# Patient Record
Sex: Male | Born: 1970 | Race: White | Hispanic: No | Marital: Married
Health system: Midwestern US, Community
[De-identification: ages and names within clinical notes are randomized; demographics above are authoritative.]

## PROBLEM LIST (undated history)

## (undated) DIAGNOSIS — F259 Schizoaffective disorder, unspecified: Secondary | ICD-10-CM

## (undated) DIAGNOSIS — F258 Other schizoaffective disorders: Secondary | ICD-10-CM

## (undated) DIAGNOSIS — E119 Type 2 diabetes mellitus without complications: Secondary | ICD-10-CM

## (undated) DIAGNOSIS — I10 Essential (primary) hypertension: Secondary | ICD-10-CM

## (undated) DIAGNOSIS — Z1211 Encounter for screening for malignant neoplasm of colon: Secondary | ICD-10-CM

## (undated) DIAGNOSIS — N400 Enlarged prostate without lower urinary tract symptoms: Secondary | ICD-10-CM

## (undated) DIAGNOSIS — K219 Gastro-esophageal reflux disease without esophagitis: Secondary | ICD-10-CM

## (undated) DIAGNOSIS — W19XXXA Unspecified fall, initial encounter: Secondary | ICD-10-CM

## (undated) DIAGNOSIS — Z9884 Bariatric surgery status: Secondary | ICD-10-CM

## (undated) DIAGNOSIS — N401 Enlarged prostate with lower urinary tract symptoms: Secondary | ICD-10-CM

## (undated) DIAGNOSIS — N289 Disorder of kidney and ureter, unspecified: Secondary | ICD-10-CM

## (undated) DIAGNOSIS — C642 Malignant neoplasm of left kidney, except renal pelvis: Secondary | ICD-10-CM

## (undated) DIAGNOSIS — R911 Solitary pulmonary nodule: Secondary | ICD-10-CM

## (undated) DIAGNOSIS — N2889 Other specified disorders of kidney and ureter: Secondary | ICD-10-CM

## (undated) HISTORY — DX: Essential (primary) hypertension: I10

## (undated) HISTORY — DX: Schizoaffective disorder, unspecified: F25.9

## (undated) HISTORY — DX: Type 2 diabetes mellitus without complications: E11.9

## (undated) HISTORY — DX: Other schizoaffective disorders: F25.8

## (undated) HISTORY — PX: OTHER SURGICAL HISTORY: SHX169

## (undated) HISTORY — PX: GASTRIC BYPASS: SHX52

---

## 2006-07-02 ENCOUNTER — Other Ambulatory Visit: Payer: Self-pay

## 2006-07-02 ENCOUNTER — Inpatient Hospital Stay: Payer: Self-pay | Admitting: Internal Medicine

## 2006-07-16 ENCOUNTER — Emergency Department: Payer: Self-pay | Admitting: Emergency Medicine

## 2006-08-19 ENCOUNTER — Other Ambulatory Visit: Payer: Self-pay

## 2006-08-19 ENCOUNTER — Emergency Department: Payer: Self-pay | Admitting: Emergency Medicine

## 2006-08-26 ENCOUNTER — Emergency Department: Payer: Self-pay | Admitting: Emergency Medicine

## 2007-02-09 ENCOUNTER — Emergency Department: Payer: Self-pay | Admitting: Emergency Medicine

## 2007-05-04 ENCOUNTER — Emergency Department: Payer: Self-pay | Admitting: Unknown Physician Specialty

## 2007-12-02 ENCOUNTER — Emergency Department: Payer: Self-pay | Admitting: Emergency Medicine

## 2008-05-29 ENCOUNTER — Emergency Department: Payer: Self-pay | Admitting: Emergency Medicine

## 2012-02-26 ENCOUNTER — Observation Stay: Payer: Self-pay | Admitting: Internal Medicine

## 2012-02-26 LAB — CBC
HCT: 41.1 % (ref 40.0–52.0)
HGB: 14 g/dL (ref 13.0–18.0)
MCH: 29.3 pg (ref 26.0–34.0)
MCHC: 34 g/dL (ref 32.0–36.0)
Platelet: 165 10*3/uL (ref 150–440)
RBC: 4.78 10*6/uL (ref 4.40–5.90)
RDW: 13.9 % (ref 11.5–14.5)
WBC: 7.1 10*3/uL (ref 3.8–10.6)

## 2012-02-26 LAB — COMPREHENSIVE METABOLIC PANEL
Albumin: 4.2 g/dL (ref 3.4–5.0)
Alkaline Phosphatase: 111 U/L (ref 50–136)
Anion Gap: 8 (ref 7–16)
Calcium, Total: 9.1 mg/dL (ref 8.5–10.1)
Co2: 25 mmol/L (ref 21–32)
Creatinine: 0.6 mg/dL (ref 0.60–1.30)
EGFR (Non-African Amer.): >60
Potassium: 3.8 mmol/L (ref 3.5–5.1)
Sodium: 139 mmol/L (ref 136–145)

## 2012-02-26 LAB — CK TOTAL AND CKMB (NOT AT ARMC)
CK, Total: 71 U/L (ref 35–232)
CK-MB: <0.5 ng/mL — ABNORMAL LOW (ref 0.5–3.6)

## 2012-02-26 LAB — TROPONIN I: Troponin-I: <0.02 ng/mL

## 2012-02-26 LAB — PROTIME-INR
INR: 1
Prothrombin Time: 13.1 secs (ref 11.5–14.7)

## 2013-02-22 ENCOUNTER — Emergency Department: Payer: Self-pay | Admitting: Emergency Medicine

## 2013-02-22 LAB — BASIC METABOLIC PANEL
BUN: 8 mg/dL (ref 7–18)
Calcium, Total: 8.6 mg/dL (ref 8.5–10.1)
Chloride: 104 mmol/L (ref 98–107)
Creatinine: 0.73 mg/dL (ref 0.60–1.30)
EGFR (African American): >60
EGFR (Non-African Amer.): >60
Glucose: 112 mg/dL — ABNORMAL HIGH (ref 65–99)
Potassium: 3.3 mmol/L — ABNORMAL LOW (ref 3.5–5.1)
Sodium: 139 mmol/L (ref 136–145)

## 2013-02-22 LAB — CK TOTAL AND CKMB (NOT AT ARMC)
CK, Total: 46 U/L (ref 35–232)
CK-MB: <0.5 ng/mL — ABNORMAL LOW (ref 0.5–3.6)

## 2013-02-22 LAB — CBC
HCT: 38 % — ABNORMAL LOW (ref 40.0–52.0)
HGB: 13 g/dL (ref 13.0–18.0)
MCV: 86 fL (ref 80–100)
Platelet: 153 10*3/uL (ref 150–440)
RBC: 4.42 10*6/uL (ref 4.40–5.90)
RDW: 14.4 % (ref 11.5–14.5)
WBC: 7.7 10*3/uL (ref 3.8–10.6)

## 2013-06-20 LAB — D-DIMER, QUANTITATIVE: D-Dimer, Quant: 0.95 ug/mL (FEU) — ABNORMAL HIGH (ref 0.01–0.50)

## 2013-06-20 LAB — CBC WITH AUTOMATED DIFF
BASOPHILS: 0.2 % (ref 0–3)
EOSINOPHILS: 5.5 % — ABNORMAL HIGH (ref 0–5)
HCT: 42.7 % (ref 37.0–50.0)
HGB: 14.3 gm/dl (ref 12.4–17.2)
IMMATURE GRANULOCYTES: 0.2 % (ref 0.0–3.0)
LYMPHOCYTES: 15.4 % — ABNORMAL LOW (ref 28–48)
MCH: 28.3 pg (ref 23.0–34.6)
MCHC: 33.5 gm/dl (ref 30.0–36.0)
MCV: 84.6 fL (ref 80.0–98.0)
MONOCYTES: 5.3 % (ref 1–13)
MPV: 10.9 fL — ABNORMAL HIGH (ref 6.0–10.0)
NEUTROPHILS: 73.4 % — ABNORMAL HIGH (ref 34–64)
NRBC: 0 (ref 0–0)
PLATELET: 186 10*3/uL (ref 140–450)
RBC: 5.05 M/uL (ref 3.80–5.70)
RDW-SD: 45.1 — ABNORMAL HIGH (ref 35.1–43.9)
WBC: 9.3 10*3/uL (ref 4.0–11.0)

## 2013-06-20 LAB — METABOLIC PANEL, COMPREHENSIVE
ALT (SGPT): 21 U/L (ref 12–78)
AST (SGOT): 13 U/L — ABNORMAL LOW (ref 15–37)
Albumin: 4.3 gm/dl (ref 3.4–5.0)
Alk. phosphatase: 132 U/L — ABNORMAL HIGH (ref 45–117)
BUN: 14 mg/dl (ref 7–25)
Bilirubin, total: 0.7 mg/dl (ref 0.2–1.0)
CO2: 26 mEq/L (ref 21–32)
Calcium: 9 mg/dl (ref 8.5–10.1)
Chloride: 104 mEq/L (ref 98–107)
Creatinine: 0.9 mg/dl (ref 0.6–1.3)
GFR est AA: >60
GFR est non-AA: >60
Glucose: 113 mg/dl — ABNORMAL HIGH (ref 74–106)
Potassium: 3.7 mEq/L (ref 3.5–5.1)
Protein, total: 7.9 gm/dl (ref 6.4–8.2)
Sodium: 140 mEq/L (ref 136–145)

## 2013-06-20 LAB — TROPONIN I: Troponin-I: <0.015 ng/ml (ref 0.00–0.09)

## 2013-06-20 LAB — D DIMER: D DIMER: 0.95 ug/mL (FEU) — ABNORMAL HIGH (ref 0.01–0.50)

## 2013-06-20 LAB — CKMB PROFILE
CK - MB: <0.5 ng/ml (ref 0.0–3.6)
CK-MB Index: 0.9 % (ref 0.0–4.9)
CK: 43 U/L (ref 39–308)

## 2013-06-20 LAB — LIPASE: Lipase: 248 U/L (ref 73–393)

## 2013-06-28 NOTE — ED Provider Notes (Addendum)
[facility]  EMERGENCY DEPARTMENT TREATMENT REPORT  NAME:  Jordan Weiss  SEX:   M  ADMIT: 06/20/2013  DOB:   January 27, 1971  MR#    [id number]  ROOM:    TIME DICTATED: 12 19 PM  ACCT#  [id number]        CHIEF COMPLAINT:  Epigastric abdominal pain, chest pain.    HISTORY OF PRESENT ILLNESS:  A 43 year old male presents with complaints of epigastric abdominal pain  radiating up into his chest, started yesterday.  The patient does have some  nausea, denies vomiting or diarrhea.  The patient complaining of lower back  pain as well.  States that he fell this morning secondary to the pain and that  lead to his back pain.  The patient states the pain has been constant since  onset and sharp.  No fevers or chills, no shortness of breath.    REVIEW OF SYSTEMS:  CONSTITUTIONAL:  Denies fevers or chills.  VISUAL:  No visual complaints.  ENT:  No congestion.  RESPIRATORY:  No shortness of breath.  CARDIOVASCULAR:  Precordial chest pain.  GASTROINTESTINAL:  Epigastric abdominal pain.  MUSCULOSKELETAL:  Mid to lower back pain.  NEUROLOGICAL:  No weakness or numbness.  Denies complaints in all other systems.    PAST MEDICAL HISTORY:  Degenerative disk disease, hypertension.    PAST SURGICAL HISTORY:  Skin grafts, gastric bypass surgery.    SOCIAL HISTORY:  Smokes cigarettes.    MEDICATIONS:  Reviewed on Ibex.    ALLERGIES:  HEPARIN.    PHYSICAL EXAMINATION:  GENERAL:  Well-developed male, awake, alert.  VITAL SIGNS:  Blood pressure 179/95, heart rate 116, respiratory rate 20,  temperature 97.5, oxygen saturation 100% on room air.  HEENT:  Anicteric sclerae.  Nasopharynx unremarkable.  Oropharynx clear.  NECK:  Supple.  LUNGS:  Clear to auscultation.  CARDIOVASCULAR:  Regular rate and rhythm.  The patient does have some mild  lower anterior chest discomfort.  ABDOMEN:  Soft, tender in the epigastrium without palpable masses, rebound or  guarding.  The rest of the abdomen is soft and nontender.   BACK:  Lumbar paraspinal tenderness.  No palpable bony abnormalities.  NEUROLOGIC:  Sensation and motor intact throughout all extremities.  Reflexes  equal and symmetric.  Motor strength is global throughout.  SKIN:  Unremarkable for acute abnormalities.     IMPRESSION:  Epigastric abdominal pain, chest pain.  We will obtain diagnostic studies.    DIAGNOSTIC STUDIES:  EKG:  Sinus bradycardia, no acute ST changes.  CT abdomen and pelvis obtained  revealing gastric bypass procedure, hepatic steatosis, bilateral renal cysts.  Chest x-ray obtained.  No acute abnormalities.  CPK profile is unremarkable.  CMP is unremarkable.  Troponin and CPK profile unremarkable.  D-dimer did come  back elevated at 0.95. Lipase is normal.  CBC is unremarkable.      The patient receiving intravenous fluids in the Emergency Department and  symptomatic treatment with morphine and Zofran.  On reevaluation, the patient  does feel much better.  He is comfortable in the Emergency Department.  I have  discussed the elevated D-dimer and need for further evaluation including CT  scan of his chest in light of the chest pain of unclear etiology and elevated  at the D-dimer.  I explained to him the risk of possible pulmonary embolism  and possible life-threatening complications related to this.  The patient  refuses to stay in the hospital for further evaluation.  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  I did discuss with him the need to follow  up as closely as possible for recheck, that he could always return to the  Emergency Department.  I gave him a referral to primary care, Dr. Annamaria Boots, as  well as a couple of GI groups, instructed him to watch his diet, avoid fatty  greasy foods and once again reiterated the need for him to have a CT of his   chest and further evaluate to rule out pulmonary embolism.    FINAL DIAGNOSES:  Abdominal pain evaluation, chest pain evaluation, elevated D-dimer.         ___________________  Joice Lofts MD  Dictated By: Marland Kitchen     My signature above authenticates this document and my orders, the final  diagnosis (es), discharge prescription (s), and instructions in the [facility] record.  Nursing notes have been reviewed by the physician/mid-level provider.    If you have any questions please contact [phone].    [facility]  D:06/28/2013 12:19:25  T: 06/28/2013 13:14:51  [id number]  Electronically Authenticated and Alison Stalling by:  Joya Gaskins. Sonny Masters, M.D. On 11/01/2013 09:23 AM EDT

## 2013-07-28 NOTE — ED Provider Notes (Signed)
[facility]  EMERGENCY DEPARTMENT TREATMENT REPORT  NAME:  Jordan Weiss  SEX:   M  ADMIT: 07/27/2013  DOB:   [birth year]  MR#    [id number]  ROOM:  HY86  TIME DICTATED: 02 9 AM  ACCT#  [id number]        PRIMARY CARE PHYSICIAN:  None.    TIME OF EVALUATION:  2201.    CHIEF COMPLAINT:  Chest pain.    HISTORY OF PRESENT ILLNESS:  The patient is a 43 year old male who presents with 18 hours of the anterior   left-sided sharp chest pain that radiates to his left shoulder.  It has been a   fairly constant pain since 12 to 18 hours prior to arrival.  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    States it has been 9 years since he has had an MI. Also he has not taken his   medications for the last 3 days.    REVIEW OF SYSTEMS:  CONSTITUTIONAL:  No fever, chills, or weight loss  EYES: No visual symptoms.  ENT: No sore throat, runny nose, or other URI symptoms.  ENDOCRINE: No diabetic symptoms.  HEMATOLOGIC/LYMPHATIC: No excessive bruising or lymph node swelling.  RESPIRATORY:  The patient does admit to shortness of breath accompanying his   chest pain.  CARDIOVASCULAR:  He has a left sternal border left anterior chest pain, sharp   in nature, radiating to left shoulder and down left arm times 12 to 18 hours   prior to arrival, fairly constant.  GASTROINTESTINAL:  Also he has nausea, no vomiting, no diarrhea.  GENITOURINARY: No dysuria, frequency, or urgency.  MUSCULOSKELETAL: No joint pain or swelling.  INTEGUMENTARY: No rashes.  NEUROLOGICAL: No headaches, sensory or motor symptoms.    PAST MEDICAL HISTORY:  Significant degenerative disk disease, hypertension, gastric bypass plastic   surgery to his hands.    SOCIAL HISTORY:  Smokes tobacco, consumes alcohol socially, denies drug use.  Lives at home.    FAMILY HISTORY:  Noncontributory.    CURRENT MEDICATIONS:  Reviewed in [facility].     ALLERGIES:  REVIEWED IN IBEX,    PHYSICAL EXAMINATION:  VITAL SIGNS:  Blood pressure 170/92, pulse 65, respirations 18, temperature   97.5, pain 10, O2 sat 100% on room air.  GENERAL APPEARANCE: Patient appears well developed and well nourished.   Appearance and behavior are age and situation appropriate.  HEENT:  Eyes:  Conjunctivae are mildly injected.  Pupils equal, symmetrical   and normally reactive. Mouth/throat: Surfaces of the pharynx, palate, and   tongue are pink, moist, and without lesions.   NECK:  Symmetrical.    RESPIRATORY: Clear and equal breath sounds. No respiratory distress,   tachypnea, or accessory muscle use.  CARDIOVASCULAR:  Heart is regular.  I did not auscultate any murmurs or rubs.    DP pulses 2+ and equal bilaterally. No peripheral edema or significant   varicosities.   CHEST:  Symmetrical without masses or tenderness.  NECK:  Symmetrical, mildly tender to palpation anteriorly both left and right.      ABDOMEN: Soft, nondistended, nontender.  No rebound or peritoneal signs.  No   flank tenderness.  MUSCULOSKELETAL:  Stance and gait appear normal.  SKIN:  Warm and dry without rashes.  NEUROLOGIC:  He is alert, oriented.  Motor strength equal  and symmetric.    INITIAL ASSESSMENT AND MANAGEMENT PLAN:  This is a new problem for the patient.     Nursing  notes were reviewed.    IMPRESSION/PLAN:    Patient with chest pain. Acute ischemic coronary disease must be considered   first, and the patient protected against the consequences of same, while other   etiologies (including infectious, pulmonary, gastrointestinal, and   musculoskeletal) are considered.     COURSE IN THE EMERGENCY ROOM:  EKG read Dr. Rigoberto Noel did not see any acute S-T segment or T wave abnormalities   that are consistent with acute ischemia or infarction.    Chest x-ray read by Dr. Rigoberto Noel, no acute pulmonary process.      He was given sublingual nitroglycerin and topical nitro here in the ER.  He    was given p.o. aspirin, IV Pepcid, IV Zofran, IV morphine, IV Tylenol.  His   CPK profile was normal.  Troponin I was normal, that was 2 sets, all normal.    Basic metabolic panel was unremarkable and CBC also unremarkable.    DIAGNOSIS:  Chest pain, precordial.  He was assigned to the observation unit for chest   pain protocol; however, at 230 apparently there was some sort of crisis going   on at home, so he is being discharged AMA from the observation unit, not   having completed his chest pain protocol.      EXAMINATION: Vital signs at 117 prior to discharge, blood pressure 157/93,   pulse 61, respirations 18, temperature 97.7, pain 3, O2 saturation 97% on room   air.    HEART: Regular, no murmurs or rubs.  CHEST:  Symmetrical, nontender.  ABDOMEN:  Soft, nondistended, nontender.    The patient was personally evaluated by myself and Dr. Rigoberto Noel, who agrees with   the above assessment and plan.    CONTINUATION BY Jordan Crumble, MD    The patient was seen with Jordan Organ, PA-C.  We have gone over the history and   physical exam together.      I went and saw and examined the patient and spoke to him myself.  He is 43 and   presents with precordial pain.  Agree with her management and plan.  He will   be placed in the observation unit for further testing and management.      ___________________  Jordan Weiss M.D.  Dictated By: Kelli Hope. Towns, PA-C    My signature above authenticates this document and my orders, the final  diagnosis (es), discharge prescription (s), and instructions in the [facility] record.  Nursing notes have been reviewed by the physician/mid-level provider.    If you have any questions please contact [phone].    [facility]  D:07/28/2013 02:39:51  T: 07/28/2013 10:56:34  [id number]  Authenticated by Hortense Ramal, M.D. On 08/11/2013 04:01:06 AM

## 2013-08-12 NOTE — ED Provider Notes (Signed)
[facility]  EMERGENCY DEPARTMENT TREATMENT REPORT  NAME:  Jordan Weiss  SEX:   M  ADMIT: 08/12/2013  DOB:   1971-06-07  MR#    [id number]  ROOM:  CD06  TIME DICTATED: 10 38 AM  ACCT#  [id number]    cc: Dr. Pablo Ledger     I hereby certify this patient for admission, based on medical necessity, as  noted below.    TIME OF EVALUATION:  0930    CHIEF COMPLAINT:  Suicidal ideation with possible overdose of trazodone and lithium.    PRIMARY CARE PHYSICIAN:   Dr. Pablo Ledger    HISTORY OF PRESENT ILLNESS:  This is a 43 year old male who tells me that he took an overdose of trazodone  and lithium,  approximately 20 trazodone, 100 mg each, and between 15 and 20   of  150 mg lithium approximately an hour and a half to 2 hours ago.  He was  actually brought in by a bail bondsman who had discovered him behind the  [employer] right across the street from the hospital and was bringing him in   on  a bail from back in September timeframe.  He states that when the patient got  into the car, he told the agent that he had taken the medication and so the  agent brought him over here for evaluation.  He denies taking any other  medications and did not drink any alcohol.  He says that he has had suicidal  ideation since he was 43 years old and psychiatric problems that he has been  treated for.  He has not been taking his medications, however, for the past  month.  He also has a history of hypertension and diabetes and has not taken  medication for that either.  During my evaluation, he asked me 3 or 4 times if  I would just let him die.  He otherwise denies any fevers.  No cough, no chest  pain or shortness of breath, no abdominal pain, no nausea, no vomiting or  diarrhea.    REVIEW OF SYSTEMS.  CONSTITUTIONAL:  No fever, chills, or weight loss.  EYES:   No visual symptoms.  ENT:  No sore throat, runny nose, or other URI symptoms.  ENDOCRINE:  No diabetic symptoms.   HEMATOLOGIC/LYMPHATIC:   No excessive bruising or lymph node swelling.  ALLERGIC/IMMUNOLOGIC:  No urticaria or allergy symptoms.  RESPIRATORY:  No cough, shortness of breath, or wheezing.  CARDIOVASCULAR:  No chest pain, chest pressure, or palpitations.  GASTROINTESTINAL:  No vomiting, diarrhea, or abdominal pain.  GENITOURINARY:  No dysuria, frequency, or urgency.  MUSCULOSKELETAL:  No joint pain or swelling.  INTEGUMENTARY:  No rashes.  NEUROLOGICAL:  No headaches, sensory or  motor  symptoms.    PSYCHIATRIC:  Positive for suicidal ideation.    PAST MEDICAL HISTORY:  Includes MI, DJD, hypertension, diabetes, suicidal ideation.  No pertinent  surgical history.    PSYCHIATRIC HISTORY:    Schizoaffective, bipolar.    SOCIAL HISTORY:    The patient smokes cigarettes.  Denies drinking alcohol or any illicit drug   use.    ALLERGIES:    HEPARIN.    MEDICATIONS:  Not currently taking.    PHYSICAL EXAMINATION:  VITAL SIGNS:  Blood pressure initially 219/89, pulse 76, respirations 18,  temperature 97.8, satting 100% on room air.    CONSTITUTIONAL:   A well-developed, well-nourished-appearing 44 year old  gentleman who does not appear in acute distress.  He is cooperative  with my  evaluation to a certain extent.  HEENT:  Eyes:  Conjunctivae clear, lids normal.  Pupils equal, symmetrical,   and  normally reactive.  Mouth/Throat:  Surfaces of the pharynx, palate, and tongue  are pink, moist, and without lesions.    RESPIRATORY:  Clear and equal breath sounds.  No respiratory distress,  tachypnea, or accessory muscle use.    CARDIOVASCULAR:   Heart regular, without murmurs, gallops, rubs, or thrills.   DP pulses 2+ and equal bilaterally.  No peripheral edema or significant  varicosities.    GASTROINTESTINAL:  Abdomen soft, nontender, without complaint of pain to  palpation.  No hepatomegaly or splenomegaly.  MUSCULOSKELETAL:  Stance and gait appear normal.  SKIN:  Warm and dry without rashes.     NEUROLOGIC:  The patient is alert and oriented times 3.  Sensation is intact.   He is not cooperative when I attempt to do his motor strength, he just lets   his  arms and legs fall to the bed loosely.  There is no facial asymmetry or  dysarthria that I can detect.  However, he is not cooperative with that  evaluation as well, but I do notice any during my questioning of him.  He is  not cooperative as I attempt to evaluate his cranial nerves II through XII.   However, again, the patient is able to converse in a normal voice and tracks   my  movements as I complete my evaluation, but when I ask him to do specific   things  he does not cooperate.      CONTINUATION BY DR. Harveer Sadler TSUCHITANI:    RESULTS:   EKG showing a sinus rhythm.  No ischemic changes.  X-ray of the chest:  No  acute process read by Dr. Posey Pronto.  CK profile and troponin negative.  Alcohol  negative.  Lithium negative.  Salicylate 3.7.  CMP:  Alkaline phosphatase 122,  otherwise negative.  Acetaminophen  negative.  Urine drugs screen negative.   CBC unremarkable.  Urine dip negative.      EMERGENCY ROOM COURSE:  The patient was brought in by a bail bondsman and he reported that he took an  overdose about 2 hours prior to being picked up by the bail bondsman so now it  is a little bit later than that.  He states it was lithium and trazodone.  He  is not sleepy at all.  He is awake.  He is answering questions appropriately.  He also is  complaining of chest pain that radiates to his left arm.  He said  it has been intermittent since he was here on 02/11.  At that time he was  placed in ED observation, but left AMA without a stress test.  He tells me   that  5 years ago he had a catheterization and was told he had a heart attack  but  is not on any kind of Plavix and did not have a stent.  He has also not been  taking his blood pressure medicine.  He was discussed with Poison Control who   recommended monitoring and rechecking lithium level at 2:00 and 6:00 and also  rechecking the aspirin, Tylenol at 2:00.  They recommended supportive care and  fluids, but had no other specific recommendations at this time other than  monitoring.  The patient has been down here for almost 5 hours, is not sleepy.     He will need a cardiac rule  out given his history so I have spoken to Dr.  Trinidad Curetatarkina, who has accepted him to observation telemetry.  I have spoken to   the  charge nurse, Verlene MayerKen Amilio, who will notify the nursing supervisor that the  patient needs a sitter.  The bail bondsman apparently is planning to stay with  him and I have also let Dr. Trinidad Curetatarkina know that because of the overdose, the  patient complaints of suicidal ideation, he will need to be seen by psychiatry  before he is discharged from the hospital and he understands.  This was  communicated to the patient who was happy with the plan for admission.  He   says  he wants to get his heart checked out and understands he will not be able to  leave until he is both medically and psychiatrically cleared.     DIAGNOSES:  1.  Chest pain.   2.  Reported overdose of lithium and trazodone.      Please note that at this time we are pending repeat aspirin and Tylenol   levels,  salicylate as was not completely negative.  He does deny taking any other  overdose, but I am going to hold off on the aspirin at this time until we see  the repeat levels.  The patient is admitted to observation telemetry needing a  sitter.      CONTINUATION BY  Sharlena Kristensen TSUCHITANI, M.D.     LAB:  Repeat lithium  negative. Acetaminophen negative. Salicylate 3.4      ER COURSE:  There is a bail bondsman from [place] that showed up and was really wanting   to  take the patient AMA  out of the hospital. I  explained that he is not  medically cleared.  Poison Control had recommended repeat labs at 0200 and  0600, after which the  patient would need to be seen by Psychiatry because he   reported overdosing. He will also need chest pain evaluation. The bail   bondsman reported that there is, apparently, a history of this with the   patient using the hospital to try to get out of court dates. However, we have   to take the patient seriously, being that he is reporting an overdose, so I   explained that he cannot sign him out AMA, that he had already been admitted   and this had already been discussed with the bail bondsman from [place].  I talked about this with the new one from [place].      ___________________  Judeen HammansSara N Tsuchitani MD  Dictated By: Fransisca KaufmannJeffrey Padgett, PA-C    My signature above authenticates this document and my orders, the final  diagnosis (es), discharge prescription (s), and instructions in the [facility] record.  Nursing notes have been reviewed by the physician/mid-level provider.    If you have any questions please contact [phone].    [facility]  D:08/12/2013 10:38:38  T: 08/12/2013 11:12:57  [id number]  Authenticated and Linus OrnEdited by Tana ConchSARA N. TSUCHITANI, MD On 08/22/13 4:43:52 PM

## 2013-11-20 IMAGING — CR DG CHEST 2V
1 series · 2 of 2 positions shown · non-contrast
Comparison: none

REASON FOR EXAM: CP
COMMENTS:

[Series 1: w chest pa · 0.14mm/px · 2 of 2 slices shown]
[im 1/2]
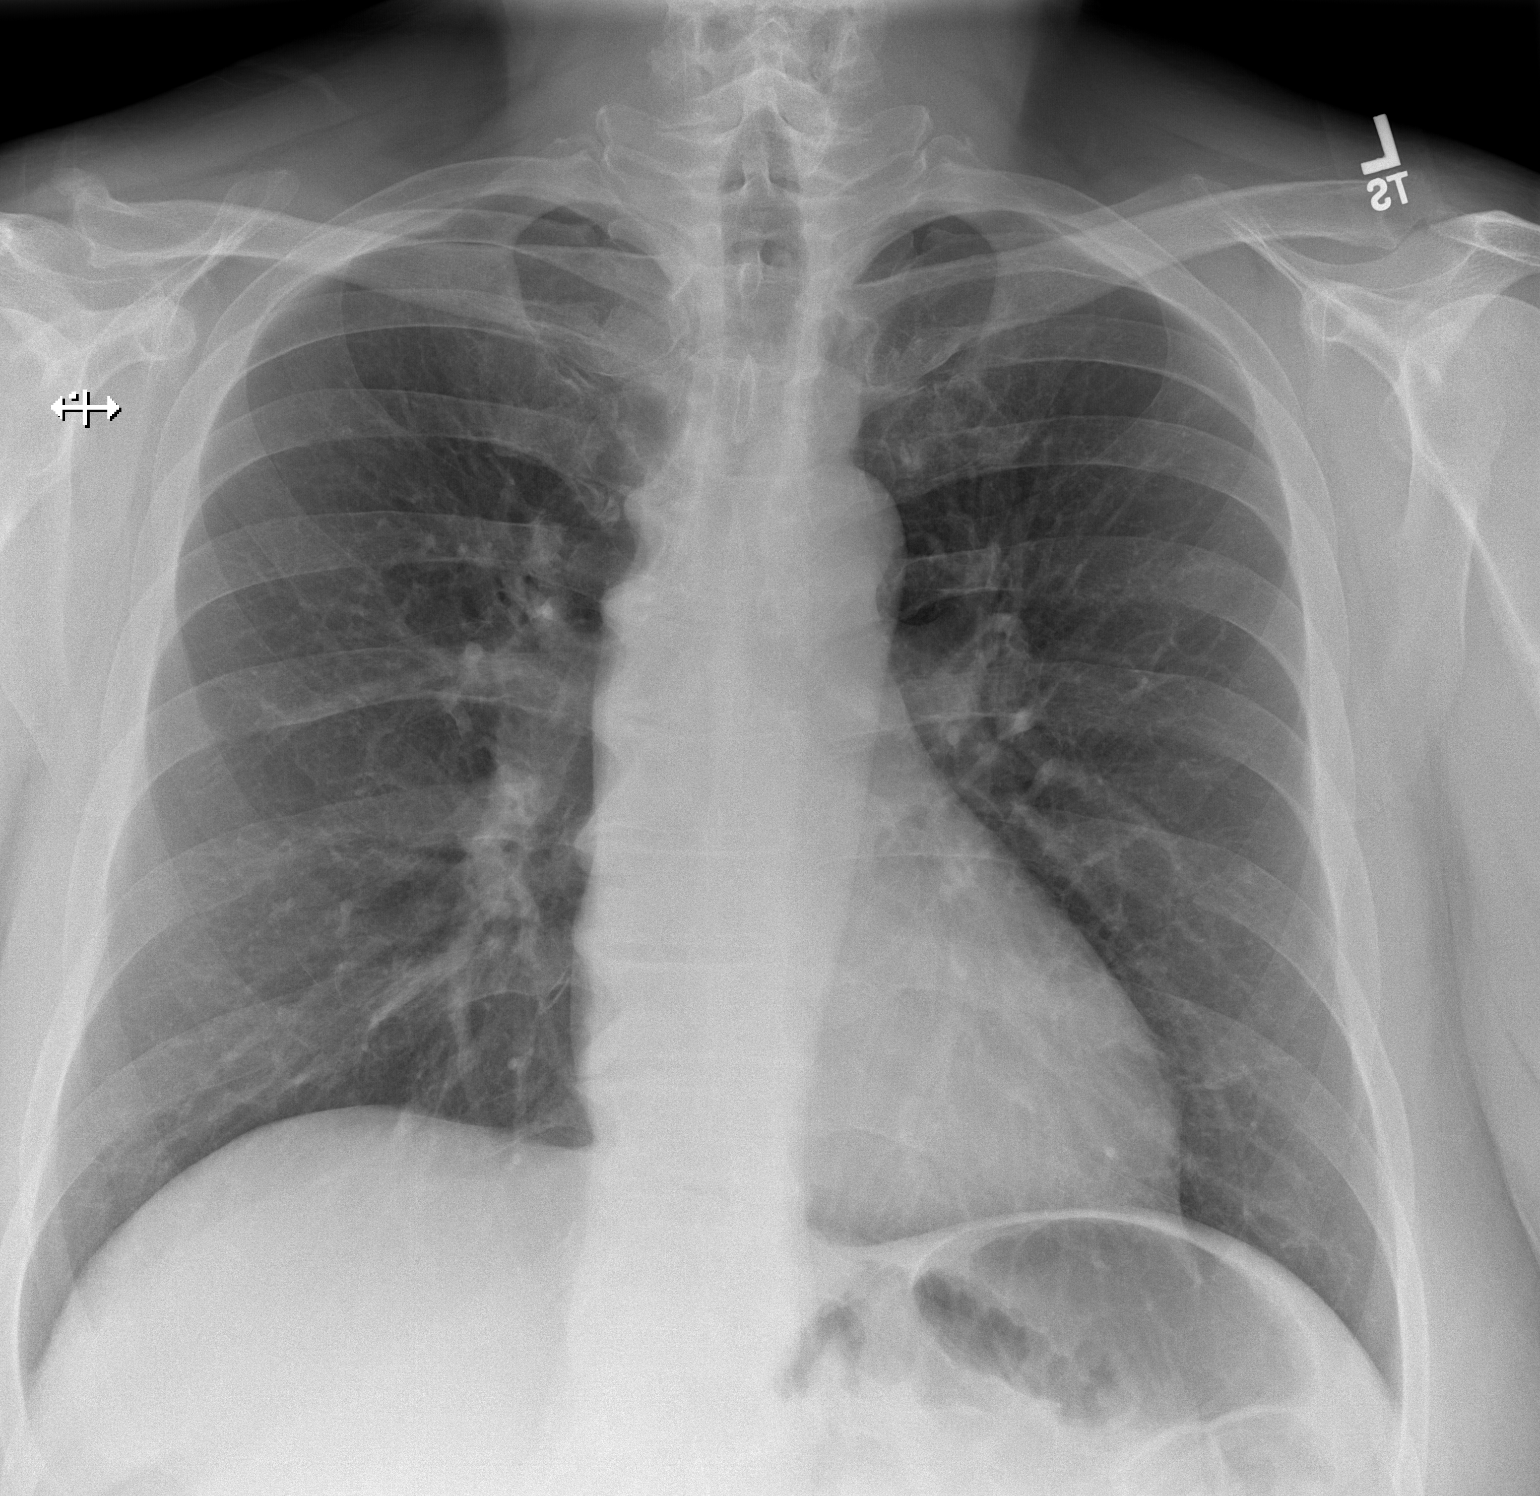
[im 2/2]
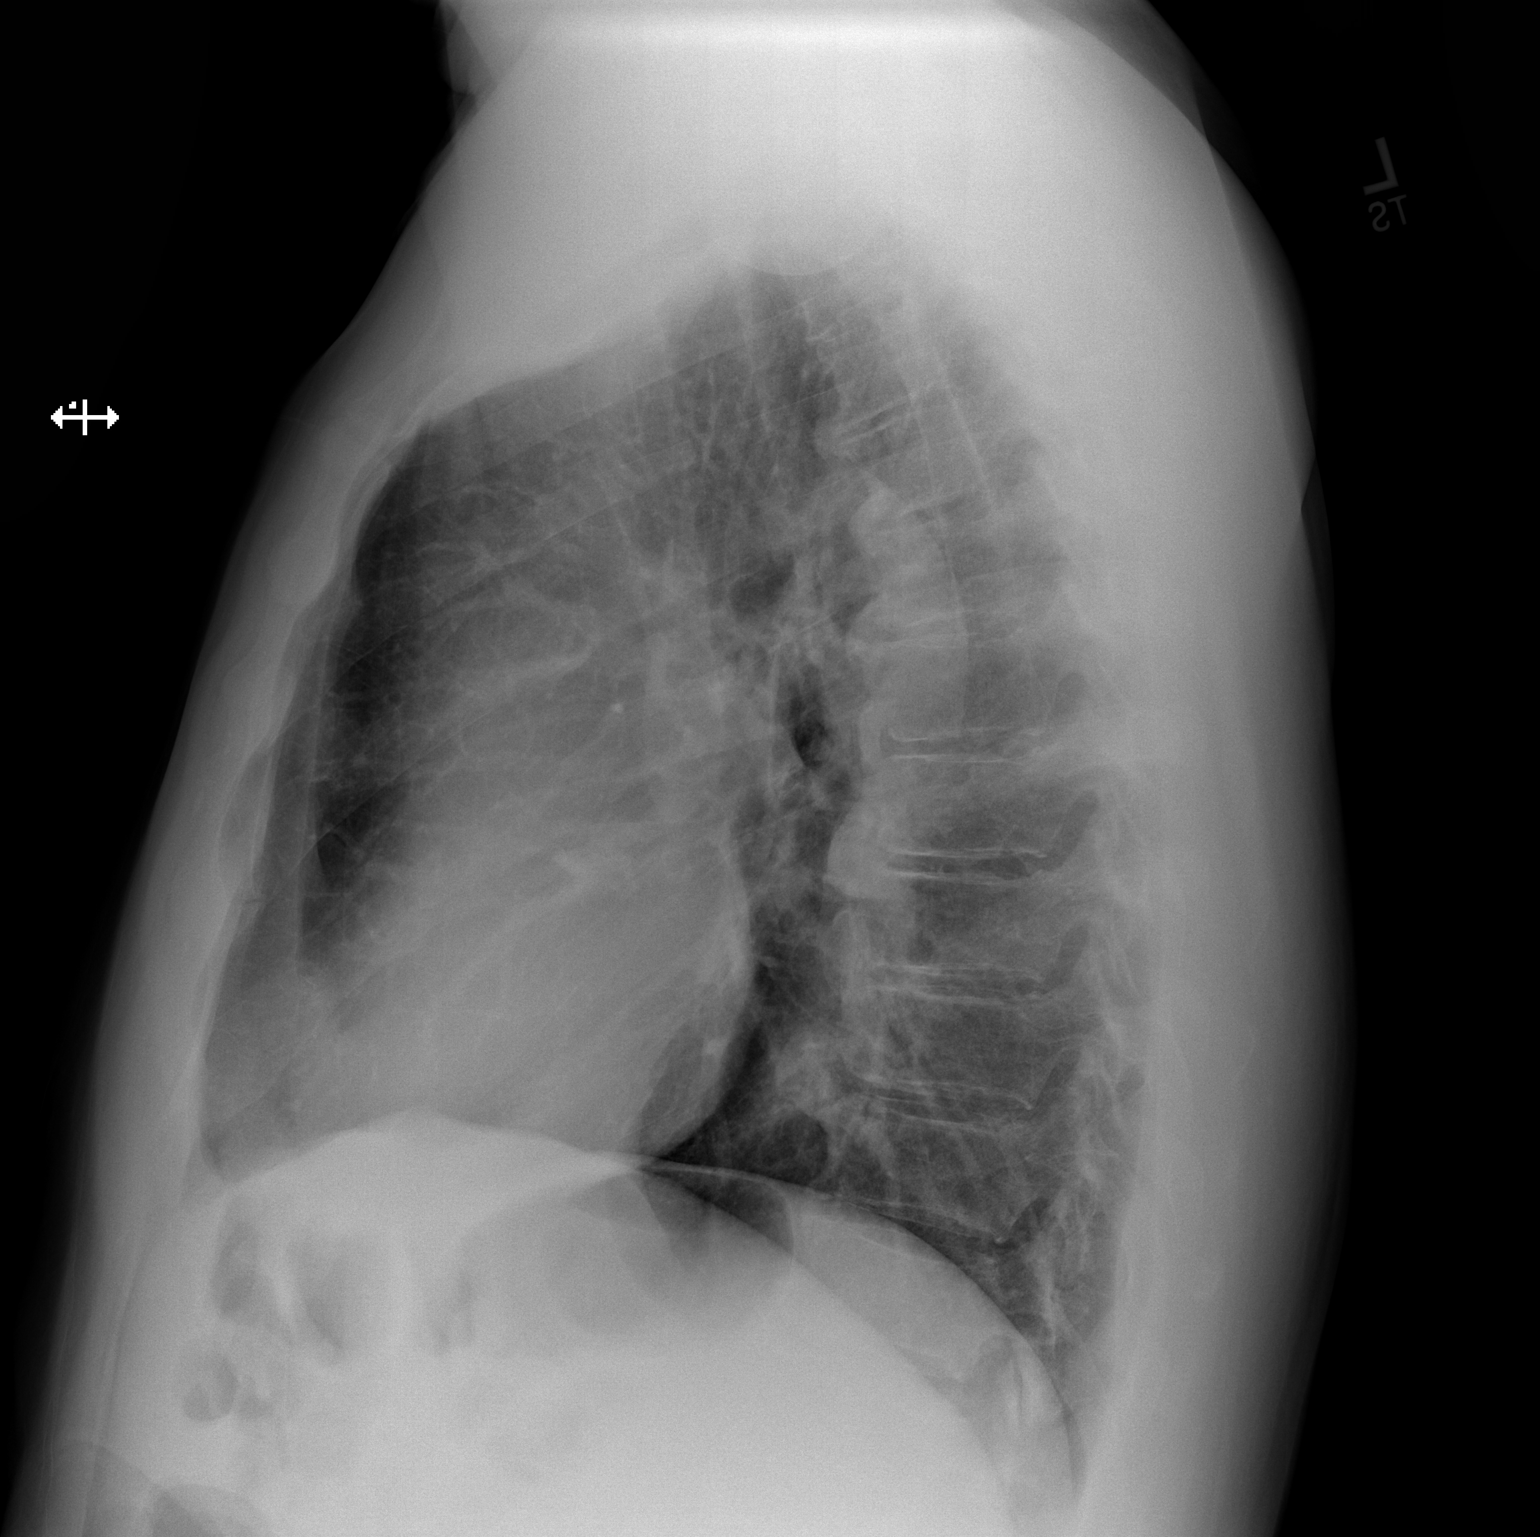

[2 of 2 positions shown; findings below may reference images not displayed]

PROCEDURE:     DXR - DXR CHEST PA (OR AP) AND LATERAL  - February 22, 2013  [DATE]

RESULT:     Comparison is made to the study February 26, 2012.

The lungs are borderline hyperinflated. There is no focal infiltrate. The
cardiac silhouette is normal in size. There is no pulmonary vascular
congestion. There is no pleural effusion or pneumothorax. The bony thorax
exhibits no acute abnormalities. Degenerative disc change of the thoracic
spine is present.
IMPRESSION: 1. Borderline hyperinflation may reflect underlying reactive airway disease
or COPD.
2. There is no evidence of CHF nor of pneumonia.

[REDACTED]

## 2013-12-07 ENCOUNTER — Telehealth: Payer: Self-pay | Admitting: Family Medicine

## 2013-12-07 NOTE — Telephone Encounter (Signed)
appt made for dr Hyacinth Meekermiller- 12/22/13

## 2013-12-22 ENCOUNTER — Ambulatory Visit (INDEPENDENT_AMBULATORY_CARE_PROVIDER_SITE_OTHER): Payer: Medicare Other | Admitting: Family Medicine

## 2013-12-22 ENCOUNTER — Other Ambulatory Visit: Payer: Self-pay | Admitting: *Deleted

## 2013-12-22 ENCOUNTER — Encounter (INDEPENDENT_AMBULATORY_CARE_PROVIDER_SITE_OTHER): Payer: Self-pay

## 2013-12-22 ENCOUNTER — Encounter: Payer: Self-pay | Admitting: Family Medicine

## 2013-12-22 VITALS — BP 130/82 | HR 58 | Temp 96.6°F | Ht 72.0 in | Wt 268.0 lb

## 2013-12-22 DIAGNOSIS — M5137 Other intervertebral disc degeneration, lumbosacral region: Secondary | ICD-10-CM

## 2013-12-22 DIAGNOSIS — E119 Type 2 diabetes mellitus without complications: Secondary | ICD-10-CM

## 2013-12-22 DIAGNOSIS — IMO0001 Reserved for inherently not codable concepts without codable children: Secondary | ICD-10-CM

## 2013-12-22 DIAGNOSIS — F259 Schizoaffective disorder, unspecified: Secondary | ICD-10-CM | POA: Insufficient documentation

## 2013-12-22 DIAGNOSIS — I10 Essential (primary) hypertension: Secondary | ICD-10-CM

## 2013-12-22 DIAGNOSIS — I2510 Atherosclerotic heart disease of native coronary artery without angina pectoris: Secondary | ICD-10-CM | POA: Insufficient documentation

## 2013-12-22 DIAGNOSIS — N400 Enlarged prostate without lower urinary tract symptoms: Secondary | ICD-10-CM | POA: Insufficient documentation

## 2013-12-22 DIAGNOSIS — E1165 Type 2 diabetes mellitus with hyperglycemia: Secondary | ICD-10-CM

## 2013-12-22 LAB — POCT GLYCOSYLATED HEMOGLOBIN (HGB A1C): Hemoglobin A1C: 4.9

## 2013-12-22 MED ORDER — CYCLOBENZAPRINE HCL 10 MG PO TABS: 10.0000 mg | ORAL_TABLET | Freq: Three times a day (TID) | ORAL | Status: AC | PRN

## 2013-12-22 MED ORDER — NITROGLYCERIN 0.4 MG SL SUBL: 0.4000 mg | SUBLINGUAL_TABLET | SUBLINGUAL | Status: AC | PRN

## 2013-12-22 MED ORDER — TAMSULOSIN HCL 0.4 MG PO CAPS: 0.4000 mg | ORAL_CAPSULE | Freq: Every day | ORAL | Status: AC

## 2013-12-22 MED ORDER — DILTIAZEM HCL ER 120 MG PO CP24: 120.0000 mg | ORAL_CAPSULE | Freq: Every day | ORAL | Status: AC

## 2013-12-22 MED ORDER — ENALAPRIL MALEATE 10 MG PO TABS: 10.0000 mg | ORAL_TABLET | Freq: Every day | ORAL | Status: DC

## 2013-12-22 MED ORDER — CLONIDINE HCL 0.1 MG PO TABS: 0.1000 mg | ORAL_TABLET | Freq: Every day | ORAL | Status: AC

## 2013-12-22 MED ORDER — HYDROCHLOROTHIAZIDE 25 MG PO TABS: 25.0000 mg | ORAL_TABLET | Freq: Every day | ORAL | Status: AC

## 2013-12-22 MED ORDER — ATENOLOL 25 MG PO TABS: 25.0000 mg | ORAL_TABLET | Freq: Every day | ORAL | Status: AC

## 2013-12-22 MED ORDER — ACETAMINOPHEN-CODEINE #3 300-30 MG PO TABS: 2.0000 | ORAL_TABLET | Freq: Three times a day (TID) | ORAL | Status: AC | PRN

## 2013-12-22 NOTE — Progress Notes (Signed)
   Subjective:    Patient ID: Andrew Grant, male    DOB: 10-Feb-1971, 43 y.o.   MRN: [id number]  HPI Pt moved here from [place] to be near sister.  Has multiple medical problems, including CAD, DM, BP, schizoaffective disorder.  Initailly wanted referral for pain management but only on Tylenol#3 and I am comfortable with use if that med.    Review of Systems  Constitutional: Positive for unexpected weight change (losing weight S/P gastric bypass).  HENT: Negative.   Eyes: Negative.   Respiratory: Negative.   Cardiovascular: Chest pain: uses NTG q 6 months with exertion.  Endocrine: Negative.   Genitourinary: Negative.   Musculoskeletal: Back pain: has had injections and "burning of nerves with laser"  Psychiatric/Behavioral: Nervous/anxious: history of schizoaffective and personality disorder; sees psych in [place] and managed meds there.        Objective:   Physical Exam  Vitals reviewed. Constitutional: He appears well-developed and well-nourished.  HENT:  Head: Normocephalic and atraumatic.  Eyes: Pupils are equal, round, and reactive to light.  Neck: Normal range of motion.  Cardiovascular: Normal rate, regular rhythm, normal heart sounds and intact distal pulses.   Pulmonary/Chest: Effort normal and breath sounds normal.  Abdominal: Soft. Bowel sounds are normal.  Musculoskeletal: He exhibits tenderness (lower back).  Neurological: He is alert.  Skin: Skin is warm and dry.  Psychiatric: He has a normal mood and affect. His behavior is normal. Judgment and thought content normal.   Results for orders placed in visit on 12/22/13  POCT GLYCOSYLATED HEMOGLOBIN (HGB A1C)      Result Value Ref Range   Hemoglobin A1C 4.9            Assessment & Plan:   1. Type 2 diabetes mellitus without complication A2Z returned in house suggests no diabetes present; will follow for now - POCT glycosylated hemoglobin (Hb A1C) - BMP8+EGFR - Lipid panel - POCT UA - Microalbumin  2.  Schizoaffective disorder, unspecified condition Followed by psych in [place]; I think this is a satisfactory plan  3. Essential hypertension, benign Refiil prior meds  4. Coronary atherosclerosis of unspecified type of vessel, native or graft Requests local cardioogy; referral made - Ambulatory referral to Cardiology  5. BPH (benign prostatic hyperplasia) Continue Flomax  6. Degeneration of lumbar or lumbosacral intervertebral disc This is the source of his pain; whatever was done by previous anesthesiologists seems to be effective; will taper Tylenol #3     I will manage this patient's back pain with tylenol #; he is intent on decreasing the dose and this is good.  His weight is declining post gastric bypass  His A1C today is at non-diabetic level and would not recommend treatment at this time Wardell Honour MD

## 2013-12-22 NOTE — Patient Instructions (Signed)
Diabetes and Exercise Exercising regularly is important. It is not just about losing weight. It has many health benefits, such as:  Improving your overall fitness, flexibility, and endurance.  Increasing your bone density.  Helping with weight control.  Decreasing your body fat.  Increasing your muscle strength.  Reducing stress and tension.  Improving your overall health. People with diabetes who exercise gain additional benefits because exercise:  Reduces appetite.  Improves the body's use of blood sugar (glucose).  Helps lower or control blood glucose.  Decreases blood pressure.  Helps control blood lipids (such as cholesterol and triglycerides).  Improves the body's use of the hormone insulin by:  Increasing the body's insulin sensitivity.  Reducing the body's insulin needs.  Decreases the risk for heart disease because exercising:  Lowers cholesterol and triglycerides levels.  Increases the levels of good cholesterol (such as high-density lipoproteins [HDL]) in the body.  Lowers blood glucose levels. YOUR ACTIVITY PLAN  Choose an activity that you enjoy and set realistic goals. Your health care provider or diabetes educator can help you make an activity plan that works for you. You can break activities into 2 or 3 sessions throughout the day. Doing so is as good as one long session. Exercise ideas include:  Taking the dog for a walk.  Taking the stairs instead of the elevator.  Dancing to your favorite song.  Doing your favorite exercise with a friend. RECOMMENDATIONS FOR EXERCISING WITH TYPE 1 OR TYPE 2 DIABETES   Check your blood glucose before exercising. If blood glucose levels are greater than 240 mg/dL, check for urine ketones. Do not exercise if ketones are present.  Avoid injecting insulin into areas of the body that are going to be exercised. For example, avoid injecting insulin into:  The arms when playing tennis.  The legs when  jogging.  Keep a record of:  Food intake before and after you exercise.  Expected peak times of insulin action.  Blood glucose levels before and after you exercise.  The type and amount of exercise you have done.  Review your records with your health care provider. Your health care provider will help you to develop guidelines for adjusting food intake and insulin amounts before and after exercising.  If you take insulin or oral hypoglycemic agents, watch for signs and symptoms of hypoglycemia. They include:  Dizziness.  Shaking.  Sweating.  Chills.  Confusion.  Drink plenty of water while you exercise to prevent dehydration or heat stroke. Body water is lost during exercise and must be replaced.  Talk to your health care provider before starting an exercise program to make sure it is safe for you. Remember, almost any type of activity is better than none. Document Released: 08/23/2003 Document Revised: 02/02/2013 Document Reviewed: 11/09/2012 [facility] Patient Information 2015 [place], [place]. This information is not intended to replace advice given to you by your health care provider. Make sure you discuss any questions you have with your health care provider. Back Pain, Adult Low back pain is very common. About 1 in 5 people have back pain.The cause of low back pain is rarely dangerous. The pain often gets better over time.About half of people with a sudden onset of back pain feel better in just 2 weeks. About 8 in 10 people feel better by 6 weeks.  CAUSES Some common causes of back pain include:  Strain of the muscles or ligaments supporting the spine.  Wear and tear (degeneration) of the spinal discs.  Arthritis.  Direct  injury to the back. DIAGNOSIS Most of the time, the direct cause of low back pain is not known.However, back pain can be treated effectively even when the exact cause of the pain is unknown.Answering your caregiver's questions about your overall  health and symptoms is one of the most accurate ways to make sure the cause of your pain is not dangerous. If your caregiver needs more information, he or she may order lab work or imaging tests (X-rays or MRIs).However, even if imaging tests show changes in your back, this usually does not require surgery. HOME CARE INSTRUCTIONS For many people, back pain returns.Since low back pain is rarely dangerous, it is often a condition that people can learn to [facility] their own.   Remain active. It is stressful on the back to sit or stand in one place. Do not sit, drive, or stand in one place for more than 30 minutes at a time. Take short walks on level surfaces as soon as pain allows.Try to increase the length of time you walk each day.  Do not stay in bed.Resting more than 1 or 2 days can delay your recovery.  Do not avoid exercise or work.Your body is made to move.It is not dangerous to be active, even though your back may hurt.Your back will likely heal faster if you return to being active before your pain is gone.  Pay attention to your body when you bend and lift. Many people have less discomfortwhen lifting if they bend their knees, keep the load close to their bodies,and avoid twisting. Often, the most comfortable positions are those that put less stress on your recovering back.  Find a comfortable position to sleep. Use a firm mattress and lie on your side with your knees slightly bent. If you lie on your back, put a pillow under your knees.  Only take over-the-counter or prescription medicines as directed by your caregiver. Over-the-counter medicines to reduce pain and inflammation are often the most helpful.Your caregiver may prescribe muscle relaxant drugs.These medicines help dull your pain so you can more quickly return to your normal activities and healthy exercise.  Put ice on the injured area.  Put ice in a plastic bag.  Place a towel between your skin and the bag.  Leave  the ice on for 15-20 minutes, 03-04 times a day for the first 2 to 3 days. After that, ice and heat may be alternated to reduce pain and spasms.  Ask your caregiver about trying back exercises and gentle massage. This may be of some benefit.  Avoid feeling anxious or stressed.Stress increases muscle tension and can worsen back pain.It is important to recognize when you are anxious or stressed and learn ways to manage it.Exercise is a great option. SEEK MEDICAL CARE IF:  You have pain that is not relieved with rest or medicine.  You have pain that does not improve in 1 week.  You have new symptoms.  You are generally not feeling well. SEEK IMMEDIATE MEDICAL CARE IF:   You have pain that radiates from your back into your legs.  You develop new bowel or bladder control problems.  You have unusual weakness or numbness in your arms or legs.  You develop nausea or vomiting.  You develop abdominal pain.  You feel faint. Document Released: 06/02/2005 Document Revised: 12/02/2011 Document Reviewed: 10/21/2010 [facility] Patient Information 2015 [place], [place]. This information is not intended to replace advice given to you by your health care provider. Make sure you discuss any  questions you have with your health care provider.  

## 2013-12-23 LAB — BMP8+EGFR
BUN / CREAT RATIO: 15 (ref 9–20)
BUN: 11 mg/dL (ref 6–24)
CO2: 28 mmol/L (ref 18–29)
Calcium: 9.1 mg/dL (ref 8.7–10.2)
Chloride: 102 mmol/L (ref 97–108)
Creatinine, Ser: 0.75 mg/dL — ABNORMAL LOW (ref 0.76–1.27)
GFR calc Af Amer: 128 mL/min/{1.73_m2} (ref >59)
GFR calc non Af Amer: 111 mL/min/{1.73_m2} (ref >59)
Glucose: 115 mg/dL — ABNORMAL HIGH (ref 65–99)
Potassium: 4.4 mmol/L (ref 3.5–5.2)
SODIUM: 141 mmol/L (ref 134–144)

## 2013-12-23 LAB — LIPID PANEL
Chol/HDL Ratio: 3 ratio units (ref 0.0–5.0)
Cholesterol, Total: 150 mg/dL (ref 100–199)
HDL: 50 mg/dL (ref >39)
LDL Calculated: 80 mg/dL (ref 0–99)
Triglycerides: 98 mg/dL (ref 0–149)
VLDL Cholesterol Cal: 20 mg/dL (ref 5–40)

## 2013-12-24 ENCOUNTER — Telehealth: Payer: Self-pay | Admitting: Cardiovascular Disease

## 2013-12-24 NOTE — Telephone Encounter (Signed)
Closed encounter °

## 2013-12-27 NOTE — Telephone Encounter (Signed)
Returning call for lab results.  Call him at [phone]

## 2013-12-28 ENCOUNTER — Encounter: Payer: Self-pay | Admitting: *Deleted

## 2014-01-02 ENCOUNTER — Telehealth: Payer: Self-pay | Admitting: Family Medicine

## 2014-01-02 NOTE — Telephone Encounter (Signed)
Patient aware Dr. Hyacinth MeekerMiller has no appts today

## 2014-01-30 ENCOUNTER — Encounter: Payer: Medicare Other | Admitting: Family Medicine

## 2014-01-30 NOTE — Progress Notes (Signed)
   Subjective:    Patient ID: Andrew Grant, male    DOB: 03/12/1971, 43 y.o.   MRN: [id number]  HPI    Review of Systems     Objective:   Physical Exam        Assessment & Plan:

## 2014-02-01 NOTE — ED Provider Notes (Signed)
[place]  EMERGENCY DEPARTMENT TREATMENT REPORT  NAME:  Jordan Weiss  SEX:   M  ADMIT: 02/01/2014  DOB:   1970/11/05  MR#    [id number]  ROOM:  2213  TIME DICTATED: 05 63 PM  ACCT#  [id number]        PRIMARY CARE PHYSICIAN:  Unknown.    CHIEF COMPLAINT:  Chest pain, dyspnea, nausea, vomiting.    HISTORY OF PRESENT ILLNESS:  A 43 year old male who comes in with a complaint of chest pain that woke him  up out of sleep.  He describes it as sharp, radiating to his back, down his  left arm.  It has been constant.  It is located in the center of his chest.  He does feel short of breath.  Around 4 a.m. he started experiencing abdominal  pain that he describes as epigastric.  He had a gastric bypass 4 years ago.  He had 2 episodes of vomiting, denies any hematemesis.  He has been having  normal bowel movements, denies any diarrhea.  The patient is on pain  management due to degenerative disk disease.  He usually takes Tylenol 3.  He  denies any other alleviating or aggravating factors associated with his  condition.    REVIEW OF SYSTEMS:  CONSTITUTIONAL:  No fever, chills or weight loss.  EYES:   No visual symptoms.  ENT:  No sore throat, runny nose or other URI symptoms.  RESPIRATORY:  Admits to shortness of breath, denies cough.  CARDIOVASCULAR:  Chest pain.  GASTROINTESTINAL:  Admits to no vomiting.  Abdominal pain.  MUSCULOSKELETAL:  No joint pain or swelling.  INTEGUMENTARY:  No rashes.  NEUROLOGICAL:  No headaches, sensory or motor symptoms.    PAST MEDICAL HISTORY:  Myocardial infarction, degenerative disk disease, hypertension, gastric  bypass, skin grafts.    SOCIAL HISTORY:  Admits to tobacco use.  Denies alcohol or drug use.    FAMILY HISTORY:  Noncontributory.    CURRENT MEDICATIONS:  Listed and reviewed in [place].    ALLERGIES:  HEPARIN.    PHYSICAL EXAMINATION:  VITAL SIGNS:  Blood pressure 169/90, pulse 63, respirations 15, temperature is   98, pain is 10 out of 10, O2 saturations 100% on room air.  GENERAL APPEARANCE:  Patient appears well developed and well nourished.  Appearance and behavior are age and situation appropriate.    HEENT:  Eyes:  Conjunctivae clear, lids normal.  Pupils equal, symmetrical,  and normally reactive.  Mouth/Throat:  Surfaces of the pharynx, palate, and  tongue are pink, moist, and without lesions.  NECK:  Supple, nontender, symmetrical, no masses or JVD, trachea midline,  thyroid not enlarged, nodular or tender.    LYMPHATIC:  No cervical or submandibular lymphadenopathy palpated.  RESPIRATORY:  Clear and equal breath sounds.  No respiratory distress,  tachypnea, or accessory muscle use.    CARDIOVASCULAR:   Heart regular, without murmurs, gallops, rubs, or thrills.    DP pulses 2+ and equal bilaterally.  No peripheral edema or significant  varicosities.  Vascular:  Calves are soft and nontender.  GASTROINTESTINAL:  The abdomen is soft, tender to palpation in the  epigastrium.  No abdominal or inguinal masses appreciated by inspection or  palpation.  MUSCULOSKELETAL:  The patient is moving all extremities well.  Spine:  There  is no localized cervical, thoracic, lumbar or sacral body tenderness to  palpation or fist percussion.  There are no bony step-offs, ecchymosis, areas  of soft tissue swelling or  deformities.  There is no paravertebral tenderness  to palpation.  SKIN:  Warm and dry without rashes.    CONTINUATION BY Flemon Kelty HIMMEL-Tobey Schmelzle, DO:     The patient was seen and treated on 02/01/14; initially evaluated by Tanna Savoy, PA-C.      INITIAL ASSESSMENT AND MANAGEMENT PLAN:    The patient is a rather histrionic gentleman who presents to the Emergency  Department complaining of chest pain that goes through to his back and also  abdominal pain.  When I evaluated the patient, I found him in the fetal  position, complaining of severe chest and abdominal pain.  The patient stated   that the nitroglycerin that he was given did not relieve any of this pain.  The patient will be switched to something narcotic.  The patient will be  evaluated via chest pain protocol.  Of concern, however, is possible  dissection and he will get CT once his creatinine is back.    DIAGNOSTIC STUDIES:  The patient's 12-lead EKG shows a rate of 56 beats a minute and some  nonspecific T-wave changes but there is no evidence of ischemia or infarction  on this EKG.  The patient's LFTs and lipase are normal.  The patient's 2-view  chest x-ray is negative.  The patient's troponin is negative.  The patient's  basic metabolic panel is normal as is his CBC, although it does show a minimal  left shift.  CT scan of the chest, abdomen and pelvis shows no aortic  dissection or aneurysm.  It shows cirrhosis and small amounts of upper  abdominal ascites, mesenteric fat stranding related to third spacing and a 3  cm right upper pole renal cyst.    COURSE IN THE EMERGENCY DEPARTMENT:  The patient was medicated with nitroglycerin 1 inch topically, which did not  relieve his pain.  He was given Zofran 4 mg and 20 mg of IM Bentyl as well as  20 mg of Pepcid, which also did not relieve his epigastric abdominal pain.  The patient was given Dilaudid 1 mg IV times 3 doses and on re-evaluation, his  pain was about a 7 but the patient is resting much more calmly then he was on  my initial evaluation.  The patient's care was discussed with Dr. Dawson Bills  from the hospitalists group.  He agreed to take him on their service for  further evaluation for his initial presenting complaint of chest pain.      CLINICAL IMPRESSION AND DIAGNOSIS:  Chest pain, precordial.    DISPOSITION AND PLAN:  Observation telemetry bed, currently stable.      ___________________  Marissa Calamity Himmel-Jeydi Klingel DO  Dictated By: Annett Fabian Rice, PA-C    My signature above authenticates this document and my orders, the final   diagnosis (es), discharge prescription (s), and instructions in the [facility] record.  Nursing notes have been reviewed by the physician/mid-level provider.    If you have any questions please contact [phone].    MLT  D:02/01/2014 17:46:15  T: 02/01/2014 18:26:56  [id number]  Electronically Authenticated by:  Odessa Fleming, DO On 02/08/2014 03:23 PM EDT

## 2014-02-01 NOTE — H&P (Signed)
[place]  History and Physical  NAME:  Jordan Weiss, Jordan Weiss  SEX:   M  ADMIT: 02/01/2014  DOB:[birth year]  MR#    [id number]  ROOM:  2213  ACCT#  [id number]    I hereby certify this patient for admission based upon medical necessity as  noted below:    <    CHIEF COMPLAINT:  Chest pain.    HISTORY OF PRESENT ILLNESS:  This is a 43 year old gentleman with past medical history significant for  hypertension, coronary artery disease of schizophrenia who presents with a 1  day duration of chest pain.  States the pain started last night. It is  epigastric and also center chest, described as sharp, nonradiating, on and off  lasting up to 1 hour. He has had some nausea during this time as well.  No  diaphoresis.  He states that it did feel like the chest pain he has had 4 or 5  years ago when he was diagnosed with an MI and subsequently had a stent  placed, the pain is rated 7 out of 10. Did not respond to nitroglycerin not  related to exertion.  In the emergency room, CT of the chest, abdomen and  pelvis with IV contrast was performed without any significant findings.  His  EKG shows normal sinus rhythm without any ischemic changes.  Cardiac enzymes  normal range.  Otherwise, CBC, CMP normal range.  The patient admitted for  further management of chest pain.    REVIEW OF SYSTEMS:  A 12-point review of systems is negative except as in HPI.    PAST MEDICAL HISTORY:  Hypertension, coronary artery disease, history of schizophrenia. Also  degenerative joint disease, history of diabetes mellitus, suicidal ideation.    PSYCHIATRIC HISTORY:    Schizoaffective bipolar, in previous notes also reported schizophrenia.    SOCIAL HISTORY:  The patient currently smokes half a pack per day.  Drinks roughly twice a  month, no illicit drug use.    FAMILY HISTORY:  Reviewed and noncontributory to the case.    ALLERGIES:  HEPARIN COMBINATION RESULTS IN ANAPHYLAXIS.    CURRENT MEDICATIONS:   Atenolol, diltiazem, hydrochlorothiazide, enalapril, Flexeril.    PHYSICAL EXAMINATION:  VITAL SIGNS:  Blood pressure 169/90, pulse 63, respiratory rate 15,  temperature is 98, pain sale is 10 out of 10, oxygen saturation is 100% on  room air.  GENERAL APPEARANCE:  No acute distress, resting comfortably, appears stated   age.  HEENT:  Normocephalic, atraumatic.  Extraocular muscles intact.  Pupils are  equal round and reactive to light and accommodation.  NECK:  Supple, no JVD, no lymphadenopathy. No thyromegaly.  LUNGS:  Clear to auscultation bilaterally, good air entry, no wheezing.  CARDIOVASCULAR:  S1, S2 within normal limits.  No murmurs, rubs or gallops.  ABDOMEN:  Positive bowel sounds, soft, mildly tender to moderate palpation  over the epigastric area.  No guarding, no rebound tenderness.  EXTREMITIES:  No clubbing, cyanosis or edema.  Dorsalis pedis pulses 2+  bilaterally.  SKIN:  No rash, no wounds.  NEUROLOGIC:  Cranial nerves II-XII grossly intact, nonfocal.  Alert and  oriented times 3.    LABORATORY DATA:  CBC, CMP normal range.  Lipase normal range.  Cardiac enzymes normal range.    EKG shows sinus bradycardia. No ST segment elevation. No T-wave inversion,  nonspecific T-wave changes which are new compared to an EKG performed on  08/12/2013.    IMAGING STUDIES:  CT of the abdomen and  pelvis with and without contrast shows no aortic  dissection or aneurysm, cirrhosis and small amount of upper abdominal ascites,  mesenteric fat stranding related to third spacing, a 3 cm right upper pole  renal cyst, 1.4 cm hypodensity medial aspect of the midpole of left kidney,  ultrasound suggested for further characterization.  Also, CT of the chest  shows no pulmonary embolism.  Otherwise, normal study. Chest x-ray:  No focal  infiltrates. No pneumothorax.    ASSESSMENT AND PLAN:  1.  Chest pain, continue morphine, rate controlled, trend cardiac enzymes,   continue aspirin.  The patient has ANAPHYLACTIC REACTION TO HEPARIN, continue  statin.  2.  Hypertension.  This is somewhat uncontrolled.  Resume home medications add  p.r.n. as needed.  3.  Coronary artery disease, history of status post stent placements 5 years  ago per patient report.  4.  History of schizophrenia, stable.  Home medications.  5.  Diet is cardiac.  6.  Deep venous thrombosis prophylaxis with sequential compression devices.    DISPOSITION:  Home in 1 to 2 days depending on response to treatment.    CODE STATUS:  FULL CODE.    Case discussed with ER attending and the patient.      ___________________  Rosalene Billings MD  Dictated By: .   [facility]  D:02/01/2014 16:10:96  T: 02/01/2014 04:54:09  [id number]  Electronically Authenticated and Edited by:  Rosalene Billings, MD On 02/03/2014 09:54 PM EDT

## 2014-02-01 NOTE — Progress Notes (Signed)
This encounter was created in error - please disregard.

## 2014-02-03 NOTE — Discharge Summary (Signed)
[place]  Discharge Summary   NAME:  Jordan Weiss  SEX:   M  ADMIT: 02/01/2014  [place]: 02/03/2014  DOB:   02-Feb-1971  MR#    [id number]  ACCT#  [id number]    cc: . Marland Kitchen     DATE OF ADMISSION:  02/01/2014    DATE OF DISCHARGE:  02/03/2014    PRIMARY CARE PHYSICIAN:  Dr. Pablo Ledger    DISCHARGE DIAGNOSES:  1.  Atypical chest pain.  2.  Abdominal pain with acute left renal mass suspicious for malignancy.  3.  Hypertension.  4.  Schizophrenia.    PROCEDURES PERFORMED DURING HOSPITALIZATION:  CT of the abdomen, CTA of the abdomen and pelvis, CTA of the chest, renal  ultrasound and nuclear medicine stress test.    HISTORY OF PRESENT ILLNESS:  This is a 43 year old white male with history of coronary artery disease as  well as hypertension who presents to the Emergency Department with complaint  of chest pain times 1 day.  He described the pain as in his epigastric area,  nonradiating, associated with nausea, no diaphoresis.  The patient states he  has had chest pain approximately 4 to 5 years prior and that this is very  similar discomfort.      Upon arrival to the ED, blood pressure was 169/90, pulse 63, respirations 15,  he is afebrile and O2 saturation is 100% on room air.  While in the ED, the  patient was given sublingual nitroglycerin which was to no avail.  First set  of cardiac enzymes was unremarkable and EKG showed sinus bradycardia with no  ST or T wave abnormality.  A chest x-ray was performed showing no acute  cardiopulmonary process.  This was then followed by CTA of the chest which  showed no aortic dissection or aneurysm.  He had evidence of cirrhosis with  small amount of upper abdominal ascites.  There was mesenteric fat stranding  related to third-spacing.  There was a 3 cm right upper pole renal cyst and a  1.5 cm hypodensity medial aspect of the midpole left kidney, suggesting an  ultrasound for further characterization.       The patient was admitted for further evaluation and treatment.  Additional  information can be gathered from the history and physical of Dr. Dawson Bills.    HOSPITAL COURSE:  Acute coronary syndrome was ruled out with serial negative cardiac enzymes.  The patient underwent nuclear medication stress test which showed no evidence  of ischemia and essentially a normal study.  The patient, however, continued  to complain of abdominal pain, more so than describing it as chest pain, and  which he stated was in the epigastric region.  A renal ultrasound showed a 2.9  cm cyst of the upper pole of the right kidney, a 1.5 septated cyst of the  upper pole of the right kidney and a 1.4 cm lesion midpole left kidney.  Based on the CT scan the noncontrast HU24 and contrast HU87 was suggestive of  enhancement, findings were concerning for renal neoplasm.  Urology was  consulted to assist with management and they have recommended outpatient  followup.  The patient will follow up for an appointment in approximately 2 to  3 weeks, their office will arrange.      At this time, the patient is eager for discharge.  He is still complaining of  abdominal pain but reports he has a history of Crohn disease.  There was no  prior mention  of Crohn disease in his chart but as he is eager for discharge I  will order a lipase and see if there is any evidence of possible pancreatitis.  Prior CT scanning showed no abnormality of the pancreas; there could be an  underlying malingering but for the benefit of doubt, will provide patient with  pain medication for discharge.  At time of discharge, blood pressure is  136/78, pulse 60, respirations 18, he is afebrile and O2 saturation is 100% on  room air.    LABORATORY DATA:  CBC:  WBC 7.2, hemoglobin and hematocrit 12 and 37, platelets 145.  BMP:  Sodium 141, potassium 3.9, chloride 106, bicarbonate 26, BUN 12, creatinine  0.7, glucose 115, calcium is 8.5.  Liver function studies within normal limits   and cardiac enzymes unremarkable.    DISCHARGE MEDICATIONS:  The patient will be discharged on the following medication:  Tylenol #3 two  tablets p.o. q.4 hours as needed for pain, enalapril 20 mg daily, diltiazem  120 mg p.o. daily, hydrochlorothiazide 25 mg daily, Colace 100 mg p.o. b.i.d.        Thank you for this opportunity to assist in the patient's management.  The  patient will follow up with primary care physician in 1 to 2 weeks and follow  up with urology per their recommendations.      ___________________  Mikki Santee MD  Dictated By: .     D:02/03/2014 15:36:51  T: 02/03/2014 16:40:56  [id number]  Electronically Authenticated by:  Mikki Santee, M.D. On 02/20/2014 08:20 AM EDT

## 2014-02-05 NOTE — Op Note (Signed)
[name]  Inpatient Operation Report  NAME:  Jordan Weiss  SEX:   M  DATE: 02/05/2014  DOB: [birth year]  MR#    [id number]  ROOM:  0454  ACCT#  [id number]        PREOPERATIVE DIAGNOSIS:  Internal hernia in patient with history of gastric bypass.    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    PROCEDURE:    Diagnostic laparoscopy, exploratory laparotomy, reduction and repair of  internal hernia.    SURGEON:  Jordan Sender, MD    ASSISTANT:  Surgical assistant Jordan Weiss    ANESTHESIA:  General endotracheal.    ESTIMATED BLOOD LOSS:  Minimal (less than 50 mL).    FLUIDS:  1500 mL    URINE OUTPUT:  110 mL     SPECIMEN:  None.    SURGICAL START TIME:  0229.    SURGICAL STOP TIME:  0327.    INDICATIONS:  The patient is a 43 year old white male with a history of a laparoscopic  gastric bypass done elsewhere in 2008 who has had intermittent, crampy  abdominal pain for the past year with worsening of pain over the past 4 to 5  days in particular over the past 2 days and with history, physical exam and CT  scan suggestive of an internal hernia with volvulus, with a CT scan showing  swirling of the superior mesenteric artery and probable internal hernias.  He  was admitted tonight and prepared emergently for surgery.    FINDINGS:  The patient had an internal mesenteric hernia with a defect at the  jejunojejunostomy and incarcerated herniation of almost all of the small bowel  involving all of the small bowel except for the terminal 20 cm of jejunum and  causing ischemia which was relieved by reduction of the hernia.  The  mesenteric defect was closed.  His gastric bypass anatomy consisted of an 80  cm antecolic, antegastric Roux limb, a 40 cm biliopancreatic limb.  We started  with diagnostic laparoscopy but it was not possible to define his anatomy  adequately with a laparoscopy, so we converted to exploratory laparotomy with   the above findings and with reduction of the hernia and reversal of small  bowel ischemia.  Also, had areas of edematous small bowel with exudate and  congestion.    DESCRIPTION OF PROCEDURE:  The patient was taken to the operating room and placed in the supine position  and after the induction of general endotracheal anesthesia, was prepped and  draped in the usual sterile fashion.  He did receive preoperative Lovenox and  SCDs for DVT prophylaxis and preoperative antibiotics.  We started  laparoscopically with a 12 mm Optiview trocar with 10 mm 0-degree laparoscope  placed in the supraumbilical incision and placed under visualization.  Upon  entering the abdominal cavity, a CO2 pneumoperitoneum was induced and  maintained at an insufflation pressure of 16 mmHg, and then the 0-degree  laparoscope was exchanged for a 10 mm 30-degree scope which was used for the  remainder of the laparoscopic portion of the procedure.  Under visualization,  an additional 5 mm trocar was placed in the lateral left upper quadrant, a 10  mm trocar was placed in the left upper quadrant and then a 10 mm trocar was  placed in the right upper quadrant.  Laparoscopic visualization demonstrated  dusky, somewhat ischemic small bowel throughout with vascular congestion and  areas of exudate.  We could define some upper  anatomy demonstrating an  antecolic Roux limb but as we followed the Roux limb distally, the anatomy  became obscured and he appeared to have an internal hernia but laparoscopic  was not possible to fully define this hernia or to attempt to reduce the  hernia; so, I elected to convert to open surgery.  The laparoscopic  instruments were removed and an upper midline incision was made, carried down  sharply through skin and subcutaneous tissue, midline fascia and peritoneum  and then the incision carried superiorly and inferiorly to the extent of the  wound.  Upon entering the abdominal cavity, the laparoscopic findings were   confirmed demonstrating dusky, ischemic small bowel.  We identified the  ileocecal valve and worked backward from the ileocecal valve along the ileum  and only the distal 20 cm of terminal ileum was not herniated.  Above this,  all of the jejunum and terminal ileum was herniated through a mesenteric  defect.  This was reduced from distal to proximal, reducing it all to the  right side and as we finally reduced all of the small bowel, it demonstrated  the site of the herniation.  It was through a mesenteric defect at the  jejunojejunostomy.  As we reduced all the small bowel, it returned normal  viability.  There was no point of obstruction.  Some of the bowel was  edematous and congested with some areas of early exudate formation, but after  it was reduced it all turn to normal viability.  His gastric bypass anatomy  was defined demonstrating an 80 cm antecolic, antegastric Roux limb and a 40  cm biliopancreatic limb with the defect at the jejunojejunostomy.  This  mesenteric defect was closed using continuous 2-0 silk with all of the  herniated small bowel having been reduced down to the right side.  The bowel  was thoroughly visualized again after reduction and again returned to normal  viability with no areas of injury or obstruction.  The bowel was returned to  its normal anatomic position.  The omentum of the transverse colon was placed  over the small bowel.  All instruments and retractors were removed.  Midline  fascia was closed using continuous #2 looped PDS.  The subcutaneous tissues  were irrigated and noted to be hemostatic and then reapproximated using  interrupted 2-0 Vicryl, and then the skin was closed with skin staples,  including the midline incision as well the laparoscopic port incisions.  Sterile dressings were applied.  Sponge and needle counts were correct.  The  patient tolerated the procedure well without complication, and after reversal   of anesthesia was taken to the recovery room in good condition.      ___________________  Jordan Pugh MD  Dictated By:.   SB  D:02/05/2014 03:53:18  T: 02/05/2014 10:41:05  [id number]  Electronically Authenticated by:  Jordan Pugh, MD On 02/06/2014 02:04 PM EDT

## 2014-02-05 NOTE — ED Provider Notes (Signed)
[facility]  EMERGENCY DEPARTMENT TREATMENT REPORT  NAME:  Jordan Weiss  SEX:   M  ADMIT: 02/04/2014  DOB:   December 09, 1970  MR#    [id number]  ROOM:  2952  TIME DICTATED: 08 53 PM  ACCT#  [id number]        I hereby certify this patient for admission based upon medical necessity as  noted below:     DATE AND TIME OF EVALUATION:  Saturday 02/04/2014.    CHIEF COMPLAINT:  Chest pain, abdominal pain.    HISTORY OF PRESENT ILLNESS:  This 43 year old male returns to the ED with a 10 out of 10 retrosternal sharp  chest pain that started today and diffuse 9 out of 10 abdominal pain that he  says has been present since he was released from this hospital yesterday.  He  was admitted 02/01/2014 for chest pain and abdominal pain.  He has a history  of  Crohn's and gastric bypass surgery.  When he was here on the 19th he was  evaluated with a CT of the abdomen and pelvis to rule out acute abnormality  within the abdomen.  The only thing that was seen was left renal mass.  The  patient did have a normal CTA of the chest.  He was admitted to the hospital  for this abdominal pain and for workup for chest pain.  He had a negative  nuclear stress test.  He was found to not have an elevated lipase while in the  hospital.  He was discharged just yesterday on Tylenol, enalapril, diltiazem,  hydrochlorothiazide and Colace.      The patient does not have a primary care physician.  He says he has mild  shortness of breath, no cough, no fevers, no vomiting.  He does have a little  bit of nausea and no diarrhea.  Last bowel movement was 2 days ago and was  brown and normal for him.    REVIEW OF SYSTEMS:  CONSTITUTIONAL:  No fever, chills, or weight loss.   EYES:   No visual symptoms.   ENT:  No sore throat, runny nose, or other URI symptoms.   HEMATOLOGIC/LYMPHATIC:   No excessive bruising or lymph node swelling.   RESPIRATORY:  Shortness of breath, no cough or wheezing.  CARDIOVASCULAR:  Chest pain.   GASTROINTESTINAL:  See HPI.    GENITOURINARY:  No dysuria, frequency, or urgency.   MUSCULOSKELETAL:  No joint pain or swelling.   INTEGUMENTARY:  No rashes.   NEUROLOGICAL:  No headaches, sensory or motor symptoms.     PAST MEDICAL HISTORY:  Coronary artery disease with MI, degenerative disk disease, hypertension,  Crohn's.    SURGICAL HISTORY:  A degloving injury left hand with skin graft, gastric bypass.    SOCIAL HISTORY:  The patient smokes half pack of cigarettes daily.  Denies alcohol or drug   abuse.    MEDICATIONS:  Reviewed in [facility].    ALLERGIES:   Reviewed in [facility].      PHYSICAL EXAMINATION:  GENERAL APPEARANCE:  Obese male lying on exam table in no acute distress.  VITAL SIGNS:  Blood pressure 162/87, pulse 61, respirations 15, temperature  97.9, O2 saturation 100% on room air.  EYES:  Conjunctivae clear, lids normal.  Pupils equal, symmetrical, and  normally reactive.   ENT: Shows buccal mucosa is dry without lesion.   RESPIRATORY:  Clear and equal breath sounds.  No respiratory distress,  tachypnea, or accessory muscle use.  CARDIOVASCULAR:   Heart regular, without murmurs, gallops, rubs, or thrills.   GI:  Abdomen is soft, nontender to palpation.  SKIN:  Warm and dry without rashes.   Nails:  No clubbing or deformities.  Nailbeds pink with prompt capillary  refill. No peripheral edema noted bilaterally.  NEUROLOGIC:  Alert, oriented.  Sensation intact, motor strength equal and  symmetric.     CONTINUATION BY Jacobus Colvin TSUCHITANI, MD:    RESULTS:  EKG showing a sinus rhythm, no ST elevations.  CT of the abdomen and pelvis  read by Dr. Posey Pronto: Post operative changes of antecolic Roux-en-Year-old  gastric bypass surgery.  Probable internal hernia involving the proximal and  mid jejunum.  No evidence of bowel ischemia at this time.  Prominent jejunal  mesenteric adenopathy, vascular congestion involving the mesentery. Small  right lower lobe pulmonary nodule.  Troponin is negative. CMP: Bilirubin 1.3,   otherwise unremarkable.  Lipase is normal.  X-ray of the chest: No abnormality  read by Dr. Rosita Fire.  CBC unremarkable.      EMERGENCY ROOM COURSE:   The patient was just admitted and worked up for chest pain and abdominal pain.  He had a nuclear stress test and a CT of his chest actually, and his chest  pain for me is very reproducible in his chest wall.  I sent a screening  troponin because he said his chest had been hurting all day, it is not  pleuritic.  The troponin is negative and his EKG is unremarkable.  The chest  pain does not sound cardiac in nature.  The patient had also been worked up  for abdominal pain while in the hospital.  He had had a CT about 3 days ago  that did not show any type of bowel obstruction.  He said that today he was  very nauseated, but he did not vomit, and he was complaining of increasing  pain since he went home yesterday.  I discussed with him initially whether or  not we needed to do another CAT scan and I told him he just one 3 days ago,   but  in speaking to him, he felt like his pain was worse than when he was in the  hospital, so we decided to repeat the CT scan.  I talked about those results  with him.  At this point, he is feeling better after having been medicated and  hydrated, and at this point, we are pending call from Dr Laurance Flatten for   disposition.    CONTINUATION BY Annitta Needs, MD:     I have spoken to Dr. Laurance Flatten, who was down in the department. He will see the  patient for disposition.  At change of shift, I have asked Dr. Sandi Mealy to follow  up the recommendations by Dr. Laurance Flatten.  Disposition will be per him with regards  to his abdominal pain.  With regards to the chest pain, he has an unremarkable  EKG, negative troponin, I can reproduce it when I palpate his chest.  He is  not having pleurisy. He recently had a nuclear stress test, so I was not  planning to do any further type of workup for the chest pain.    CONTINUATION BY EMILY Jacklynn Lewis, MD:     COURSE IN ED:   Patient was signed out to me at change of shift by Dr. Azzie Almas.  He was  pending evaluation by Dr. Laurance Flatten.  He was seen and evaluated by Dr. Laurance Flatten.  He  was taken to the operating room.    DIAGNOSES:  1.  Internal hernia involving the proximal and mid jejunum.  2.  Abdominal pain.  3.  Chest pain.    Patient is admitted in stable condition.      ___________________  Jeanine Luz Tsuchitani MD  Dictated By: Johnella Moloney, PA-C    My signature above authenticates this document and my orders, the final  diagnosis (es), discharge prescription (s), and instructions in the [facility] record.  Nursing notes have been reviewed by the physician/mid-level provider.    If you have any questions please contact [phone].    FS  D:02/04/2014 20:53:47  T: 02/05/2014 14:07:02  [id number]  Electronically Authenticated and Alison Stalling by:  Glade Nurse, MD On 02/06/2014 12:28 PM EDT

## 2014-02-05 NOTE — H&P (Signed)
[facility]  History and Physical  NAME:  Jordan Weiss, Jordan Weiss  SEX:   M  ADMIT: 02/04/2014  DOB:January 13, 1971  MR#    [id number]  ROOM:  5209  ACCT#  [id number]    I hereby certify this patient for admission based upon medical necessity as  noted below:    <    CHIEF COMPLAINT:  Abdominal pain.    HISTORY OF PRESENT ILLNESS:  The patient is a 43 year old white male with a history of a laparoscopic  gastric bypass done in [place] in 2008, who presents with worsening  abdominal pain for 48 hours.  He has had chronic intermittent abdominal pain  for at least the past year  described as crampy mid epigastric pain associated  with nausea, but no vomiting.  His bowels have been slightly irregular, but he  has not had any other obstructive symptoms.  He was admitted from 02/01/2014  through 02/03/2014 for atypical chest and abdominal pain with his cardiac  workup then being negative with a normal nuclear stress test and with a  CTA  done on 02/01/2014 showing a 1.5 cm renal mass as well as a small amount of  ascites, possible cirrhosis and edema of his jejunal limb.  Surgery was not  consulted at that time. He went home and again presented to the emergency room  tonight with persistent epigastric pain as described. Repeat CT scan done  tonight is described below, but shows changes compared to previous study  consistent with probable internal hernia and possible volvulus.  His gastric  bypass was done in [place] in 2008 with a preoperative weight of 430  pounds and a current weight of 250 pounds, which has been stable.  He has not  been taking his vitamin supplements.    ALLERGIES:  HEPARIN, which causes throat swelling, but he has had no problems with Lovenox  in the past.    CURRENT MEDICATIONS:  Flomax 0.4 mg every day, atenolol 25 mg every day, dicyclomine p.r.n.,  enalapril 25 mg every day, hydrochlorothiazide 25 mg every day, diltiazem 120   mg every day, Tylenol No. 3 two p.o. four times a day for joint pain, Flexeril  one p.o. four times a day.    PAST MEDICAL HISTORY:  Significant for hypertension beginning at the age of 70.   Obesity with a  pre-gastric bypass weight of 430 pounds and a current weight of 250 pounds,  which has been stable.   Degenerative joint disease involving back and hips.  A possible history of Crohn's disease based on chronic intermittent abdominal  pain for the past year and on an endoscopy which was done approximately 1 year  ago, reflux before his gastric bypass which has resolved.  Prostatic  hypertrophy.   He has had atypical chest pain with a negative workup. There is  no history of pulmonary disease or sleep apnea. He does have a newly-diagnosed  left renal mass based on CTA done on 02/01/2014. Workup for this is in  progress.  There is no known history of hepatic disease.    PAST SURGICAL HISTORY:  Laparoscopic gastric bypass done in [place] in 2008, shoulder surgery  on one side in 2014 and on the other side in 2013.  A degloving injury of his  left hand in 1988 treated with transfer graft from his right chest.    FAMILY AND SOCIAL HISTORY:  He smokes 1 pack a day, drinks alcohol rarely.    FAMILY  HISTORY:  Significant for mother who is deceased with a history of breast cancer,  diabetes, coronary artery disease, hypertension, elevated cholesterol and  congestive heart failure.  Father who is deceased with history of lung cancer,  stroke, hypertension and elevated cholesterol.    REVIEW OF SYSTEMS:  He is fairly active with unlimited walk exercise tolerance and no chest pain,  palpitations, PND or orthopnea.  Did have sort of atypical chest wall pain 2  days ago with workup negative as described above.  No cough, shortness of  breath or wheezing.  No sleep apnea symptoms.  GI symptoms are as noted in  present illness with chronic intermittent abdominal pain for 1 year with   worsening over the past 2 days.  He has a history of a colonoscopy  approximately 5 years ago and an upper endoscopy approximately one year ago.  Urinary symptoms consist of frequency and nocturia once or twice a night.  Extremity and musculoskeletal symptoms consist of chronic low back pain and  hip pain.  There is no history of anemia, blood transfusions or bleeding  disorders.  He has no symptoms of neuropathy.    PHYSICAL EXAMINATION:  GENERAL:  Reveals a mildly obese, but otherwise well-appearing white male.  Does not appear to be in any acute distress, but does describe some mid  epigastric pain.  VITAL SIGNS:  Blood pressure is 123/75, pulse of 60, respirations 18.  He is  afebrile.  HEENT:  Pupils are equal and reactive.  Sclerae nonicteric.  Ears and nose and  throat are clear.  NECK:  Supple, without adenopathy.  Carotids are 2+/4 without bruits.  He has  no jugulovenous distension.  Airways clinically normal.  CHEST:  Clear to percussion and auscultation.  He does have a scar over his  right lower anterior chest from his hand degloving treatment.  ABDOMEN:  Shows umbilical and upper abdominal laparoscopy scars.  He has mild,  poorly localized epigastric tenderness without guarding.  There is no palpable  mass.  GENITOURINARY:  Shows normal descended testes.  He has no hernia.  EXTREMITIES:  Warm and well perfused.  NEUROLOGIC:  Shows intact cranial nerves II-XII.  Motor exam is 5+/5  throughout.  Reflexes are symmetrical.  Cerebellar exam is normal.  He has no  nystagmus.    ADMISSION TESTING:  White count is 5.4, hemoglobin and hematocrit of 12.9/37.5 with a platelet  count of 185,000.  Sodium is 141, potassium 3.8, chloride 106, CO2 25, glucose  76, BUN 19, creatinine 0.9, AST is 16, ALT is 19, total bilirubin is 1.3,  albumin is 4.4, lipase is 87.    PERTINENT LABS:  CT scan of the abdomen done today on 02/04/2014 and compared to previous CTA   done 96/78/9381 shows an antecolic Roux limb of gastric bypass anatomy with  edema and adenopathy at the proximal jejunum and swirling of the SMA  suggestive  of internal hernia with volvulus. The swirling of the SMA  is new  compared to the previous study on 02/01/2014.  He had a small amount of  ascites on his CAT scan on 02/01/2014 which also is somewhat suggestive of  cirrhosis, and he had a 1.5 cm left renal mass with Nephrology followup  arranged. Nuclear stress test done on 02/03/2014 was normal showing no  evidence of cardiac ischemia.    IMPRESSION:  1.  Internal hernia with volvulus.  2.  History of laparoscopic gastric bypass in 2008, done in [place].  3.  Hypertension.  4.  Obesity with a body mass index of 31, and a prior history of morbid   obesity.  5.  Degenerative joint disease.  6.  Left renal mass with workup in progress.  7.  Prostatic hypertrophy.  8.  Possible history of Crohn's disease based on chronic pain.  9.  Atypical chest pain with negative workup including a normal nuclear stress  test.    RECOMMENDATIONS:  With his chronic intermittent pain along with CT findings suggestive of  internal hernia and volvulus, I have recommended urgent surgery to consist of  laparoscopic, possible open, exploration for probable internal hernia with  other indicated procedures  based on surgical findings. The procedure itself  as well as the risks, indications and options have been discussed at length  with the patient including risks of anesthesia, infection, bleeding, scar,  pain, open surgery, negative exploration and other surgery depending on  findings at surgery. I feel that this surgery should be done on a relatively  urgent basis because of the new findings on CT scan in preparation for  surgery. He has not been compliant with his outpatient gastric bypass  supplements, so will give him empiric B1 and B12. He will  get IV antibiotics,   and DVT prophylaxis will be with SCDs and Lovenox. He appears to understand  and desires to proceed.      ___________________  [name] MD  Dictated By: .   Rudean Curt  D:02/05/2014 01:41:03  T: 02/05/2014 30:86:57  [id number]  Electronically Authenticated by:  [name], MD On 02/06/2014 02:04 PM EDT

## 2014-02-05 NOTE — Op Note (Signed)
[facility]  Inpatient Operation Report  NAME:  Monte Fantasia  SEX:   M  DATE: 02/05/2014  DOB: [birth year]  MR#    [id number]  ROOM:  7124  ACCT#  [id number]        PREOPERATIVE DIAGNOSIS:  Internal hernia in patient with history of gastric bypass.    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    PROCEDURE:    Diagnostic laparoscopy, exploratory laparotomy, reduction and repair of  internal hernia.    SURGEON:  Loma Sender, MD    ASSISTANT:  Surgical assistant Vertell Limber    ANESTHESIA:  General endotracheal.    ESTIMATED BLOOD LOSS:  Minimal (less than 50 mL).    FLUIDS:  1500 mL    URINE OUTPUT:  110 mL     SPECIMEN:  None.    SURGICAL START TIME:  0229.    SURGICAL STOP TIME:  0327.    INDICATIONS:  The patient is a 43 year old white male with a history of a laparoscopic  gastric bypass done elsewhere in 2008 who has had intermittent, crampy  abdominal pain for the past year with worsening of pain over the past 4 to 5  days in particular over the past 2 days and with history, physical exam and CT  scan suggestive of an internal hernia with volvulus, with a CT scan showing  swirling of the superior mesenteric artery and probable internal hernias.  He  was admitted tonight and prepared emergently for surgery.    FINDINGS:  The patient had an internal mesenteric hernia with a defect at the  jejunojejunostomy and incarcerated herniation of almost all of the small bowel  involving all of the small bowel except for the terminal 20 cm of jejunum and  causing ischemia which was relieved by reduction of the hernia.  The  mesenteric defect was closed.  His gastric bypass anatomy consisted of an 80  cm antecolic, antegastric Roux limb, a 40 cm biliopancreatic limb.  We started  with diagnostic laparoscopy but it was not possible to define his anatomy  adequately with a laparoscopy, so we converted to exploratory laparotomy with  the above findings and with reduction of the  hernia and reversal of small  bowel ischemia.  Also, had areas of edematous small bowel with exudate and  congestion.    DESCRIPTION OF PROCEDURE:  The patient was taken to the operating room and placed in the supine position  and after the induction of general endotracheal anesthesia, was prepped and  draped in the usual sterile fashion.  He did receive preoperative Lovenox and  SCDs for DVT prophylaxis and preoperative antibiotics.  We started  laparoscopically with a 12 mm Optiview trocar with 10 mm 0-degree laparoscope  placed in the supraumbilical incision and placed under visualization.  Upon  entering the abdominal cavity, a CO2 pneumoperitoneum was induced and  maintained at an insufflation pressure of 16 mmHg, and then the 0-degree  laparoscope was exchanged for a 10 mm 30-degree scope which was used for the  remainder of the laparoscopic portion of the procedure.  Under visualization,  an additional 5 mm trocar was placed in the lateral left upper quadrant, a 10  mm trocar was placed in the left upper quadrant and then a 10 mm trocar was  placed in the right upper quadrant.  Laparoscopic visualization demonstrated  dusky, somewhat ischemic small bowel throughout with vascular congestion and  areas of exudate.  We could define some upper  anatomy demonstrating an  antecolic Roux limb but as we followed the Roux limb distally, the anatomy  became obscured and he appeared to have an internal hernia but laparoscopic  was not possible to fully define this hernia or to attempt to reduce the  hernia; so, I elected to convert to open surgery.  The laparoscopic  instruments were removed and an upper midline incision was made, carried down  sharply through skin and subcutaneous tissue, midline fascia and peritoneum  and then the incision carried superiorly and inferiorly to the extent of the  wound.  Upon entering the abdominal cavity, the laparoscopic findings were  confirmed demonstrating dusky, ischemic small  bowel.  We identified the  ileocecal valve and worked backward from the ileocecal valve along the ileum  and only the distal 20 cm of terminal ileum was not herniated.  Above this,  all of the jejunum and terminal ileum was herniated through a mesenteric  defect.  This was reduced from distal to proximal, reducing it all to the  right side and as we finally reduced all of the small bowel, it demonstrated  the site of the herniation.  It was through a mesenteric defect at the  jejunojejunostomy.  As we reduced all the small bowel, it returned normal  viability.  There was no point of obstruction.  Some of the bowel was  edematous and congested with some areas of early exudate formation, but after  it was reduced it all turn to normal viability.  His gastric bypass anatomy  was defined demonstrating an 80 cm antecolic, antegastric Roux limb and a 40  cm biliopancreatic limb with the defect at the jejunojejunostomy.  This  mesenteric defect was closed using continuous 2-0 silk with all of the  herniated small bowel having been reduced down to the right side.  The bowel  was thoroughly visualized again after reduction and again returned to normal  viability with no areas of injury or obstruction.  The bowel was returned to  its normal anatomic position.  The omentum of the transverse colon was placed  over the small bowel.  All instruments and retractors were removed.  Midline  fascia was closed using continuous #2 looped PDS.  The subcutaneous tissues  were irrigated and noted to be hemostatic and then reapproximated using  interrupted 2-0 Vicryl, and then the skin was closed with skin staples,  including the midline incision as well the laparoscopic port incisions.  Sterile dressings were applied.  Sponge and needle counts were correct.  The  patient tolerated the procedure well without complication, and after reversal  of anesthesia was taken to the recovery room in good condition.      ___________________  [name] MD  Dictated By:.   SB  D:02/05/2014 03:53:18  T: 02/05/2014 10:41:05  [id number]  Electronically Authenticated by:  [name], MD On 02/06/2014 02:04 PM EDT

## 2014-02-06 ENCOUNTER — Ambulatory Visit: Payer: Self-pay | Admitting: Cardiovascular Disease

## 2014-02-06 NOTE — Discharge Summary (Signed)
[facility]  Discharge Summary   NAME:  Jordan Weiss  SEX:   M  ADMIT: 02/04/2014  [place]: 02/06/2014  DOB:   Dec 28, 1970  MR#    [id number]  ACCT#  [id number]    cc: . Marland Kitchen     ADMISSION DATE:   02/04/2014     DISCHARGE DATE:   02/06/2014     ADMISSION DIAGNOSES:  1.  Internal hernia with possible volvulus.    2.  History of laparoscopic gastric bypass in 2008 done in [place].    3.  Hypertension.  4.  Obesity with a body mass index of 31 and a prior history of morbid   obesity.  5.  Degenerative joint disease.  6.  Left renal mass with workup in progress.  7.  Prostate hypertrophy.  8.  Possible history of Crohn's disease based on chronic pain.  9.  Atypical chest pain with a negative workup during recent hospitalization  including a normal nuclear stress test.    DISCHARGE DIAGNOSES:  1.  Internal mesenteric hernia with bowel ischemia relieved by reduction and  repair.  2.  History of laparoscopic gastric bypass in 2008 done in [place].    3.  Hypertension.  4.  Obesity with a body mass index of 31 and a prior history of morbid   obesity.  5.  Degenerative joint disease.  6.  Left renal mass with workup in progress.  7.  Prostate hypertrophy.  8.  Possible history of Crohn's disease based on chronic pain.  9.  Atypical chest pain with a negative workup during recent hospitalization  including a normal nuclear stress test.    PROCEDURES DONE:    On 02/05/14:   Diagnostic laparoscopy, exploratory laparotomy, reduction and  repair of an internal hernia.    HISTORY OF PRESENT ILLNESS:  The patient is a 43 year old white male with a history of laparoscopic gastric  bypass done in [place] in 2008 who presents with worsening abdominal  pain for 48 hours and history of chronic intermittent abdominal pain for at  least the past year, described as crampy midepigastric pain associated with  nausea.  His bowels have been slightly irregular but he has not had any   obstructive symptoms.  He was admitted to the hospital from 08/19 through  08/21 with atypical chest and abdominal pain with cardiac workup being  negative including a negative nuclear stress test.  CTA during that  hospitalization showed some thickening of his Roux limb and a small amount of  ascites.  CT scan done on admission to the emergency room at this time shows a  suggestion of an internal hernia with a swirling of his intramesenteric   vessels.    ALLERGIES:  HEPARIN, WHICH CAUSES SWELLING, BUT HE HAS HAD NO PROBLEMS WITH LOVENOX IN THE  PAST.      ADMISSION MEDICATIONS:  Flomax 0.4 mg every day, atenolol 25 mg every day, dicyclomine p.r.n.,  enalapril 25 mg every day, hydrochlorothiazide 25 mg every day, diltiazem 120  mg every day, Tylenol #3 p.r.n., Flexeril p.r.n. 4 times a day.    MEDICAL HISTORY:  Significant for hypertension beginning at the age of 31 and obesity with  a  pre-gastric bypass weight of 430 and a current weight of 250, which has been  stable, degenerative joint disease involving back and hips, possible history  of Crohn's disease based on his chronic and intermittent abdominal pain and  endoscopy done a year ago, prostatic  hypertrophy, atypical chest pain with a  negative workup.  Does have a newly diagnosed left renal mass based on CTA  done on 08/19 with workup with Urology in progress.  There is no history of  liver disease.    PAST SURGICAL HISTORY:  Laparoscopic gastric bypass done in [place]  in 2008, shoulder surgery  in 2014 and 2013, a degloving injury of his left hand in 1988, treated with a  transfer graft from his right chest.    ADMISSION PHYSICAL EXAMINATION:  VITAL SIGNS:  Blood pressure is 123/75, pulse 60, respirations 18.  He is  afebrile.  HEAD AND NECK:  Normal.  CHEST:  Clear to percussion and auscultation.  HEART:  Regular rate and rhythm without murmur.  Has a scar over his right  chest from his degloving injury treatment.   ABDOMEN:  Shows umbilical and upper abdominal laparoscopy scars.  He has mild  poorly localized epigastric tenderness without guarding.  There is no palpable  mass.  EXTREMITIES:  Show the degloving injury on his left hand and is otherwise  normal.    ADMISSION TESTING:  White count is 5.4, hemoglobin and hematocrit of 12/37.  Electrolytes were  normal.  LFTs were normal.  CT scan of the abdomen and pelvis done on 08/22  and compared to a previous CTA done on 60/73 shows an antecolic Roux limb of  his gastric bypass anatomy with edema and adenopathy of the proximal jejunum  and swirling in the SMA suggestive of an internal hernia with possible   volvulus.    HOSPITAL COURSE:  He was admitted and prepared emergently for surgery.  On the night of  admission, which was the morning of 08/23, he was taken to the operating room  where under general endotracheal anesthesia he underwent a diagnostic  laparoscopy followed by exploratory laparotomy with the findings of a  mesenteric hernia at his jejunojejunal anastomosis with herniation of almost  all of his small bowel except for the terminal 20 cm of ileum.  The bowel was  mildly dusky and ischemic, but that resolved with reduction of the hernia and  was all viable.  Internal hernia was repaired.  Bowel was all reduced.  Gastric bypass anatomy was antecolic, antegastric 80 cm Roux limb and a 40 cm  biliary limb.  Operative and postoperative course were uncomplicated.  He  remained afebrile, normal vital signs.  Good urine output, good control of  pain, and good relief of his preoperative GI symptoms.  By the time of  discharge, he is tolerating a diet with normal bowel and bladder function.  Wounds are healing well without evidence of infection.  He is ambulating and  pain is controlled with oral medication.  We did give empiric B12 and thiamine  while he was in the hospital.  He had perioperative antibiotics consisting of   Zosyn.  CBC, electrolytes on the 1st postoperative day were normal with the  exception of magnesium, which was replaced.  He will be discharged today to  home.  He will follow up as an outpatient in 2 weeks.  Activities ad lib with  the exception of lifting, straining or heavy activity and no driving until he  is off pain medications.    DISCHARGE MEDICATIONS:  Flintstones chewable vitamins twice a day, calcium citrate 2 grams a day,  sublingual B12 500 mcg every day, Norco 1 or 2 p.o. q.4-6 h. p.r.n. pain and  also continue his Flomax 0.5 mg every  day, atenolol 25 mg every day, enalapril  25 mg every day, hydrochlorothiazide 25 mg every day, diltiazem 120 mg every  day.  He will not need to resume his dicyclomine or Flexeril.  Diet is soft,  early gastric bypass diet, which was discussed with him and will advance this  as an outpatient as tolerated.      ___________________  [name] MD  Dictated By: .   Tresa Sherlene Rickel  D:02/06/2014 13:53:10  T: 02/06/2014 17:54:37  [id number]  Electronically Authenticated by:  [name], MD On 02/08/2014 06:25 PM EDT

## 2014-02-08 ENCOUNTER — Other Ambulatory Visit: Payer: Self-pay | Admitting: Family Medicine

## 2014-03-14 ENCOUNTER — Encounter: Payer: Self-pay | Admitting: Cardiovascular Disease

## 2014-03-17 ENCOUNTER — Other Ambulatory Visit: Payer: Self-pay | Admitting: *Deleted

## 2014-03-17 MED ORDER — ENALAPRIL MALEATE 10 MG PO TABS: 10.0000 mg | ORAL_TABLET | Freq: Every day | ORAL | Status: AC

## 2014-05-19 LAB — CBC WITH AUTOMATED DIFF
BASOPHILS: 0.3 % (ref 0–3)
EOSINOPHILS: 6.3 % — ABNORMAL HIGH (ref 0–5)
HCT: 36.4 % — ABNORMAL LOW (ref 37.0–50.0)
HGB: 12.3 gm/dl — ABNORMAL LOW (ref 12.4–17.2)
IMMATURE GRANULOCYTES: 0.3 % (ref 0.0–3.0)
LYMPHOCYTES: 33 % (ref 28–48)
MCH: 29.4 pg (ref 23.0–34.6)
MCHC: 33.8 gm/dl (ref 30.0–36.0)
MCV: 87.1 fL (ref 80.0–98.0)
MONOCYTES: 7.2 % (ref 1–13)
MPV: 11 fL — ABNORMAL HIGH (ref 6.0–10.0)
NEUTROPHILS: 52.9 % (ref 34–64)
NRBC: 0 (ref 0–0)
PLATELET: 146 10*3/uL (ref 140–450)
RBC: 4.18 M/uL (ref 3.80–5.70)
RDW-SD: 41.8 (ref 35.1–43.9)
WBC: 5.7 10*3/uL (ref 4.0–11.0)

## 2014-05-19 LAB — POC URINE MACROSCOPIC
Blood: NEGATIVE
Glucose: NEGATIVE mg/dl
Ketone: NEGATIVE mg/dl
Leukocyte Esterase: NEGATIVE
Nitrites: NEGATIVE
Protein: NEGATIVE mg/dl
Specific gravity: >=1.03 (ref 1.005–1.030)
Urobilinogen: 0.2 EU/dl (ref 0.0–1.0)
pH (UA): 5.5 (ref 5–9)

## 2014-05-19 LAB — METABOLIC PANEL, COMPREHENSIVE
ALT (SGPT): 22 U/L (ref 12–78)
AST (SGOT): 15 U/L (ref 15–37)
Albumin: 3.7 gm/dl (ref 3.4–5.0)
Alk. phosphatase: 106 U/L (ref 45–117)
BUN: 13 mg/dl (ref 7–25)
Bilirubin, total: 0.3 mg/dl (ref 0.2–1.0)
CO2: 28 mEq/L (ref 21–32)
Calcium: 8.3 mg/dl — ABNORMAL LOW (ref 8.5–10.1)
Chloride: 108 mEq/L — ABNORMAL HIGH (ref 98–107)
Creatinine: 0.6 mg/dl (ref 0.6–1.3)
GFR est AA: >60
GFR est non-AA: >60
Glucose: 95 mg/dl (ref 74–106)
Potassium: 3.5 mEq/L (ref 3.5–5.1)
Protein, total: 6.8 gm/dl (ref 6.4–8.2)
Sodium: 141 mEq/L (ref 136–145)

## 2014-05-19 LAB — LIPASE: Lipase: 683 U/L — ABNORMAL HIGH (ref 73–393)

## 2014-05-19 NOTE — ED Provider Notes (Addendum)
[facility]  EMERGENCY DEPARTMENT TREATMENT REPORT  NAME:  Jordan Weiss  SEX:   M  ADMIT: 05/18/2014  DOB:   12-Mar-1971  MR#    [id number]  ROOM:    TIME DICTATED: 11 25 PM  ACCT#  [id number]    cc: Desma Maxim MD, Loma Sender MD    TIME OF EVALUATION:   2315    PRIMARY CARE PHYSICIAN:  Desma Maxim, MD    SURGEON:  Loma Sender, MD    CHIEF COMPLAINT:  Abdominal pain, back pain and diarrhea.     HISTORY OF PRESENT ILLNESS:  This is a 43 year old male who had a surgery to relieve an internal hernia  with Dr. Laurance Flatten 2 months ago. He says he has been experiencing cyclic, crampy  abdominal pain and diarrhea several times per day. Ever since that time, he  has had followup with Dr. Laurance Flatten. He has not been prescribed any medication for  the diarrhea as he apparently was told that was part of the healing process.  He had a CT scan of his abdomen and pelvis done about a month ago ordered by  Dr. Ardyth Harps which is unremarkable with the exception of something on his  kidneys that he is scheduled to see urology but is nothing related to the  abdominal pain. He has been taking Imodium for the diarrhea but states it has  not been helping. He has not been anything for the abdominal pain other than  Pepto-Bismol but that is not hurting. He describes it as a 6/10 crampy type of  sensation which he is constant day-to-day with several episodes of diarrhea  every day but no blood in the diarrhea. The patient also stated that he  tripped and kind of twisted his back coming down the stairs earlier today as  well and that is a throbby sensation 6/10. He drove himself here for  evaluation. Has not taken anything for the back pain. He otherwise denies any  fever, chills, cough, chest pain, shortness of breath. No other alleviating or  exacerbating factors.     REVIEW OF SYSTEMS:   CONSTITUTIONAL:  No fever, chills or weight loss.  RESPIRATORY: No cough, shortness of breath, or wheezing.    CARDIOVASCULAR:  No chest pain, chest pressure, or palpitations.  GASTROINTESTINAL:  Positive for abdominal pain, diarrhea.   MUSCULOSKELETAL:  Positive for low back pain.   NEUROLOGICAL:  No headaches, sensory or motor symptoms.    PAST MEDICAL HISTORY:   Hypertension, he has had a MI previously, DJD, abdominal surgery 2 months ago,  IBS, Crohn disease.    PAST SURGICAL HISTORY:   Degloving to the left hand when he was 54. Also gastric bypass.     PSYCHIATRIC HISTORY:   No psychiatric history.     FAMILY HISTORY:  Noncontributory to this case.     SOCIAL HISTORY:   The patient smokes cigarettes. Drinks socially. No illicit drug use.     ALLERGIES:   HE IS ALLERGIC TO HEPARIN.     MEDICATION:   He is on atenolol, diltiazem, enalapril, hydrochlorothiazide, and Flexeril.    PHYSICAL EXAMINATION:   VITAL SIGNS:  Blood pressure is 119/84, pulse 60, respirations 18, temperature  is 97.3, satting at 100% on room air.   CONSTITUTIONAL:  This is a well-developed, well-nourished appearing  43 year old male lying quite comfortably in the bed, cooperative with the  evaluation and very pleasant.   RESPIRATORY:  Clear and equal breath sounds.  No respiratory  distress,  tachypnea, or accessory muscle use.  CARDIOVASCULAR:  Heart regular without murmurs, gallops, rubs or thrills.  GASTROINTESTINAL:  Abdomen is soft. It is tender to palpation in the  epigastric region. Minimally tender in the remainder. He has well-healed scars  from his surgical site on the midline of his abdomen. There is no  organomegaly, masses, ecchymosis or hematoma. No rebound tenderness or  guarding. His abdomen is soft.   MUSCULOSKELETAL:  The patient has some minimal tenderness to palpation in the  lumbosacral area of the back. Has some pain along the midline. No crepitus,  step-off, hematoma, no sign of trauma.   NEUROLOGICAL:  The patient is alert and oriented times 3 and he is able to  move all of his extremities upper and lower bilaterally without  difficulty.     CONTINUATION BY JEFFREY PADGETT, PA-C:      IMPRESSION AND PLAN:  This is a 43 year old male who presents for evaluation of abdominal pain and  diarrhea that has been going on for 2 months now status post internal hernia  repair with Dr. Laurance Flatten.  The patient has a followup with Dr. Laurance Flatten on Monday as  well as Dr. Ardyth Harps on Monday.  He does have some very minimal diffuse  abdominal pain, not really epigastric pain nor is it right upper quadrant,  right lower quadrant abdominal pain.  we will get abdominal labs and a urine.  Differential to include gastritis versus cholecystitis or pancreatitis,  appendicitis.  Again, however, his pain is not really focal to his epigastric,  right upper quadrant or right lower quadrant and has been going on fairly  regularly for about 2 months now, so those are less likely in the  differential.  The patient has not been on any antibiotics.  We will rehydrate  him once with normal saline here, offer him some Bentyl but he would refuse  that.      DIAGNOSTIC INTERPRETATIONS:  Urinalysis unremarkable.  CMP unremarkable.  His lipase was elevated at 683.  CBC unremarkable.      EMERGENCY DEPARTMENT COURSE:  The patient was rehydrated with 1 liter normal saline.  He refused the Bentyl.  However, after the rehydration he fell comfortably asleep and was not  experiencing any more difficulty.  I discussed the results of lab work with  the patient and he did tell me that he was under-dosing himself with respect  to Imodium and the diarrhea, so I told him to step that back up to as directed  on the packaging when he got home and I would be more than happy to write him  for some Bentyl here if he wanted to try that.  Again, with respect to the  elevated lipase, this does not seem to be pancreatitis.  The patient is not  acutely tender in the epigastric region.  He has otherwise normal vital signs.  He felt  he was ready for discharge at that point and we agreed the patient is   safe, stable and ready for discharge.    DIAGNOSES:  1.  Generalized abdominal pain.  2.  Dehydration.  3.  Diarrhea, presumed infectious.      DISPOSITION:    The patient remained completely stable during his course in the Emergency  Department.  He was discharged home in stable condition.  I wrote him a  prescription for Bentyl and advised him to follow up as planned with his  surgeon and his regular care physician and also  GI when he was able, and  return to the Emergency Department if his condition worsens or any new  symptoms develop.  continue the Imodium OTC but use as directed and do not  under-dose.  The patient was agreeable with the plan.  The patient was seen  and evaluated by myself and Dr. Rigoberto Noel, who agrees with the above assessment and  plan.      ___________________  Truddie Crumble M.D.  Dictated By: Alphonzo Cruise, PA-C    My signature above authenticates this document and my orders, the final  diagnosis (es), discharge prescription (s), and instructions in the [facility] record.  Nursing notes have been reviewed by the physician/mid-level provider.    If you have any questions please contact [phone].    [facility]  D:05/18/2014 23:25:39  T: 05/19/2014 11:04:16  [id number]  Electronically Authenticated by:  Hortense Ramal, M.D. On 06/07/2014 06:12 AM EST

## 2014-09-08 ENCOUNTER — Encounter: Attending: Urology | Primary: Physician Assistant

## 2014-09-11 NOTE — Telephone Encounter (Signed)
Returned pt call, no answer. Left vm for him to call the office back

## 2014-09-11 NOTE — Telephone Encounter (Signed)
PT called regarding his results

## 2014-09-20 ENCOUNTER — Encounter: Attending: Urology | Primary: Physician Assistant

## 2014-09-20 ENCOUNTER — Ambulatory Visit: Admit: 2014-09-20 | Discharge: 2014-09-20 | Payer: MEDICARE | Attending: Urology | Primary: Physician Assistant

## 2014-09-20 DIAGNOSIS — N289 Disorder of kidney and ureter, unspecified: Secondary | ICD-10-CM

## 2014-09-20 NOTE — Progress Notes (Signed)
UROLOGIC ONCOLOGY RENAL MASS NEW PATIENT NOTE    Jordan Weiss  09/20/2014    ASSESSMENT:   Newly diagnosed 1.4 cm left renal lesion   -Location:  [place]   -ECOG PS:  0   -Creatinine:  0.6 (05/2014) with eGFR >60.0    Current Disease Status: 1.4cm left enhancing kidney lesion (stable on repeat imaging in 9/15)  Current Disease Plan: Obtain CTU    H/o Gastric Bypass   -internal hernia with emergent repair in 1/15    Last Imaging:     1/15 CT AP w cont bilateral renal cysts, the largest on the right 3 cm.   8/15 CT AP w wo cont-3 cm right upper pole renal cyst, 1.4 cm hypodensity medial aspect of the mid pole left kidney, ultrasound suggested for further characterization.   8/15 RUS- 2.9 cm simple cyst upper pole right kidney.1.5 cm septated cyst upper pole right kidney. 1.4 cm lesion midpole left kidney.    PLAN:     -Obtain CT Urogram. We will call with results.   -Mr. Jordan Weiss discussed the fact that 80% of small renal masses are found to be malignant and that the primary treatment option is surgical excision via either open or laparoscopic approach. Other treatment options discussed include ablative therapies (cryoablation v radiofrequency ablation) and active surveillance. In addition, we discussed partial versus complete nephrectomy as possible treatment options. If patient opts to proceed with nephrectomy, I explained that there is a 1% chance of metastasis post-operatively. All risks and benefits discussed. At this point in time, the patient has elected to proceed with imaging studies to evaluate size of lesion and then pursue treatment option.  -RTC in 6 weeks with imaging to finalize treatment plans    SUBJECTIVE:     Chief Complaint   Patient presents with   ??? Other     Renal Lesion       HPI: Jordan Weiss is a 44 y.o. male who was referred to discuss a new diagnosis of a left renal lesion and definitive treatment options.  Patient underwent an imaging study of RUS which demonstrated a  2.9 cm  simple cyst upper pole right kidney, 1.5 cm septated cyst upper pole right kidney, and 1.4 cm lesion midpole left kidney. He c/o intermittent lower back pain- likely musculoskeletal.     S/p gastric bypass 2005- patient has lost approximately 300 pounds since his procedure.     ECOG PS: 0    Review of Systems  Constitutional: Fever: No  Skin: Rash: No  HEENT: Hearing difficulty: No  Eyes: Blurred vision: No  Cardiovascular: Chest pain: No  Respiratory: Shortness of breath: No  Gastrointestinal: Nausea/vomiting: No  Musculoskeletal: Back pain: No  Neurological: Weakness: No  Psychological: Memory loss: No  Comments/additional findings:         Past Medical History   Diagnosis Date   ??? Renal lesion        Past Surgical History   Procedure Laterality Date   ??? Hx gastric bypass  2005   ??? Hx hernia repair  2015   ??? Hx other surgical Bilateral 2012, 2014     RCT    ??? Hx other surgical Left 1989     degloved injury       Allergies   Allergen Reactions   ??? Heparin Analogues Shortness of Breath       Current Outpatient Prescriptions   Medication Sig   ??? atenolol (TENORMIN) 25 mg tablet Take 25  mg by mouth daily.   ??? hydrochlorothiazide (HYDRODIURIL) 50 mg tablet Take 25 mg by mouth daily.   ??? diltiazem hcl (CARDIZEM) 120 mg tablet Take 120 mg by mouth four (4) times daily.   ??? acetaminophen-codeine (TYLENOL #4) 300-60 mg per tablet Take 1 Tab by mouth every four (4) hours as needed for Pain.   ??? tamsulosin (FLOMAX) 0.4 mg capsule Take 0.4 mg by mouth daily.   ??? nitroglycerin (NITRODUR) 0.1 mg/hr 1 Patch by TransDERmal route daily.     No current facility-administered medications for this visit.       Family History   Problem Relation Age of Onset   ??? Diabetes Mother    ??? Cancer Father      lung   ??? Diabetes Father    ??? Cancer Mother    ??? Cancer Sister      breast   ??? Cancer Sister      cervical cancer     I have reviewed the family history and there are no changes at this time.    History     Social History    ??? Marital Status: UNKNOWN     Spouse Name: N/A   ??? Number of Children: N/A   ??? Years of Education: N/A     Social History Main Topics   ??? Smoking status: Current Some Day Smoker   ??? Smokeless tobacco: Not on file   ??? Alcohol Use: 0.0 oz/week     0 Standard drinks or equivalent per week      Comment: ocassional   ??? Drug Use: No   ??? Sexual Activity: Not on file     Other Topics Concern   ??? Not on file     Social History Narrative       OBJECTIVE:   Physical Exam:  Filed Vitals:    09/20/14 1525   BP: 132/80   Height: 6' 3"  (1.905 m)   Weight: 259 lb (117.482 kg)   Gen: NAD  HEENT: non-icteric sclerae; non-injected conjunctivae; nasal mucosa moist; oropharynx is clear; head is normocephalic; no lymphadenopathy  Neck: supple; trachea is midline  Pulm: equal respiratory effort bilaterally  CV: regular  Abd: soft, non-tender, non-distended; no hernias appreciated  Ext: no clubbing, cyanosis, edema  Back: no costovertebral angle tenderness to palpation    LABS/ IMAGING:     Results for orders placed or performed during the hospital encounter of 05/18/14   CBC WITH AUTOMATED DIFF   Result Value Ref Range    WBC 5.7 4.0 - 11.0 1000/mm3    RBC 4.18 3.80 - 5.70 M/uL    HGB 12.3 (L) 12.4 - 17.2 gm/dl    HCT 36.4 (L) 37.0 - 50.0 %    MCV 87.1 80.0 - 98.0 fL    MCH 29.4 23.0 - 34.6 pg    MCHC 33.8 30.0 - 36.0 gm/dl    PLATELET 146 140 - 450 1000/mm3    MPV 11.0 (H) 6.0 - 10.0 fL    RDW-SD 41.8 35.1 - 43.9      NRBC 0 0 - 0      IMMATURE GRANULOCYTES 0.3 0.0 - 3.0 %    NEUTROPHILS 52.9 34 - 64 %    LYMPHOCYTES 33.0 28 - 48 %    MONOCYTES 7.2 1 - 13 %    EOSINOPHILS 6.3 (H) 0 - 5 %    BASOPHILS 0.3 0 - 3 %   METABOLIC PANEL, COMPREHENSIVE   Result  Value Ref Range    Sodium 141 136 - 145 mEq/L    Potassium 3.5 3.5 - 5.1 mEq/L    Chloride 108 (H) 98 - 107 mEq/L    CO2 28 21 - 32 mEq/L    Glucose 95 74 - 106 mg/dl    BUN 13 7 - 25 mg/dl    Creatinine 0.6 0.6 - 1.3 mg/dl    GFR est AA >60      GFR est non-AA >60       Calcium 8.3 (L) 8.5 - 10.1 mg/dl    AST 15 15 - 37 U/L    ALT 22 12 - 78 U/L    Alk. phosphatase 106 45 - 117 U/L    Bilirubin, total 0.3 0.2 - 1.0 mg/dl    Protein, total 6.8 6.4 - 8.2 gm/dl    Albumin 3.7 3.4 - 5.0 gm/dl   LIPASE   Result Value Ref Range    Lipase 683 (H) 73 - 393 U/L   POC URINE MACROSCOPIC   Result Value Ref Range    Glucose Negative NEGATIVE,Negative mg/dl    Bilirubin Small (A) NEGATIVE,Negative      Ketone Negative NEGATIVE,Negative mg/dl    Specific gravity >=1.030 1.005 - 1.030      Blood Negative NEGATIVE,Negative      pH (UA) 5.5 5 - 9      Protein Negative NEGATIVE,Negative mg/dl    Urobilinogen 0.2 0.0 - 1.0 EU/dl    Nitrites Negative NEGATIVE,Negative      Leukocyte Esterase Negative NEGATIVE,Negative      Color Amber      Appearance Clear         05/19/2014 12:04 AM - [facility]   ??   Component Results    ?? Component Value Flag Ref Range Units Status   ?? WBC 5.7  4.0 - 11.0  1000/mm3 Final   ?? RBC 4.18  3.80 - 5.70  M/uL Final   ?? HGB 12.3 (L) 12.4 - 17.2  gm/dl Final   ?? HCT 36.4 (L) 37.0 - 50.0  % Final   ?? MCV 87.1  80.0 - 98.0  fL Final   ?? MCH 29.4  23.0 - 34.6  pg Final   ?? MCHC 33.8  30.0 - 36.0  gm/dl Final   ?? PLATELET 146  140 - 450  1000/mm3 Final   ?? MPV 11.0 (H) 6.0 - 10.0  fL Final   ?? RDW-SD 41.8  35.1 - 43.9  ?? Final   ?? NRBC 0  0 - 0  ?? Final   ?? IMMATURE GRANULOCYTES 0.3  0.0 - 3.0  % Final   ?? Comment:    ?? IG - Immature granulocytes (promyelocytes + myelocytes + metamyelocytes), their presence   indicates a left shift. An IG > 3% may predict positive blood cultures with 98% specificity.   (P<0.04) and 92% Positive Predictive Value (Ansari-Lari)1. Increased immature granulocytes   assist with the detection of infection and/or inflammation and may be present at an early stage   and are more sensitive and specific than band counts.      ?? NEUTROPHILS 52.9  34 - 64  % Final   ?? LYMPHOCYTES 33.0  28 - 48  % Final   ?? MONOCYTES 7.2  1 - 13  % Final    ?? EOSINOPHILS 6.3 (H) 0 - 5  % Final   ?? BASOPHILS 0.3  0 - 3  %  Final     05/19/2014 12:24 AM - [facility]   ??   Component Results    ?? Component Value Flag Ref Range Units Status   ?? Sodium 141  136 - 145  mEq/L Final   ?? Potassium 3.5  3.5 - 5.1  mEq/L Final   ?? Chloride 108 (H) 98 - 107  mEq/L Final   ?? CO2 28  21 - 32  mEq/L Final   ?? Glucose 95  74 - 106  mg/dl Final   ?? BUN 13  7 - 25  mg/dl Final   ?? Creatinine 0.6  0.6 - 1.3  mg/dl Final   ?? GFR est AA >60   ?? Final   ?? Comment:    ?? THE [facility] WORKING GROUP STATES THAT THE MDRD STUDY EQUATION SHOULD ONLY BE USED ON   INDIVIDUALS 18 OR OLDER. THE REPORT ALSO NOTES THAT THE MDRD STUDY EQUATION HAS NOT BEEN   VALIDATED FOR USE WITH THE ELDERLY (OVER 10 YEARS OF AGE), PREGNANT WOMEN, PATIENTS WITH SERIOUS   COMORBID CONDITIONS, OR PERSONS WITH EXTREMES OF BODY SIZE, MUSCLE MASS, OR NUTRITIONAL STATUS.   APPLICATION OF THE EQUATION TO THESE PATIENT GROUPS MAY LEAD TO ERRORS IN GFR ESTIMATION. GFR   ESTIMATING EQUATIONS HAVE POORER AGREEMENT WITH MEASURED GFR FOR ILL HOSPITALIZED PATIENTS AND   FOR PEOPLE WITH NEAR NORMAL KIDNEY FUNCTION THAN FOR SUBJECTS IN THE MDRD STUDY. VALIDATION   STUDIES ARE IN PROGRESS TO EVALUATE THE MDRD STUDY EQUATION FOR ADDITIONAL ETHNIC GROUPS, THE   ELDERLY, VARIOUS DISEASE CONDITIONS, AND PEOPLE WITH NORMAL KIDNEY FUNCTION.   GFRA----REFERS TO AFRICAN AMERICAN   GFRO---REFERS TO OTHER RACES   REFERENCES AVAILABLE UPON REQUEST.      ?? GFR est non-AA >60   ?? Final   ?? Calcium 8.3 (L) 8.5 - 10.1  mg/dl Final   ?? AST 15  15 - 37  U/L Final   ?? ALT 22  12 - 78  U/L Final   ?? Alk. phosphatase 106  45 - 117  U/L Final   ?? Bilirubin, total 0.3  0.2 - 1.0  mg/dl Final   ?? Protein, total 6.8  6.4 - 8.2  gm/dl Final   ?? Albumin 3.7  3.4 - 5.0  gm/dl Final   ??           Christena Deem. Jimmye Norman, MD, [place]   Urologic Oncology  [facility] Professor of Urology  Department of Urology   [facility] [address] [place], [place]     Pager:  [phone]  Office:  [phone]         ICD-10-CM ICD-9-CM    1. Kidney lesion, native, left N28.9 593.9 CT UROGRAM W WO CONT       A copy of today's office visit with all pertinent imaging results and labs were sent to [name], MD for review.          Documentation provided by Jennelle Human, medical scribe for Renford Dills, MD.

## 2014-09-22 NOTE — Telephone Encounter (Signed)
Patient's wife Tomi Bamberger ( [employer]) called about scheduling or CT urogram at [facility]- offered to fax the order then she can call to schedule- she prefers that Dr. Jimmye Norman' staff schedule the appt then call her.

## 2014-09-25 ENCOUNTER — Encounter

## 2014-10-03 ENCOUNTER — Inpatient Hospital Stay: Admit: 2014-10-03 | Payer: MEDICARE | Attending: Urology | Primary: Physician Assistant

## 2014-10-03 DIAGNOSIS — N281 Cyst of kidney, acquired: Secondary | ICD-10-CM

## 2014-10-03 MED ORDER — IOPAMIDOL 76 % IV SOLN
370 mg iodine /mL (76 %) | Freq: Once | INTRAVENOUS | Status: AC
Start: 2014-10-03 — End: 2014-10-03
  Administered 2014-10-03: 14:00:00 via INTRAVENOUS

## 2014-10-03 MED FILL — ISOVUE-370  76 % INTRAVENOUS SOLUTION: 370 mg iodine /mL (76 %) | INTRAVENOUS | Qty: 80

## 2014-10-03 NOTE — Discharge Summary (Signed)
PATIENT NAME:  Andrew GrillsCREMEANS, Venancio MR#:  [id number] DATE OF BIRTH:  [birth year]  DATE OF ADMISSION:  02/26/2012 DATE OF DISCHARGE:  02/26/2012  DISCHARGE DIAGNOSES:   1. Chest pain concerning for unstable angina. 2. Malignant hypertension. 3. Diabetes. 4. History of cerebrovascular accident. 5. Tobacco abuse.   DISCHARGE MEDICATIONS: None since the patient signed out Against Medical Advice.   HOSPITAL COURSE: The patient was admitted on 02/26/2012 with unstable angina, seen in consultation by Dr. Adrian BlackwaterShaukat Khan, Cardiology, who recommended a cardiac catheterization. First set of enzymes was negative. The patient was admitted to the floor, got to the floor, and then signed out Against Medical Advice. The risk of death was explained. We were unable to convince the patient to stay for further treatment and diagnosis.    TIME SPENT ON PATIENT CARE TODAY: Total of 60 minutes.  ____________________________ Herschell Dimesichard J. Renae GlossWieting, MD rjw:cbb D: 02/26/2012 14:41:09 ET T: 02/27/2012 09:48:35 ET JOB#: [id number]  cc: Herschell Dimesichard J. Renae GlossWieting, MD, <Dictator> Salley ScarletICHARD J Chalsey Leeth MD ELECTRONICALLY SIGNED 02/27/2012 16:53

## 2014-10-03 NOTE — Consult Note (Signed)
PATIENT NAME:  Andrew Grant, Andrew Grant MR#:  [id number] DATE OF BIRTH:  1970-08-18  DATE OF CONSULTATION:  02/26/2012  REFERRING PHYSICIAN:   CONSULTING PHYSICIAN:  Andrew NancyShaukat A. Megahn Killings, MD  INDICATION FOR CONSULTATION: Chest pain.   HISTORY OF PRESENT ILLNESS: This is a 44 year old white male with a past medical history of two myocardial infarctions and cerebrovascular accident and PCI and stenting at [facility], came into the hospital with chest pain described as pressure-type since 10:00 last night. Chest pain was 7/10. Right now he denies any chest pain. He says the chest pain eased off.    PAST MEDICAL HISTORY:  1. History of hypertension. 2. Borderline diabetes mellitus. 3. Hypercholesterolemia.  4. History of myocardial infarction in 1998 requiring stenting. He does not know which vessel or how many vessels at [facility]. He had myocardial infarction at that time.  5. He also says in 1994 he had a CVA but he does not have any residual defect. He thinks it may have been transient ischemic attack.   SOCIAL HISTORY: He smokes half pack per day. Denies EtOH abuse or cocaine use.   FAMILY HISTORY: Mother had congestive heart failure and myocardial infarction.   PHYSICAL EXAMINATION:  GENERAL: He is alert, oriented x3, in no acute distress.   VITAL SIGNS: Stable.   NECK: No JVD.   LUNGS: Clear.   HEART: Regular rate, rhythm. Normal S1, S2. No audible murmur.   ABDOMEN: Soft, nontender, positive bowel sounds.   EXTREMITIES: No pedal edema.   LABORATORY, DIAGNOSTIC AND RADIOLOGICAL DATA: EKG shows normal sinus rhythm, 68 beats per minute. No acute changes. Troponin 0.02, BUN and creatinine 9/0.6.   ASSESSMENT: Unstable angina.   PLAN: To do left heart catheterization in a.m.   ____________________________ Andrew NancyShaukat A. Dearies Meikle, MD sak:cms D: 02/26/2012 11:03:24 ET T: 02/26/2012 11:20:34 ET JOB#: [id number]  cc: Andrew NancyShaukat A. Jaremy Nosal, MD, <Dictator>  Andrew NancySHAUKAT A Jace Dowe MD ELECTRONICALLY  SIGNED 03/22/2012 15:05

## 2014-10-03 NOTE — H&P (Signed)
PATIENT NAME:  Andrew Grant, Andrew Grant DATE OF BIRTH:  06/26/1970  DATE OF ADMISSION:  02/26/2012  PRIMARY CARE PHYSICIAN IN THE PAST: [facility]. Jaclyn PrimeSteven Solomon [facility], last seen about 15 months ago [place].   CHIEF COMPLAINT: Chest pain.   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 last night, also sharp feeling in the center of his chest, left arm association, nausea, sweating, and shortness of breath, pain not too severe. He took two nitroglycerin at home and knocked out the pain. Some of the pain happened while he was at rest. He has been under a lot of stress lately going through a divorce and working 110 hours per week. Last heart cath was about eight years ago. Hospitalist services were contacted for further evaluation. In the Emergency Room the patient was thinking about signing out AGAINST MEDICAL ADVICE. I offered him to sign out a medical advice versus admission. He actually chose admission on the hospitalization.   PAST MEDICAL HISTORY:  1. Hypertension. 2. WPW. 3. Diabetes. 4. Degenerative joint disease. 5. Coronary artery disease.  6. Cerebrovascular accident. 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   PAST SURGICAL HISTORY:  1. Gastric bypass. 2. Left hand degloving. 3. Right shoulder surgery.   ALLERGIES: Heparin causes a breakout and bone pain.   MEDICATIONS:  1. Atenolol 50 mg daily.  2. Diltiazem ER 120 mg daily.  3. Enalapril 5 mg daily.  4. Hydrochlorothiazide 12.5 mg daily.  5. Multivitamin daily.  6. NitroQuick p.r.n.   SOCIAL HISTORY: Quit smoking yesterday. No alcohol. No drug use. 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   FAMILY HISTORY: Father died at 272 of lung cancer, had hypertension. Mother died at 6388 of cellulitis. Brother with sarcoma. Brothers and  sisters with hypertension and diabetes.   REVIEW OF SYSTEMS: CONSTITUTIONAL: Positive for sweating. No fever, chills, or sweats. Positive for weakness. Positive for weight loss. EYES: No eye problems. EARS, NOSE, MOUTH, AND THROAT: No hearing loss. No sore throat. No difficulty swallowing. CARDIOVASCULAR: Positive for chest pain. No palpitations. RESPIRATORY: No shortness of breath. No coughing. No sputum. No hemoptysis. GASTROINTESTINAL: Positive for nausea. No vomiting. No abdominal pain. No diarrhea. No constipation. No bright red blood per rectum. GENITOURINARY: No burning on urination. No hematuria. MUSCULOSKELETAL: Positive for joint pains all over. INTEGUMENTARY: No rashes or eruptions. NEUROLOGIC: No fainting or blackouts. PSYCHIATRIC: No anxiety or depression. ENDOCRINE: No thyroid problems. HEMATOLOGIC/LYMPHATIC: No anemia. No easy bruising or bleeding.   PHYSICAL EXAMINATION:   VITAL SIGNS: Temperature 97.9, pulse 62, respirations 18, blood pressure 148/80, pulse oximetry 100% on room air. On presentation to the Emergency Room blood pressure was 177/116.   GENERAL: No respiratory distress.   EYES: Conjunctivae bloodshot. Lids normal. Pupils equal, round, and reactive to light. Extraocular muscles intact. No nystagmus.   EARS, NOSE, MOUTH, AND THROAT: Tympanic membranes no erythema. Nasal mucosa no erythema. Throat no erythema. No exudate seen. Lips and gums normal.   NECK: No JVD. No bruits. No lymphadenopathy. No thyromegaly. No thyroid nodules palpated.   RESPIRATORY: Lungs clear to auscultation. No use of accessory muscles to breathe. No rhonchi, rales, or wheeze heard.   CARDIOVASCULAR: S1, S2 normal. No gallops, rubs, or murmurs heard. Carotid upstroke 2+ bilaterally. No bruits. Dorsalis  pedis pulses 2+ bilaterally. No edema of the lower extremity.   ABDOMEN: Soft, nontender. No organomegaly/splenomegaly. Normoactive bowel sounds. No masses felt.   LYMPHATIC: No lymph nodes in  the neck.   MUSCULOSKELETAL: No clubbing, edema, or cyanosis. Left hand deformity from surgery.   SKIN: No rashes or ulcers seen.   NEUROLOGIC: Cranial nerves II through XII grossly intact. Deep tendon reflexes 2+ bilateral lower extremities.   PSYCHIATRIC: The patient is oriented to person, place, and time.  LABORATORY, DIAGNOSTIC, AND RADIOLOGICAL DATA: EKG showed normal sinus rhythm, no acute ST-T wave changes.  First enzymes negative. Glucose 117, BUN 9, creatinine 0.6, sodium 139, potassium 3.8, chloride 106, CO2 25, calcium 9.1. Liver function tests normal. White blood cell count 7.1, hemoglobin and hematocrit 14.0 and 41.1, platelet count 165.   Chest x-ray no acute abnormality.   ASSESSMENT AND PLAN:  1. Chest pain concerning for unstable angina with history of coronary artery disease. I advised the patient to stay and get treatment while he is here and to rule out possible causes for his chest pain. The patient thinks it's stress and was considering signing out AGAINST MEDICAL ADVICE. I will continue his aspirin and atenolol. The patient is allergic to heparin. Will put on telemetry. Get serial cardiac enzymes and admit as an observation. Dr. Welton Flakes of Cardiology to discuss more aggressive treatment with cardiac cath versus stress test based on second enzymes.  2. Malignant hypertension. Restart his oral home medications.  3. Diabetes. He is on diet.  4. History of cerebrovascular accident. He is on aspirin.  5. Tobacco abuse. Smoking cessation counseling done, three minutes by me. Nicotine patch offered.  TIME SPENT ON ADMISSION: 50 minutes.   ____________________________ Herschell Dimes. Renae Gloss, MD rjw:drc D: 02/26/2012 14:39:00 ET T: 02/26/2012 14:51:59 ET JOB#: [id number]  cc: Herschell Dimes. Renae Gloss, MD, <Dictator> Salley Scarlet MD ELECTRONICALLY SIGNED 02/27/2012 16:53

## 2014-11-01 ENCOUNTER — Ambulatory Visit: Admit: 2014-11-01 | Discharge: 2014-11-01 | Payer: MEDICARE | Attending: Urology | Primary: Physician Assistant

## 2014-11-01 DIAGNOSIS — N2889 Other specified disorders of kidney and ureter: Secondary | ICD-10-CM

## 2014-11-01 LAB — AMB POC URINALYSIS DIP STICK AUTO W/O MICRO
Bilirubin (UA POC): NEGATIVE
Blood (UA POC): NEGATIVE
Glucose (UA POC): NEGATIVE
Ketones (UA POC): NEGATIVE
Leukocyte esterase (UA POC): NEGATIVE
Nitrites (UA POC): NEGATIVE
Protein (UA POC): NEGATIVE mg/dL
Specific gravity (UA POC): 1.025 (ref 1.001–1.035)
Urobilinogen (UA POC): 0.2 (ref 0.2–1)
pH (UA POC): 6 (ref 4.6–8.0)

## 2014-11-01 NOTE — Progress Notes (Signed)
UROLOGIC ONCOLOGY RENAL MASS FOLLOW UP NOTE    Jordan Weiss  11/01/2014    ASSESSMENT:     1. Newly diagnosed 1.4 cm left renal lesion   -Location:  [place]   -ECOG PS:  0   -Creatinine:  0.6 (05/2014) with eGFR >60.0    Current Disease Status: 1.6 left midpole lesion   Current Disease Plan: To OR for left open partial nephrectomy     2. H/o Gastric Bypass   -internal hernia with emergent repair in 1/15    Last Imaging:     1/15 CT AP w cont bilateral renal cysts, the largest on the right 3 cm.  8/15 CT AP w wo cont-3 cm right upper pole renal cyst, 1.4 cm hypodensity medial aspect of the mid pole left kidney, ultrasound suggested for further characterization.  8/15 RUS- 2.9 cm simple cyst upper pole right kidney.1.5 cm septated cyst upper pole right kidney. 1.4 cm lesion midpole left kidney.  4/16 CT AP w wo cont- 3 and 1.6 cm right renal cysts and 1.6 cm left midpole lesion. No significant change in the size or enhancement of the kidney lesion.    PLAN:     -Reviewed CT AP w wo cont with patient. At this time, patient would like to proceed with surgical intervention.   Jordan Weiss and I discussed the risks of the proposed partial nephrectomy.  I indicated that the risks include but are not limited to:  Infection, hemorrhage, pneumothorax with possible need for chest tube, bowel injury, incisional hernia, deep vein thrombosis, pulmonary embolism, MI, and death.  Jordan Weiss asked and I answered questions relating to these risks, possible benefits and alternatives to treatment inlcuding no treatment. All questions have been answered to their level of satisfaction.  -He will meet with his PCP for a pre-operative consult.   -RTC 4-6 weeks post-operatively for pathology review.     SUBJECTIVE:     Chief Complaint   Patient presents with   ??? Other     Kidney Lesion        HPI: Jordan Weiss is a 44 y.o. male who was diagnosed with a left renal lesion and elected active surveillance. Since last visit, patient has been doing well and desires to pursue surgical intervention for the excision of known lesion. No f/n/c/v. History of HTN.     ECOG PS: 0    Review of Systems  Constitutional: Fever: No  Skin: Rash: No  HEENT: Hearing difficulty: No  Eyes: Blurred vision: No  Cardiovascular: Chest pain: No  Respiratory: Shortness of breath: No  Gastrointestinal: Nausea/vomiting: No  Musculoskeletal: Back pain: Yes  Neurological: Weakness: No  Psychological: Memory loss: No  Comments/additional findings:       Past Medical History   Diagnosis Date   ??? Renal lesion        Past Surgical History   Procedure Laterality Date   ??? Hx gastric bypass  2005   ??? Hx hernia repair  2015   ??? Hx other surgical Bilateral 2012, 2014     RCT    ??? Hx other surgical Left 1989     degloved injury       Allergies   Allergen Reactions   ??? Heparin Analogues Shortness of Breath       Current Outpatient Prescriptions   Medication Sig   ??? oxyCODONE (OXYIR) 5 mg capsule Take 5 mg by mouth every four (4) hours as needed.   ??? oxyCODONE CR (OXYCONTIN)  20 mg CR tablet Take  by mouth every twelve (12) hours.   ??? atenolol (TENORMIN) 25 mg tablet Take 25 mg by mouth daily.   ??? hydrochlorothiazide (HYDRODIURIL) 50 mg tablet Take 25 mg by mouth daily.   ??? diltiazem hcl (CARDIZEM) 120 mg tablet Take 120 mg by mouth four (4) times daily.   ??? tamsulosin (FLOMAX) 0.4 mg capsule Take 0.4 mg by mouth daily.   ??? nitroglycerin (NITRODUR) 0.1 mg/hr 1 Patch by TransDERmal route daily.     No current facility-administered medications for this visit.       Family History   Problem Relation Age of Onset   ??? Diabetes Mother    ??? Cancer Father      lung   ??? Diabetes Father    ??? Cancer Mother    ??? Cancer Sister      breast   ??? Cancer Sister      cervical cancer   I have reviewed the family history and there are no changes at this time.       History     Social History   ??? Marital Status: MARRIED     Spouse Name: N/A   ??? Number of Children: N/A   ??? Years of Education: N/A     Social History Main Topics   ??? Smoking status: Current Some Day Smoker   ??? Smokeless tobacco: Not on file   ??? Alcohol Use: 0.0 oz/week     0 Standard drinks or equivalent per week      Comment: ocassional   ??? Drug Use: No   ??? Sexual Activity: Not on file     Other Topics Concern   ??? None     Social History Narrative         OBJECTIVE:   Physical Exam:  Filed Vitals:    11/01/14 1428   BP: 130/78   Height: 6' 3"  (1.905 m)   Weight: 249 lb (112.946 kg)     Gen: NAD   Pulm: no respiratory distress  CV: regular   Abd: soft, non-distended, nontender, no masses  Ext: No cyanosis or edema   Back: no CVA Tenderness  Neuro: Grossly intact   Skin: warm and dry     LABS/ IMAGING:     Results for orders placed or performed in visit on 11/01/14   AMB POC URINALYSIS DIP STICK AUTO W/O MICRO   Result Value Ref Range    Color (UA POC)      Clarity (UA POC)      Glucose (UA POC) Negative Negative    Bilirubin (UA POC) Negative Negative    Ketones (UA POC) Negative Negative    Specific gravity (UA POC) 1.025 1.001 - 1.035    Blood (UA POC) Negative Negative    pH (UA POC) 6.0 4.6 - 8.0    Protein (UA POC) Negative Negative mg/dL    Urobilinogen (UA POC) 0.2 mg/dL 0.2 - 1    Nitrites (UA POC) Negative Negative    Leukocyte esterase (UA POC) Negative Negative     CT AP W WO CONT 10/03/14  Clinical history: Left renal mass  ??  EXAMINATION:  CT scan of the abdomen and pelvis without and with contrast, delayed imaging  10/03/2014. 5 mm spiral scanning is performed from the costophrenic angles to the  symphysis pubis. Coronal and sagittal reconstruction imaging has been performed.  ??  Correlation: CT scan 02/04/2014 and 06/20/2013.  ??  FINDINGS:  Lung bases are clear. Linear subsegmental atelectasis left lung base.  Degenerative changes of the spine and hips.  ??   Liver surface demonstrates nodularity suggestive of cirrhosis. Spleen is  prominent. Pancreas and adrenal glands are unremarkable.  ??  3 and 1.6 cm right renal cysts. 1.5 cm left midpole lesion. These are not  significantly changed from 02/04/2014. Right renal cysts are unchanged from  06/20/2013, left renal lesion is difficult to visualize on this study of 06/20/2013.  ??  Bladder, prostate and seminal vesicles are unremarkable. Patient is status post  gastric bypass surgery. Small bowel loops, terminal ileum and appendix are  unremarkable.  ??  Abdominal aorta is within normal limits. No dominant lymph node enlargement or  free fluid. Improvement in mesenteric adenopathy and congestion.  ??  IMPRESSION:  1. 3 and 1.6 cm right renal cysts, unchanged from 02/04/2014 and 06/20/2013.  2. 1.6 cm left midpole lesion, not significantly changed from 02/04/2014, not  clearly identified on 06/20/2013. ??  3. Gastric bypass surgery.  4. Subtle liver surface nodularity suggestive of cirrhosis.          Christena Deem. Jimmye Norman, MD, [facility]   Urologic Oncology  [facility] Professor of Urology  Department of Urology  [facility] [place], [place]     Pager:  [phone]  Office:  [phone]         ICD-10-CM ICD-9-CM    1. Renal mass, left N28.89 593.9    2. Renal lesion N28.9 593.9 oxyCODONE (OXYIR) 5 mg capsule      oxyCODONE CR (OXYCONTIN) 20 mg CR tablet      AMB POC URINALYSIS DIP STICK AUTO W/O MICRO       A copy of today's office visit with all pertinent imaging results and labs were sent to [name], MD for review.      Documentation provided by Jennelle Human, medical scribe for Renford Dills, MD.

## 2014-11-02 ENCOUNTER — Encounter: Attending: Urology | Primary: Physician Assistant

## 2014-12-20 ENCOUNTER — Encounter: Attending: Urology | Primary: Physician Assistant

## 2015-01-17 ENCOUNTER — Encounter: Attending: Urology | Primary: Physician Assistant

## 2015-02-07 ENCOUNTER — Encounter: Attending: Urology | Primary: Physician Assistant

## 2015-03-05 ENCOUNTER — Telehealth: Payer: Self-pay | Admitting: Family Medicine

## 2015-03-12 ENCOUNTER — Encounter

## 2015-03-13 ENCOUNTER — Inpatient Hospital Stay: Payer: MEDICARE | Primary: Physician Assistant

## 2015-05-04 NOTE — Telephone Encounter (Signed)
Called pt to see if he wants to come in and be seen due to no showing his last couple appts. Pt was unavailable. Left voicemail for pt to call me back, also sent message to [facility] letting her know pt needs to be scheduled.

## 2015-08-12 ENCOUNTER — Emergency Department: Admit: 2015-08-12 | Payer: MEDICARE | Primary: Physician Assistant

## 2015-08-12 ENCOUNTER — Inpatient Hospital Stay
Admit: 2015-08-12 | Discharge: 2015-08-12 | Disposition: A | Discharge status: Home / Self Care | Payer: MEDICARE | Attending: Internal Medicine

## 2015-08-12 DIAGNOSIS — M545 Low back pain: Secondary | ICD-10-CM

## 2015-08-12 MED ORDER — HYDROCODONE-ACETAMINOPHEN 5 MG-325 MG TAB
5-325 mg | ORAL_TABLET | ORAL | 0 refills | Status: DC | PRN
Start: 2015-08-12 — End: 2015-11-22

## 2015-08-12 MED ORDER — ONDANSETRON 4 MG TAB, RAPID DISSOLVE
4 mg | ORAL | Status: AC
Start: 2015-08-12 — End: 2015-08-12
  Administered 2015-08-12: via ORAL

## 2015-08-12 MED ORDER — KETOROLAC TROMETHAMINE 60 MG/2 ML IM
60 mg/2 mL | INTRAMUSCULAR | Status: AC
Start: 2015-08-12 — End: 2015-08-12
  Administered 2015-08-12: via INTRAMUSCULAR

## 2015-08-12 MED ORDER — METHYLPREDNISOLONE 4 MG TABS IN A DOSE PACK
4 mg | ORAL | 0 refills | Status: DC
Start: 2015-08-12 — End: 2015-11-22

## 2015-08-12 MED ORDER — TRAMADOL 50 MG TAB
50 mg | ORAL_TABLET | Freq: Four times a day (QID) | ORAL | 0 refills | Status: DC | PRN
Start: 2015-08-12 — End: 2015-08-12

## 2015-08-12 MED FILL — KETOROLAC TROMETHAMINE 60 MG/2 ML IM: 60 mg/2 mL | INTRAMUSCULAR | Qty: 2

## 2015-08-12 MED FILL — ONDANSETRON 4 MG TAB, RAPID DISSOLVE: 4 mg | ORAL | Qty: 1

## 2015-08-12 NOTE — ED Notes (Signed)
Patient armband removed and shredded  I have reviewed discharge instructions with the patient.  The patient verbalized understanding.
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
## 2015-08-12 NOTE — ED Triage Notes (Signed)
Sepsis Screening completed    (  )Patient meets SIRS criteria.  (x  )Patient does not meet SIRS criteria.      SIRS Criteria is achieved when two or more of the following are present  ? Temperature < 96.8??F (36??C) or > 100.9??F (38.3??C)  ? Heart Rate > 90 beats per minute  ? Respiratory Rate > 20 breaths per minute  ? WBC count > 12,000 or <4,000 or > 10% bands
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
## 2015-11-22 ENCOUNTER — Emergency Department: Admit: 2015-11-23 | Payer: Self-pay | Primary: Physician Assistant

## 2015-11-22 ENCOUNTER — Inpatient Hospital Stay
Admit: 2015-11-22 | Discharge: 2015-11-23 | Disposition: A | Discharge status: Home / Self Care | Payer: Self-pay | Attending: Emergency Medicine

## 2015-11-22 DIAGNOSIS — S20212A Contusion of left front wall of thorax, initial encounter: Secondary | ICD-10-CM

## 2015-11-22 NOTE — ED Triage Notes (Signed)
Pain left lateral rib cage with eccymosis, states fell approx 6 feet from ladder 3 days ago.

## 2015-11-22 NOTE — ED Provider Notes (Signed)
[facility]  EMERGENCY DEPARTMENT HISTORY AND PHYSICAL EXAM       Date: 11/22/2015   Patient Name: Jordan Weiss   Date of Birth: 05/03/1971  Medical Record Number: [id number]    History of Presenting Illness     Chief Complaint   Patient presents with   ??? Fall   ??? Rib Pain        History Provided By:  patient    Additional History: 9:18 PM   Hrag Litaker is a 45 y.o. male who presents to the emergency department C/O left sided rib pain onset after falling off a 6 foot ladder 2 days ago. Pt reports due to the pain he has been unable to sleep and has pain with deep breath. Pt denies abd pain or hematuria.     Primary Care Provider: [name], MD   Specialist:    Past History     Past Medical History:   Past Medical History:   Diagnosis Date   ??? CAD (coronary artery disease)    ??? Hypertension    ??? Renal lesion         Past Surgical History:   Past Surgical History:   Procedure Laterality Date   ??? HX GASTRIC BYPASS  2005   ??? HX HERNIA REPAIR  2015   ??? HX OTHER SURGICAL Bilateral 2012, 2014    RCT    ??? HX OTHER SURGICAL Left 1989    degloved injury        Family History:   Family History   Problem Relation Age of Onset   ??? Diabetes Mother    ??? Cancer Mother    ??? Cancer Father      lung   ??? Diabetes Father    ??? Cancer Sister      breast   ??? Cancer Sister      cervical cancer        Social History:   Social History   Substance Use Topics   ??? Smoking status: Former Smoker   ??? Smokeless tobacco: None   ??? Alcohol use 0.0 oz/week     0 Standard drinks or equivalent per week      Comment: ocassional        Allergies:   Allergies   Allergen Reactions   ??? Heparin Analogues Shortness of Breath        Review of Systems   Review of Systems   Respiratory: Negative for shortness of breath.    Gastrointestinal: Negative for abdominal pain.   Genitourinary: Negative for hematuria.   Musculoskeletal: Positive for arthralgias. Negative for neck pain.    Neurological: Negative for dizziness, syncope, weakness, light-headedness, numbness and headaches.   All other systems reviewed and are negative.      Physical Exam  Vitals:    11/22/15 1947   BP: 169/82   Pulse: 68   Resp: 18   Temp: 98.1 ??F (36.7 ??C)   SpO2: 99%   Weight: 104.3 kg (230 lb)   Height: 6\' 3"  (1.905 m)       Physical Exam   Constitutional: He appears well-developed and well-nourished. No distress.   HENT:   Head: Normocephalic and atraumatic.   Eyes: Conjunctivae and EOM are normal. Pupils are equal, round, and reactive to light.   Neck: Normal range of motion. Neck supple.   Cardiovascular: Normal rate and regular rhythm.    Pulmonary/Chest: Effort normal and breath sounds normal.   Abdominal: Soft.  Bowel sounds are normal. There is no tenderness.   Musculoskeletal: Normal range of motion.   Neurological: He is alert.   Skin: Skin is warm, dry and intact. Bruising noted. No laceration noted.        Psychiatric: He has a normal mood and affect. His behavior is normal.   Nursing note and vitals reviewed.        Diagnostic Study Results     Labs -    No results found for this or any previous visit (from the past 12 hour(s)).    Radiologic Studies -  The following have been ordered and reviewed:  XR RIBS LT W PA CXR MIN 3 V    (Results Pending)   10:25 PM  RADIOLOGY FINDINGS  Left rib X-ray shows NAP  Pending review by Radiologist  Recorded by Gilford Silvius , ED Scribe, as dictated by Clement Husbands, PA-C     Medical Decision Making   I am the first provider for this patient.     I reviewed the vital signs, available nursing notes, past medical history, past surgical history, family history and social history.     Vital Signs-Reviewed the patient's vital signs.   Patient Vitals for the past 12 hrs:   Temp Pulse Resp BP SpO2   11/22/15 1947 98.1 ??F (36.7 ??C) 68 18 169/82 99 %     Procedures:   Procedures    ED Course:  9:18 PM  Initial assessment performed. The patients presenting problems have been  discussed, and they are in agreement with the care plan formulated and outlined with them.  I have encouraged them to ask questions as they arise throughout their visit.    Medications Given in the ED:  Medications   HYDROcodone-acetaminophen (NORCO) 5-325 mg per tablet 2 Tab (2 Tabs Oral Given 11/22/15 2157)       Discharge Note:  10:26 PM  Pt has been reexamined. Patient has no new complaints, changes, or physical findings.  Care plan outlined and precautions discussed.  Results were reviewed with the patient. All medications were reviewed with the patient; will d/c home. All of pt's questions and concerns were addressed. Patient was instructed and agrees to follow up with PCP, as well as to return to the ED upon further deterioration. Patient is ready to go home.    Diagnosis   Clinical Impression:   1. Fall, initial encounter    2. Chest wall contusion, left, initial encounter         Discussion:    Follow-up Information     Follow up With Details Comments [place], MD Schedule an appointment as soon as possible for a visit in 2 days  [address]  [place] [address]  [phone]      [facility] EMERGENCY DEPT  As needed, If symptoms worsen [address]  [place] [address]  [phone]          Current Discharge Medication List      START taking these medications    Details   HYDROcodone-acetaminophen (NORCO) 5-325 mg per tablet Take 1 Tab by mouth every four (4) hours as needed for Pain. Max Daily Amount: 6 Tabs.  Qty: 20 Tab, Refills: 0             _______________________________   Attestations:     This note is prepared by Gilford Silvius , acting as a Education administrator for [employer], PA-C  on 9:17 PM on 11/22/2015 .    Elah Avellino, PA-C: The scribe's documentation has been prepared under my direction and personally reviewed by me in its entirety.  _______________________________

## 2015-11-22 NOTE — ED Notes (Signed)
Pt gowned, pillow placed for comfort, HOB elevated

## 2015-11-22 NOTE — ED Notes (Signed)
Patient armband removed and shreddedI have reviewed discharge instructions with the patient and spouse.  The patient and spouse verbalized understanding. rx x 1 given;

## 2015-11-23 MED ORDER — HYDROCODONE-ACETAMINOPHEN 5 MG-325 MG TAB
5-325 mg | ORAL_TABLET | ORAL | 0 refills | Status: DC | PRN
Start: 2015-11-23 — End: 2016-05-12

## 2015-11-23 MED ORDER — HYDROCODONE-ACETAMINOPHEN 5 MG-325 MG TAB
5-325 mg | ORAL | Status: AC
Start: 2015-11-23 — End: 2015-11-22
  Administered 2015-11-23: 02:00:00 via ORAL

## 2015-11-23 MED FILL — HYDROCODONE-ACETAMINOPHEN 5 MG-325 MG TAB: 5-325 mg | ORAL | Qty: 2

## 2016-04-07 ENCOUNTER — Encounter: Attending: Physician Assistant | Primary: Physician Assistant

## 2016-04-14 ENCOUNTER — Encounter: Attending: Physician Assistant | Primary: Physician Assistant

## 2016-05-12 ENCOUNTER — Ambulatory Visit
Admit: 2016-05-12 | Discharge: 2016-05-12 | Payer: PRIVATE HEALTH INSURANCE | Attending: Family Medicine | Primary: Physician Assistant

## 2016-05-12 DIAGNOSIS — C642 Malignant neoplasm of left kidney, except renal pelvis: Secondary | ICD-10-CM

## 2016-05-12 LAB — AMB POC URINALYSIS DIP STICK MANUAL W/O MICRO
Bilirubin (UA POC): NEGATIVE
Blood (UA POC): NEGATIVE
Glucose (UA POC): NEGATIVE
Ketones (UA POC): NEGATIVE
Leukocyte esterase (UA POC): NEGATIVE
Nitrites (UA POC): NEGATIVE
Protein (UA POC): NEGATIVE
Specific gravity (UA POC): 1.025 (ref 1.001–1.035)
Urobilinogen (UA POC): 0.2 (ref 0.2–1)
pH (UA POC): 5.5 (ref 4.6–8.0)

## 2016-05-12 MED ORDER — ENALAPRIL MALEATE 10 MG TAB
10 mg | ORAL_TABLET | Freq: Every day | ORAL | 3 refills | Status: DC
Start: 2016-05-12 — End: 2016-09-25

## 2016-05-12 MED ORDER — NITROGLYCERIN 0.4 MG SUBLINGUAL TAB
0.4 mg | SUBLINGUAL | 99 refills | Status: AC | PRN
Start: 2016-05-12 — End: ?

## 2016-05-12 MED ORDER — HYDROCHLOROTHIAZIDE 25 MG TAB
25 mg | ORAL_TABLET | Freq: Every day | ORAL | 3 refills | Status: AC
Start: 2016-05-12 — End: ?

## 2016-05-12 MED ORDER — ATENOLOL 25 MG TAB
25 mg | ORAL_TABLET | Freq: Every day | ORAL | 3 refills | Status: AC
Start: 2016-05-12 — End: ?

## 2016-05-12 MED ORDER — TAMSULOSIN SR 0.4 MG 24 HR CAP
0.4 mg | ORAL_CAPSULE | Freq: Every day | ORAL | 3 refills | Status: DC
Start: 2016-05-12 — End: 2018-06-11

## 2016-05-12 MED ORDER — DILTIAZEM ER 120 MG 24 HR CAP
120 mg | ORAL_CAPSULE | Freq: Every day | ORAL | 3 refills | Status: DC
Start: 2016-05-12 — End: 2018-06-11

## 2016-05-12 NOTE — Progress Notes (Signed)
HISTORY OF PRESENT ILLNESS  Jordan Weiss is a 45 y.o. male.  Chief Complaint   Patient presents with   ??? Establish Care     wants to come here for PCP care       HPI  Here for getting established  Living here for 2 y now    Has Renal Ca in left kidney  Scheduled for surgery in February  But wants to get into [facility] and have surgery sooner  Saw Urologist Dr Jimmye Norman in [place]    Has not seen Oncologist, needs a referral  Has not had Bx done  Had MRI and CT    Also has chronic back pain d/t DDD  Has seen Dr Renato Battles, getting Tylenol #3 bid, but not helping  Has seen  Back specialist, but needs to see someone here  And need referral to pain management  Taking BC arthritis  Lidocaine patches and Tylenol    CAD  Not on Chol meds    HTN  Ran out of med Atenolol 3 d ago  Needs refills of all meds      Review of Systems   Constitutional: Negative for weight loss.   HENT: Negative for congestion.    Respiratory: Negative for cough.    Cardiovascular: Negative for chest pain and palpitations.   Gastrointestinal: Positive for abdominal pain. Negative for blood in stool, constipation, diarrhea, melena, nausea and vomiting.   Genitourinary: Negative for dysuria and hematuria.   Musculoskeletal: Positive for back pain.   Neurological: Negative for dizziness, sensory change, weakness and headaches.   Psychiatric/Behavioral: Negative for depression. The patient is not nervous/anxious.      Past Medical History:   Diagnosis Date   ??? CAD (coronary artery disease)    ??? Cancer ([place])     kidney   ??? DDD (degenerative disc disease), lumbar    ??? Hypertension    ??? OA (osteoarthritis) of knee    ??? Renal lesion      Past Surgical History:   Procedure Laterality Date   ??? HX GASTRIC BYPASS  2005   ??? HX HERNIA REPAIR  2015   ??? HX ORTHOPAEDIC  2013    fractured back   ??? HX OTHER SURGICAL Bilateral 2012, 2014    Rotator cuff   ??? HX OTHER SURGICAL Left 1989    degloved injury       Current Outpatient Prescriptions    Medication Sig Dispense Refill   ??? atenolol (TENORMIN) 25 mg tablet Take 1 Tab by mouth daily. 30 Tab 3   ??? tamsulosin (FLOMAX) 0.4 mg capsule Take 1 Cap by mouth daily. 30 Cap 3   ??? nitroglycerin (NITROSTAT) 0.4 mg SL tablet 1 Tab by SubLINGual route every five (5) minutes as needed for Chest Pain. 1 Bottle prn   ??? enalapril (VASOTEC) 10 mg tablet Take 1 Tab by mouth daily. 30 Tab 3   ??? hydroCHLOROthiazide (HYDRODIURIL) 25 mg tablet Take 1 Tab by mouth daily. 30 Tab 3   ??? dilTIAZem CD (CARDIZEM CD) 120 mg ER capsule Take 1 Cap by mouth daily. 30 Cap 3     Allergies   Allergen Reactions   ??? Heparin Analogues Shortness of Breath and Angioedema     Visit Vitals   ??? BP 153/87 (BP 1 Location: Right arm, BP Patient Position: Sitting)   ??? Pulse 64   ??? Temp 98.5 ??F (36.9 ??C) (Temporal)   ??? Resp 16   ??? Ht 6\' 3"  (1.905  m)   ??? Wt 314 lb 3.2 oz (142.5 kg)   ??? SpO2 98%   ??? BMI 39.27 kg/m2     Physical Exam   Constitutional: He is oriented to person, place, and time. He appears well-developed and well-nourished. No distress.   HENT:   Head: Normocephalic and atraumatic.   Mouth/Throat: Oropharynx is clear and moist. No oropharyngeal exudate.   Eyes: Conjunctivae and EOM are normal. Pupils are equal, round, and reactive to light.   Cardiovascular: Normal rate, regular rhythm and normal heart sounds.    Pulmonary/Chest: Effort normal and breath sounds normal.   Musculoskeletal: He exhibits no edema or tenderness.   Lymphadenopathy:     He has no cervical adenopathy.   Neurological: He is alert and oriented to person, place, and time. He displays normal reflexes. He exhibits normal muscle tone.   Skin: Skin is warm and dry.   Scars on RUQ, mid abdomen   Psychiatric: He has a normal mood and affect.   Nursing note and vitals reviewed.    Recent Results (from the past 12 hour(s))   AMB POC URINALYSIS DIP STICK MANUAL W/O MICRO    Collection Time: 05/12/16  3:00 PM   Result Value Ref Range    Color (UA POC) Amber      Clarity (UA POC) Cloudy     Glucose (UA POC) Negative Negative    Bilirubin (UA POC) Negative Negative    Ketones (UA POC) Negative Negative    Specific gravity (UA POC) 1.025 1.001 - 1.035    Blood (UA POC) Negative Negative    pH (UA POC) 5.5 4.6 - 8.0    Protein (UA POC) Negative Negative    Urobilinogen (UA POC) 0.2 mg/dL 0.2 - 1    Nitrites (UA POC) Negative Negative    Leukocyte esterase (UA POC) Negative Negative       ASSESSMENT and PLAN    ICD-10-CM ICD-9-CM    1. Malignant neoplasm of left kidney (HCC) C64.2 189.0 REFERRAL TO UROLOGY      REFERRAL TO ONCOLOGY   2. DDD (degenerative disc disease), lumbar M51.36 722.52 REFERRAL TO ORTHOPEDICS      REFERRAL TO PAIN MANAGEMENT   3. Essential hypertension I10 401.9 atenolol (TENORMIN) 25 mg tablet      CBC WITH AUTOMATED DIFF      METABOLIC PANEL, COMPREHENSIVE      LIPID PANEL WITH LDL/HDL RATIO      AMB POC URINALYSIS DIP STICK MANUAL W/O MICRO      enalapril (VASOTEC) 10 mg tablet      hydroCHLOROthiazide (HYDRODIURIL) 25 mg tablet   4. Coronary artery disease involving native heart without angina pectoris, unspecified vessel or lesion type I25.10 414.01 nitroglycerin (NITROSTAT) 0.4 mg SL tablet   recheck BP in 1 mo

## 2016-05-13 LAB — CBC WITH AUTOMATED DIFF
ABS. BASOPHILS: 0 10*3/uL (ref 0.0–0.2)
ABS. EOSINOPHILS: 0.1 10*3/uL (ref 0.0–0.4)
ABS. IMM. GRANS.: 0 10*3/uL (ref 0.0–0.1)
ABS. MONOCYTES: 0.4 10*3/uL (ref 0.1–0.9)
ABS. NEUTROPHILS: 4.6 10*3/uL (ref 1.4–7.0)
Abs Lymphocytes: 1.6 10*3/uL (ref 0.7–3.1)
BASOPHILS: 0 %
EOSINOPHILS: 1 %
HCT: 41.1 % (ref 37.5–51.0)
HGB: 13.8 g/dL (ref 12.6–17.7)
IMMATURE GRANULOCYTES: 0 %
Lymphocytes: 24 %
MCH: 28.9 pg (ref 26.6–33.0)
MCHC: 33.6 g/dL (ref 31.5–35.7)
MCV: 86 fL (ref 79–97)
MONOCYTES: 6 %
NEUTROPHILS: 69 %
PLATELET: 186 10*3/uL (ref 150–379)
RBC: 4.78 x10E6/uL (ref 4.14–5.80)
RDW: 14.6 % (ref 12.3–15.4)
WBC: 6.7 10*3/uL (ref 3.4–10.8)

## 2016-05-13 LAB — METABOLIC PANEL, COMPREHENSIVE
A-G Ratio: 1.5 (ref 1.2–2.2)
ALT (SGPT): 22 IU/L (ref 0–44)
AST (SGOT): 23 IU/L (ref 0–40)
Albumin: 4.6 g/dL (ref 3.5–5.5)
Alk. phosphatase: 126 IU/L — ABNORMAL HIGH (ref 39–117)
BUN/Creatinine ratio: 25 — ABNORMAL HIGH (ref 9–20)
BUN: 17 mg/dL (ref 6–24)
Bilirubin, total: 0.5 mg/dL (ref 0.0–1.2)
CO2: 23 mmol/L (ref 18–29)
Calcium: 9.2 mg/dL (ref 8.7–10.2)
Chloride: 99 mmol/L (ref 96–106)
Creatinine: 0.67 mg/dL — ABNORMAL LOW (ref 0.76–1.27)
GFR est AA: 132 mL/min/{1.73_m2} (ref >59)
GFR est non-AA: 114 mL/min/{1.73_m2} (ref >59)
GLOBULIN, TOTAL: 3 g/dL (ref 1.5–4.5)
Glucose: 106 mg/dL — ABNORMAL HIGH (ref 65–99)
Potassium: 4.2 mmol/L (ref 3.5–5.2)
Protein, total: 7.6 g/dL (ref 6.0–8.5)
Sodium: 142 mmol/L (ref 134–144)

## 2016-05-13 LAB — LIPID PANEL WITH LDL/HDL RATIO
Cholesterol, total: 214 mg/dL — ABNORMAL HIGH (ref 100–199)
HDL Cholesterol: 64 mg/dL (ref >39)
LDL, calculated: 104 mg/dL — ABNORMAL HIGH (ref 0–99)
LDL/HDL Ratio: 1.6 ratio units (ref 0.0–3.6)
Triglyceride: 232 mg/dL — ABNORMAL HIGH (ref 0–149)
VLDL, calculated: 46 mg/dL — ABNORMAL HIGH (ref 5–40)

## 2016-05-13 LAB — CVD REPORT

## 2016-05-14 NOTE — Progress Notes (Signed)
Call pt, the CBC is normal  The kidney and liver tests are ok  The Chol and sugar are a little up  Avoid sweets and fatty foods and recheck in 6 mo FASTING

## 2016-05-14 NOTE — Progress Notes (Signed)
Patient notified via letter

## 2016-05-26 NOTE — Progress Notes (Signed)
Spoke to Oncologist ar [facility]  He will be there only until March 15  Pt will have surgery in Februar for left Renal Ca  Oncologist does not think he has much to offer in Jan  I spoke to pt   But pt has appt and wants to see Oncologist even before the surgery

## 2016-05-28 ENCOUNTER — Encounter: Attending: Specialist | Primary: Physician Assistant

## 2016-05-28 ENCOUNTER — Encounter: Payer: MEDICARE | Primary: Physician Assistant

## 2016-06-19 ENCOUNTER — Encounter: Attending: Family Medicine | Primary: Physician Assistant

## 2016-06-30 ENCOUNTER — Encounter: Payer: MEDICARE | Primary: Physician Assistant

## 2016-06-30 ENCOUNTER — Encounter: Attending: Specialist | Primary: Physician Assistant

## 2016-09-16 ENCOUNTER — Inpatient Hospital Stay
Admit: 2016-09-16 | Discharge: 2016-09-17 | Disposition: A | Discharge status: Home / Self Care | Payer: Self-pay | Attending: Emergency Medicine

## 2016-09-16 ENCOUNTER — Emergency Department: Admit: 2016-09-16 | Payer: Self-pay | Primary: Physician Assistant

## 2016-09-16 DIAGNOSIS — M5442 Lumbago with sciatica, left side: Secondary | ICD-10-CM

## 2016-09-16 MED ORDER — OXYCODONE-ACETAMINOPHEN 5 MG-325 MG TAB
5-325 mg | ORAL | Status: AC
Start: 2016-09-16 — End: 2016-09-16
  Administered 2016-09-16: 22:00:00 via ORAL

## 2016-09-16 MED ORDER — CYCLOBENZAPRINE 5 MG TAB
5 mg | ORAL_TABLET | Freq: Three times a day (TID) | ORAL | 0 refills | Status: DC | PRN
Start: 2016-09-16 — End: 2016-09-25

## 2016-09-16 MED ORDER — ONDANSETRON 4 MG TAB, RAPID DISSOLVE
4 mg | ORAL | Status: AC
Start: 2016-09-16 — End: 2016-09-16
  Administered 2016-09-16: 22:00:00 via ORAL

## 2016-09-16 MED ORDER — HYDROCODONE-ACETAMINOPHEN 5 MG-325 MG TAB
5-325 mg | ORAL_TABLET | ORAL | 0 refills | Status: DC | PRN
Start: 2016-09-16 — End: 2016-09-25

## 2016-09-16 MED FILL — OXYCODONE-ACETAMINOPHEN 5 MG-325 MG TAB: 5-325 mg | ORAL | Qty: 1

## 2016-09-16 MED FILL — ONDANSETRON 4 MG TAB, RAPID DISSOLVE: 4 mg | ORAL | Qty: 1

## 2016-09-16 NOTE — ED Triage Notes (Signed)
Pt states approx 15ft up on ladder yest. Pt states ladder broke, pt landed on his feet, twisted but never fell, pt states unable to sleep last night, taking tylenol and motrin with no relief, pt states painful walk, stand or lay flat.

## 2016-09-16 NOTE — ED Provider Notes (Signed)
[facility]  EMERGENCY DEPARTMENT HISTORY AND PHYSICAL EXAM    Date: 09/16/2016  Patient Name: Jordan Weiss  Date of Birth: Feb 24, 1971  Medical Record Number: [id number]     History of Presenting Illness     Chief Complaint   Patient presents with   ??? Back Pain       History Provided By: Patient    Chief Complaint: Back pain  Duration: 1 Days  Timing:  Constant  Location: Left lower back  Quality: Pressure  Severity: 10 out of 10  Modifying Factors: Pain is worse with laying flat, standing, or walking  Associated Symptoms: Left hip pain    Additional History (Context):   5:05 PM   Jordan Weiss is a 46 y.o. male with PMHX HTN, who presents to the emergency department C/O pressured left lower back pain s/p falling 4 ft down from ladder yesterday. Pain is worse with laying flat, standing, or walking. States he had landed on his feet and twisted, but did not fall all the way to the ground. Associated symptoms include left hip pain. Reports no relief with Tylenol or Motrin. Pt denies numbness, tingling, weakness, bladder/bowel incontinence, hx back surgeries, knee pain, and any other Sx or complaints.      PCP: [name], MD    Current Outpatient Prescriptions   Medication Sig Dispense Refill   ??? HYDROcodone-acetaminophen (NORCO) 5-325 mg per tablet Take 1 Tab by mouth every four (4) hours as needed for Pain. Max Daily Amount: 6 Tabs. 10 Tab 0   ??? cyclobenzaprine (FLEXERIL) 5 mg tablet Take 1 Tab by mouth three (3) times daily as needed for Muscle Spasm(s). 10 Tab 0   ??? atenolol (TENORMIN) 25 mg tablet Take 1 Tab by mouth daily. 30 Tab 3   ??? tamsulosin (FLOMAX) 0.4 mg capsule Take 1 Cap by mouth daily. 30 Cap 3   ??? nitroglycerin (NITROSTAT) 0.4 mg SL tablet 1 Tab by SubLINGual route every five (5) minutes as needed for Chest Pain. 1 Bottle prn   ??? enalapril (VASOTEC) 10 mg tablet Take 1 Tab by mouth daily. 30 Tab 3    ??? hydroCHLOROthiazide (HYDRODIURIL) 25 mg tablet Take 1 Tab by mouth daily. 30 Tab 3   ??? dilTIAZem CD (CARDIZEM CD) 120 mg ER capsule Take 1 Cap by mouth daily. 30 Cap 3       Past History     Past Medical History:  Past Medical History:   Diagnosis Date   ??? CAD (coronary artery disease)    ??? Cancer ([place])     kidney   ??? DDD (degenerative disc disease), lumbar    ??? Hypertension    ??? OA (osteoarthritis) of knee    ??? Renal lesion        Past Surgical History:  Past Surgical History:   Procedure Laterality Date   ??? HX GASTRIC BYPASS  2005   ??? HX HERNIA REPAIR  2015   ??? HX ORTHOPAEDIC  2013    fractured back   ??? HX OTHER SURGICAL Bilateral 2012, 2014    Rotator cuff   ??? HX OTHER SURGICAL Left 1989    degloved injury       Family History:  Family History   Problem Relation Age of Onset   ??? Diabetes Mother    ??? Cancer Mother      breast   ??? Cancer Father      lung   ??? Diabetes Father    ???  Diabetes Sister    ??? Hypertension Sister    ??? Osteoporosis Sister    ??? Cancer Sister      cervical cancer   ??? Heart Failure Sister    ??? Hypertension Sister    ??? Kidney Disease Brother      had transplant   ??? Hypertension Brother    ??? Diabetes Brother    ??? Hypertension Sister    ??? Diabetes Brother    ??? Stroke Brother    ??? Hypertension Brother    ??? Heart Attack Brother    ??? Heart Attack Brother    ??? Hypertension Brother        Social History:  Social History   Substance Use Topics   ??? Smoking status: Former Smoker   ??? Smokeless tobacco: Never Used   ??? Alcohol use 0.0 oz/week     0 Standard drinks or equivalent per week      Comment: rare       Allergies:  Allergies   Allergen Reactions   ??? Heparin Analogues Shortness of Breath and Angioedema         Review of Systems     Review of Systems   Gastrointestinal:        (-) bowel incontinence   Genitourinary:        (-) bladder incontinence   Musculoskeletal: Positive for arthralgias (left hip) and back pain.   Skin: Negative for color change and wound.    Neurological: Negative for weakness and numbness.   All other systems reviewed and are negative.      Physical Exam     Vitals:    09/16/16 1620 09/16/16 1625   BP: 180/82    Pulse: 66    Resp: 16    Temp:  97.9 ??F (36.6 ??C)   SpO2: 100%    Weight: 127 kg (280 lb)    Height: 6\' 3"  (1.905 m)      Physical Exam   Constitutional: He is oriented to person, place, and time. He appears well-developed and well-nourished.   Appears uncomfortable, alert, oriented x4, no neuro deficits, able to ambulate in ED, non toxic   HENT:   Head: Normocephalic and atraumatic.   Neck: Normal range of motion. Neck supple.   Cardiovascular: Normal rate, regular rhythm, normal heart sounds and intact distal pulses.    Pulmonary/Chest: Effort normal and breath sounds normal. No respiratory distress. He has no wheezes. He has no rales.   Abdominal: Soft. Bowel sounds are normal. He exhibits no distension. There is no tenderness. There is no rebound and no guarding.   Musculoskeletal:        Left hip: He exhibits decreased range of motion (secondary to pain ) and tenderness. He exhibits no swelling, no crepitus and no deformity.        Back:    Left lower back TTP, decreased ROM secondary to pain, no crepitus or step off, no bruising swelling or skin changes  BLE: strength 5/5, pulses 2+ for DP and PT, brisk cap refill, sensation intact, increased pain with flexion and external rotation and abduction of left hip but legs non tender  Negative straight leg raise bilaterally    Neurological: He is alert and oriented to person, place, and time. He has normal strength. No sensory deficit.   Skin: Skin is warm and dry. No rash noted.   Nursing note and vitals reviewed.        Diagnostic Study Results  Labs -   No results found for this or any previous visit (from the past 12 hour(s)).    Radiologic Studies -  XR HIP LT W OR WO PELV 2-3 VWS   Final Result     IMPRESSION:  ??  No acute fracture or subluxation    As read by the radiologist.      XR SPINE LUMB MIN 4 V   Final Result     FINDINGS:  ??  Paraspinal soft tissues are normal. SI joints are intact. Evaluation the sacrum  is limited. There are no acute fractures. Minimal grade 1 anterolisthesis at  L5-S1 unchanged secondary to degenerative disc disease. Old superior endplate  compression deformity of T12 seen unchanged.    As read by the radiologist.       CT Results  (Last 48 hours)    None        CXR Results  (Last 48 hours)    None          Medications Given in the ED:  Medications   oxyCODONE-acetaminophen (PERCOCET) 5-325 mg per tablet 1 Tab (1 Tab Oral Given 09/16/16 1734)   ondansetron (ZOFRAN ODT) tablet 4 mg (4 mg Oral Given 09/16/16 1734)        Medical Decision Making     I am the first provider for this patient.    I reviewed the vital signs, available nursing notes, past medical history, past surgical history, family history and social history.    Records Reviewed: Nursing Notes    Vital Signs-Reviewed the patient's vital signs.  Patient Vitals for the past 12 hrs:   Temp Pulse Resp BP SpO2   09/16/16 1625 97.9 ??F (36.6 ??C) - - - -   09/16/16 1620 - 66 16 180/82 100 %       Provider Notes (Medical Decision Making):   Fx, subluxation, Could be disc involvement / herniation.   Doubt fracture, cauda equina syndrome, AAA, metastases, pathological fracture, Zoster or other serious pathology.  No neurological deficits.        Pulse Oximetry Analysis - Normal 100% on RA        Procedures:  Procedures     ED Course:   5:05 PM Initial assessment performed. Pt and/or pt's family are aware of the plan of care and are in agreement.    Diagnosis and Disposition       Discharge Note:  7:09 PM  Fount Bloch's  results have been reviewed with him.  He has been counseled regarding his diagnosis, treatment, and plan.  He verbally conveys understanding and agreement of the signs, symptoms, diagnosis, treatment and prognosis and additionally agrees to follow up as discussed.   He also agrees with the care-plan and conveys that all of his questions have been answered.  I have also provided discharge instructions for him that include: educational information regarding their diagnosis and treatment, and list of reasons why they would want to return to the ED prior to their follow-up appointment, should his condition change. He has been provided with education for proper emergency department utilization.     Clinical Impression:    1. Acute left-sided low back pain with left-sided sciatica        PLAN:  1. D/C Home  2.   Discharge Medication List as of 09/16/2016  7:09 PM      START taking these medications    Details   HYDROcodone-acetaminophen (NORCO) 5-325 mg per tablet Take 1 Tab  by mouth every four (4) hours as needed for Pain. Max Daily Amount: 6 Tabs., Print, Disp-10 Tab, R-0      cyclobenzaprine (FLEXERIL) 5 mg tablet Take 1 Tab by mouth three (3) times daily as needed for Muscle Spasm(s)., Print, Disp-10 Tab, R-0         CONTINUE these medications which have NOT CHANGED    Details   atenolol (TENORMIN) 25 mg tablet Take 1 Tab by mouth daily., Normal, Disp-30 Tab, R-3      tamsulosin (FLOMAX) 0.4 mg capsule Take 1 Cap by mouth daily., Normal, Disp-30 Cap, R-3      nitroglycerin (NITROSTAT) 0.4 mg SL tablet 1 Tab by SubLINGual route every five (5) minutes as needed for Chest Pain., Normal, Disp-1 Bottle, R-prn      enalapril (VASOTEC) 10 mg tablet Take 1 Tab by mouth daily., Normal, Disp-30 Tab, R-3      hydroCHLOROthiazide (HYDRODIURIL) 25 mg tablet Take 1 Tab by mouth daily., Normal, Disp-30 Tab, R-3      dilTIAZem CD (CARDIZEM CD) 120 mg ER capsule Take 1 Cap by mouth daily., Normal, Disp-30 Cap, R-3           3.   Follow-up Information     Follow up With Details Comments Peters, MD Schedule an appointment as soon as possible for a visit in 2 days For PCP follow up [place] [address]  [phone]       [name], MD Schedule an appointment as soon as possible for a visit in 2 days For orthopedic follow up [address] and [place] [address]  [phone]      [facility] EMERGENCY DEPT  As needed, If symptoms worsen [address]  [place] [address]  [phone]        _______________________________    Attestations:  This note is prepared by Levin Bacon, acting as Scribe for [employer], PA-C.    Haynes Kerns, PA-C:  The scribe's documentation has been prepared under my direction and personally reviewed by me in its entirety.  I confirm that the note above accurately reflects all work, treatment, procedures, and medical decision making performed by me.    This note is prepared by Leighton Ruff. Tollis, acting as Education administrator for [employer], [employer] .    Haynes Kerns, PA-C: The scribe's documentation has been prepared under my direction and personally reviewed by me in its entirety. I confirm that the note above accurately reflects all work, treatment, procedures, and medical decision making performed by me.   _______________________________

## 2016-09-25 ENCOUNTER — Emergency Department

## 2016-09-25 ENCOUNTER — Emergency Department: Admit: 2016-09-26 | Payer: Self-pay | Primary: Physician Assistant

## 2016-09-25 DIAGNOSIS — S7002XA Contusion of left hip, initial encounter: Secondary | ICD-10-CM

## 2016-09-25 NOTE — ED Provider Notes (Signed)
[facility]  Emergency Department Treatment Report    Patient: Jordan Weiss Age: 46 y.o. Sex: male    Date of Birth: 1970-07-11 Admit Date: 09/25/2016 PCP: [name], MD   MRN: [id number]  CSN: [id number]  Attending: Minna Merritts, MD   Room: UV25/DG64 Time Dictated: 9:57 PM APP: Enrique Sack, PA-C       Chief Complaint   Chief Complaint   Patient presents with   ??? Hip Pain     fell this morning with injury to left hip. pain has become worse. difficulty bearing weight and moving left  leg.        History of Present Illness   46 y.o. male with history of osteoarthritis of knee presenting to the ED for evaluation of "throbbing" left hip pain rated as a "50/10" in severity that began this morning after tripping and falling on concrete. Denies hitting head, loss of consciousness, or numbness and tingling. He states that he feels as though his hip is broken and that the pain radiates to his legs and toes and into his lower back. The pain is exacerbated with ambulating and moving leg. Patient states that he took tylenol and ibuprofen at 6:30pm with no alleviation of pain.    Review of Systems   Review of Systems   Constitutional: Negative for chills and fever.   HENT: Negative for nosebleeds.    Gastrointestinal: Negative for abdominal pain.   Musculoskeletal: Positive for back pain (lower radiating from left hip).        Positive for left hip pain.   Skin: Negative for rash.   Neurological: Negative for tingling, sensory change, loss of consciousness and weakness.        Negative for numbness.       Past Medical/Surgical History     Past Medical History:   Diagnosis Date   ??? CAD (coronary artery disease)    ??? Cancer ([place])     kidney   ??? DDD (degenerative disc disease), lumbar    ??? Diabetes ([place])    ??? Hypertension    ??? OA (osteoarthritis) of knee    ??? Renal lesion      Past Surgical History:   Procedure Laterality Date   ??? HX GASTRIC BYPASS  2005   ??? HX HERNIA REPAIR  2015    ??? HX ORTHOPAEDIC  2013    fractured back   ??? HX OTHER SURGICAL Bilateral 2012, 2014    Rotator cuff   ??? HX OTHER SURGICAL Left 1989    degloved injury   ??? HX SHOULDER ARTHROSCOPY      rotator cuff repair x 2       Social History     Social History     Social History   ??? Marital status: MARRIED     Spouse name: N/A   ??? Number of children: N/A   ??? Years of education: N/A     Social History Main Topics   ??? Smoking status: Former Smoker   ??? Smokeless tobacco: Never Used   ??? Alcohol use 0.0 oz/week     0 Standard drinks or equivalent per week      Comment: rare   ??? Drug use: No   ??? Sexual activity: Yes     Partners: Female     Other Topics Concern   ??? None     Social History Narrative       Family History     Family  History   Problem Relation Age of Onset   ??? Diabetes Mother    ??? Cancer Mother      breast   ??? Heart Disease Mother    ??? Cancer Father      lung   ??? Diabetes Father    ??? Stroke Father    ??? Diabetes Sister    ??? Hypertension Sister    ??? Osteoporosis Sister    ??? Cancer Sister      cervical cancer   ??? Heart Failure Sister    ??? Hypertension Sister    ??? Kidney Disease Brother      had transplant   ??? Hypertension Brother    ??? Diabetes Brother    ??? Hypertension Sister    ??? Diabetes Brother    ??? Stroke Brother    ??? Hypertension Brother    ??? Heart Attack Brother    ??? Heart Attack Brother    ??? Hypertension Brother        Current Medications     Prior to Admission Medications   Prescriptions Last Dose Informant Patient Reported? Taking?   HYDROcodone-acetaminophen (NORCO) 5-325 mg per tablet   No No   Sig: Take 1 Tab by mouth every four (4) hours as needed for Pain. Max Daily Amount: 6 Tabs.   atenolol (TENORMIN) 25 mg tablet   No No   Sig: Take 1 Tab by mouth daily.   cyclobenzaprine (FLEXERIL) 5 mg tablet   No No   Sig: Take 1 Tab by mouth three (3) times daily as needed for Muscle Spasm(s).   dilTIAZem CD (CARDIZEM CD) 120 mg ER capsule   No No   Sig: Take 1 Cap by mouth daily.    enalapril (VASOTEC) 10 mg tablet   No No   Sig: Take 1 Tab by mouth daily.   hydroCHLOROthiazide (HYDRODIURIL) 25 mg tablet   No No   Sig: Take 1 Tab by mouth daily.   nitroglycerin (NITROSTAT) 0.4 mg SL tablet   No No   Sig: 1 Tab by SubLINGual route every five (5) minutes as needed for Chest Pain.   tamsulosin (FLOMAX) 0.4 mg capsule   No No   Sig: Take 1 Cap by mouth daily.      Facility-Administered Medications: None       Allergies     Allergies   Allergen Reactions   ??? Heparin Analogues Shortness of Breath and Angioedema       Physical Exam   ED Triage Vitals   Enc Vitals Group      BP 09/25/16 2150 163/81      Pulse (Heart Rate) 09/25/16 2150 82      Resp Rate 09/25/16 2150 20      Temp 09/25/16 2150 98.3 ??F (36.8 ??C)      Temp src --       O2 Sat (%) 09/25/16 2150 100 %      Weight 09/25/16 2147 275 lb      Height 09/25/16 2147 6\' 1"      Physical Exam   Constitutional: He is oriented to person, place, and time.   Well developed and well nourished. Appearance and behavior are appropriate for age and situation.   HENT:   Head: Normocephalic.   Right Ear: External ear normal.   Left Ear: External ear normal.   Mouth/Throat: Uvula is midline, oropharynx is clear and moist and mucous membranes are normal. Mucous membranes are not pale, not dry and not  cyanotic. No oral lesions. No trismus in the jaw. No uvula swelling. No oropharyngeal exudate, posterior oropharyngeal edema, posterior oropharyngeal erythema or tonsillar abscesses.   Eyes: Conjunctivae are normal. Right eye exhibits no discharge. Left eye exhibits no discharge. No scleral icterus.   Neck: Normal range of motion. Neck supple.   Cardiovascular: Normal rate, regular rhythm and normal heart sounds.  Exam reveals no gallop and no friction rub.    No murmur heard.  Pulses:       Radial pulses are 2+ on the right side, and 2+ on the left side.        Dorsalis pedis pulses are 2+ on the right side, and 2+ on the left side.    Calves are soft and nontender.  No peripheral edema or significant varicosities.   Pulmonary/Chest: Effort normal and breath sounds normal. No accessory muscle usage. No tachypnea. No respiratory distress. He has no decreased breath sounds. He has no wheezes. He has no rhonchi. He has no rales.   Abdominal: Soft. He exhibits no distension and no mass. There is no hepatosplenomegaly. There is no tenderness. There is no rigidity, no rebound and no guarding.   Musculoskeletal: He exhibits no deformity.   Diffuse tenderness to left hip.  No tenderness to remainder of left lower extremities.   Moving all extremities normally.     Neurological: He is alert and oriented to person, place, and time. He displays facial symmetry and normal speech. GCS score is 15.   Sensation intact to lower extremities with decreased sensation in the left lower extremity.  Answering questions appropriately.    Skin: Skin is warm and dry. No rash noted. He is not diaphoretic.   Vitals reviewed.      Impression and Management Plan   This is a 46 y.o. male presenting to the ED for evaluation of left hip pain following fall this morning.  He is neurovascularly intact.  Vital signs stable.  X-ray we obtained to assess for fracture.  He'll be given symptomatic medication.      Diagnostic Studies   Lab:   No results found for this or any previous visit (from the past 12 hour(s)).    Imaging:    Xr Hip Lt W Or Wo Pelv 2-3 Vws    Result Date: 09/25/2016  INDICATION: fall, diffuse pain    EXAMINATION: XR HIP LT W OR WO PELV 2-3 VWS COMPARISON: None FINDINGS: There is no fracture or dislocation. There is mild degeneration of both hips.     IMPRESSION: 1. Negative for fracture. 2. Mild osteoarthritis of both hips.       CT Left Lower Extremity Without Intravenous Contrast, Hip read by [facility]    FINDINGS:  Bones/joints: Degenerative changes of the lower lumbar spine. Left L5 pars interarticularis defect.   Left hip joint effusion. No acute fracture. No dislocation.  Soft tissues: Unremarkable.    IMPRESSION: No CT evidence of acute fracture or dislocation.      Medical Decision Making/ED Course   Patient remained stable during his stay, he did not develop new or worsening symptoms.  He stated that he was in severe pain and thinks that it is broken.  He will not allow [place] to do range of motion of his hip even though the hip x-ray is negative for fracture.  CT of the left lower extremity will be obtained.  CT is negative for acute fracture dislocation.  He does have a left hip joint effusion.  I  do not believe this is septic joint.  He is afebrile and there is no joint swelling, warmth or erythema. Prescription for Robaxin given.  Work note provided.  Advised not to drive or work when taking Robaxin.  Results of radiology tests were discussed with the patient. Copy of results were given. All questions were answered and concerns addressed.  Advised follow-up with orthopedics as soon as an appointment is available for reevaluation.  Crutches given.  Work note provided.  Advised to return to the ED for new or worsening symptoms or concerns.  Patient verbalized understanding of this plan.      Procedures    Medications   HYDROcodone-acetaminophen (NORCO) 5-325 mg per tablet 1 Tab (1 Tab Oral Given 09/25/16 2229)   ketorolac (TORADOL) injection 15 mg (15 mg IntraMUSCular Given 09/25/16 2322)        Final Diagnosis       ICD-10-CM ICD-9-CM   1. Fall, initial encounter W19.XXXA E888.9   2. Contusion of left hip, initial encounter S70.02XA 924.01       Disposition     Patient was discharged home in stable condition with discharge instructions as stated above.     Current Discharge Medication List      START taking these medications    Details   methocarbamol (ROBAXIN) 500 mg tablet Take 2 Tabs by mouth four (4) times daily.  Qty: 24 Tab, Refills: 0             Scribe Attestation      [employer] and Maggie P Verlene Mayer acting as a Education administrator for and in the presence of Gwenith Daily, PA-C      September 25, 2016 at 9:57 PM       Provider Attestation:      I personally performed the services described in the documentation, reviewed the documentation, as recorded by the scribe in my presence, and it accurately and completely records my words and actions. September 25, 2016 at 9:57 PM - Gwenith Daily, PA-C      The patient was personally evaluated by myself and EMILY A Sandi Mealy, MD who agrees with the above assessment and plan.    Dragon medical dictation software was used for portions of this report. Unintended transcription errors may occur.     Gwenith Daily, PA-C  September 26, 2016      My signature above authenticates this document and my orders, the final ??  diagnosis (es), discharge prescription (s), and instructions in the Epic record.  If you have any questions please contact [phone].  ??  Nursing notes have been reviewed by the physician/ advanced practice Clinician.

## 2016-09-25 NOTE — ED Notes (Signed)
Patient fell over a chair at 0430 in the morning and fell on his left hip.  He is non-weight bearing on the hip.

## 2016-09-26 ENCOUNTER — Inpatient Hospital Stay
Admit: 2016-09-26 | Discharge: 2016-09-26 | Disposition: A | Discharge status: Home / Self Care | Payer: Self-pay | Attending: Emergency Medicine

## 2016-09-26 MED ORDER — METHOCARBAMOL 500 MG TAB
500 mg | ORAL_TABLET | Freq: Four times a day (QID) | ORAL | 0 refills | Status: DC
Start: 2016-09-26 — End: 2017-02-13

## 2016-09-26 MED ORDER — HYDROCODONE-ACETAMINOPHEN 5 MG-325 MG TAB
5-325 mg | ORAL | Status: AC
Start: 2016-09-26 — End: 2016-09-25
  Administered 2016-09-26: 02:00:00 via ORAL

## 2016-09-26 MED ORDER — KETOROLAC TROMETHAMINE 30 MG/ML INJECTION
30 mg/mL (1 mL) | INTRAMUSCULAR | Status: AC
Start: 2016-09-26 — End: 2016-09-25
  Administered 2016-09-26: 03:00:00 via INTRAMUSCULAR

## 2016-09-26 MED ORDER — METHOCARBAMOL 500 MG TAB
500 mg | ORAL_TABLET | Freq: Four times a day (QID) | ORAL | 0 refills | Status: DC
Start: 2016-09-26 — End: 2016-09-25

## 2016-09-26 MED FILL — KETOROLAC TROMETHAMINE 30 MG/ML INJECTION: 30 mg/mL (1 mL) | INTRAMUSCULAR | Qty: 1

## 2016-09-26 MED FILL — HYDROCODONE-ACETAMINOPHEN 5 MG-325 MG TAB: 5-325 mg | ORAL | Qty: 1

## 2016-09-26 NOTE — ED Notes (Signed)
Discharge instructions given to patient (name) with verbalization of understanding.  Patient accompanied by family.  Patient discharged with the following medications: Robaxin.  Patient discharged home (destination).     Edyth Gunnels RN

## 2016-12-09 ENCOUNTER — Emergency Department: Admit: 2016-12-10 | Payer: Self-pay | Primary: Physician Assistant

## 2016-12-09 DIAGNOSIS — T189XXA Foreign body of alimentary tract, part unspecified, initial encounter: Secondary | ICD-10-CM

## 2016-12-09 NOTE — ED Provider Notes (Signed)
EMERGENCY DEPARTMENT HISTORY AND PHYSICAL EXAM    Date: 12/09/2016  Patient Name: Jordan Weiss    History of Presenting Illness     Chief Complaint   Patient presents with   ??? Foreign Body Swallowed         History Provided By: Patient    Chief Complaint: Foreign body swallowed  Duration: Just PTA  Timing:  Acute  Location: Mouth and throat  Quality: Piece of glass  Severity: 8 out of 10   Modifying Factors: No relieving or worsening factors   Associated Symptoms: Cuts inside mouth, sore throat, and jaw pain    Additional History (Context):   10:41 PM  Jordan Weiss is a 46 y.o. male with PMHx of HTN, CAD, DM, and left kidney CA, who presents to ED C/O foreign body swallowed onset just PTA. Pt was eating at [facility], bit into a piece of glass ~size of eraser head inside his food, and had accidentally swallowed some glass, but was able to spit some glass out. Associated sxs include cuts inside mouth, sore throat, and jaw pain. Tetanus is UTD. Pt followed [facility] his kidney [facility]. Denies abdominal pain, color change, and any other sxs or complaints.     PCP: [name], MD    Current Outpatient Prescriptions   Medication Sig Dispense Refill   ??? amoxicillin 500 mg tab Take 500 mg by mouth three (3) times daily. 15 Tab 0   ??? methocarbamol (ROBAXIN) 500 mg tablet Take 2 Tabs by mouth four (4) times daily. 24 Tab 0   ??? atenolol (TENORMIN) 25 mg tablet Take 1 Tab by mouth daily. 30 Tab 3   ??? tamsulosin (FLOMAX) 0.4 mg capsule Take 1 Cap by mouth daily. 30 Cap 3   ??? hydroCHLOROthiazide (HYDRODIURIL) 25 mg tablet Take 1 Tab by mouth daily. 30 Tab 3   ??? dilTIAZem CD (CARDIZEM CD) 120 mg ER capsule Take 1 Cap by mouth daily. 30 Cap 3   ??? nitroglycerin (NITROSTAT) 0.4 mg SL tablet 1 Tab by SubLINGual route every five (5) minutes as needed for Chest Pain. 1 Bottle prn       Past History     Past Medical History:  Past Medical History:   Diagnosis Date   ??? CAD (coronary artery disease)     ??? Cancer ([place])     kidney   ??? DDD (degenerative disc disease), lumbar    ??? Diabetes ([place])    ??? Hypertension    ??? OA (osteoarthritis) of knee    ??? Renal lesion        Past Surgical History:  Past Surgical History:   Procedure Laterality Date   ??? HX GASTRIC BYPASS  2005   ??? HX HERNIA REPAIR  2015   ??? HX ORTHOPAEDIC  2013    fractured back   ??? HX OTHER SURGICAL Bilateral 2012, 2014    Rotator cuff   ??? HX OTHER SURGICAL Left 1989    degloved injury   ??? HX SHOULDER ARTHROSCOPY      rotator cuff repair x 2       Family History:  Family History   Problem Relation Age of Onset   ??? Diabetes Mother    ??? Cancer Mother      breast   ??? Heart Disease Mother    ??? Cancer Father      lung   ??? Diabetes Father    ??? Stroke Father    ???  Diabetes Sister    ??? Hypertension Sister    ??? Osteoporosis Sister    ??? Cancer Sister      cervical cancer   ??? Heart Failure Sister    ??? Hypertension Sister    ??? Kidney Disease Brother      had transplant   ??? Hypertension Brother    ??? Diabetes Brother    ??? Hypertension Sister    ??? Diabetes Brother    ??? Stroke Brother    ??? Hypertension Brother    ??? Heart Attack Brother    ??? Heart Attack Brother    ??? Hypertension Brother        Social History:  Social History   Substance Use Topics   ??? Smoking status: Former Smoker   ??? Smokeless tobacco: Never Used   ??? Alcohol use 0.0 oz/week     0 Standard drinks or equivalent per week      Comment: rare       Allergies:  Allergies   Allergen Reactions   ??? Heparin Analogues Shortness of Breath and Angioedema         Review of Systems   Review of Systems   HENT: Positive for dental problem (jaw pain) and sore throat. Negative for trouble swallowing.    Gastrointestinal: Negative for abdominal pain.   Skin: Positive for wound (cuts inside mouth). Negative for color change.   All other systems reviewed and are negative.      Physical Exam     Vitals:    12/09/16 2127   BP: (!) 179/99   Pulse: (!) 58   Resp: 16   Temp: 97.8 ??F (36.6 ??C)   SpO2: 99%   Weight: 120.2 kg (265 lb)    Height: 6\' 3"  (1.905 m)     Physical Exam   Constitutional: He is oriented to person, place, and time. He appears well-developed and well-nourished.   Obese male   HENT:   Head: Normocephalic and atraumatic.   Mouth/Throat: Oral lesions present.   Small excoriation on the left buccal mucosa    Eyes: Pupils are equal, round, and reactive to light.   Neck: Neck supple.   Cardiovascular: Normal rate, regular rhythm, S1 normal, S2 normal and normal heart sounds.    Pulmonary/Chest: Breath sounds normal. No respiratory distress. He has no wheezes. He has no rales. He exhibits no tenderness.   Abdominal: Soft. He exhibits no distension and no mass. There is no tenderness. There is no guarding.   Musculoskeletal: Normal range of motion. He exhibits no edema or tenderness.   Neurological: He is alert and oriented to person, place, and time. No cranial nerve deficit.   Skin: No rash noted.   Psychiatric: He has a normal mood and affect. His behavior is normal. Thought content normal.   Nursing note and vitals reviewed.        Diagnostic Study Results     Labs -   No results found for this or any previous visit (from the past 12 hour(s)).    Radiologic Studies -     RADIOLOGY FINDINGS  Abdomen with chest X-ray shows no acute process   Pending review by Radiologist  Recorded by Levin Bacon, ED Scribe, as dictated by Smitty Knudsen, MD     XR ABD ACUTE W 1 V CHEST    (Results Pending)       Medications given in the ED-  Medications   GI COCKTAIL [facility] CMPD) (30 mL Oral Given  12/09/16 2303)         Medical Decision Making   I am the first provider for this patient.    I reviewed the vital signs, available nursing notes, past medical history, past surgical history, family history and social history.    Vital Signs-Reviewed the patient's vital signs.    Pulse Oximetry Analysis - 99% on RA     Records Reviewed: Nursing Notes old medical records    Procedures:  Procedures    ED Course:    10:41PM Initial assessment performed. The patients presenting problems have been discussed, and they are in agreement with the care plan formulated and outlined with them.  I have encouraged them to ask questions as they arise throughout their visit.    Diagnosis and Disposition       DISCHARGE NOTE:  11:11 PM  Jordan Weiss's  results have been reviewed with him.  He has been counseled regarding his diagnosis, treatment, and plan.  He verbally conveys understanding and agreement of the signs, symptoms, diagnosis, treatment and prognosis and additionally agrees to follow up as discussed.  He also agrees with the care-plan and conveys that all of his questions have been answered.  I have also provided discharge instructions for him that include: educational information regarding their diagnosis and treatment, and list of reasons why they would want to return to the ED prior to their follow-up appointment, should his condition change. He has been provided with education for proper emergency department utilization.     CLINICAL IMPRESSION:    1. Swallowed foreign body, initial encounter        PLAN:  1. D/C Home  2.   Discharge Medication List as of 12/09/2016 11:37 PM        3.   Follow-up Information     Follow up With Details Comments [place], MD Schedule an appointment as soon as possible for a visit in 2 days For follow up with PCP [address]  [place] [address]  [phone]      [facility] EMERGENCY DEPT Go to As needed, If symptoms worsen [address]  [place] [address]  [phone]        _______________________________    Attestations:  This note is prepared by Charlann Noss, acting as Scribe for Smitty Knudsen, MD.    Smitty Knudsen, MD:  The scribe's documentation has been prepared under my direction and personally reviewed by me in its entirety.  I confirm that the note above accurately reflects all work, treatment,  procedures, and medical decision making performed by me.  _______________________________

## 2016-12-09 NOTE — ED Notes (Signed)
Patient armband removed and shredded  I have reviewed discharge instructions with the patient.  The patient verbalized understanding.

## 2016-12-09 NOTE — ED Triage Notes (Signed)
Pt states " My son took me out to eat at [employer] and there was glass in the food and I cut my mouth and swallowed some."

## 2016-12-09 NOTE — ED Notes (Signed)
Patient refused to get into hospital gown at this time. States, "why can't I just go in this". Informed patient that x-ray staff may ask to be placed in gown

## 2016-12-10 ENCOUNTER — Inpatient Hospital Stay
Admit: 2016-12-10 | Discharge: 2016-12-10 | Disposition: A | Discharge status: Home / Self Care | Payer: Self-pay | Attending: Emergency Medicine

## 2016-12-10 MED ORDER — AMOXICILLIN 500 MG TABLET
500 mg | ORAL_TABLET | Freq: Three times a day (TID) | ORAL | 0 refills | Status: DC
Start: 2016-12-10 — End: 2016-12-21

## 2016-12-10 MED ORDER — GI COCKTAIL (MIH CMPD)
ORAL | Status: AC
Start: 2016-12-10 — End: 2016-12-09
  Administered 2016-12-10: 03:00:00 via ORAL

## 2016-12-10 MED ORDER — GI COCKTAIL (MIH CMPD)
Freq: Four times a day (QID) | ORAL | Status: DC
Start: 2016-12-10 — End: 2016-12-09

## 2016-12-10 MED FILL — GI COCKTAIL (MIH CMPD): ORAL | Qty: 30

## 2016-12-21 ENCOUNTER — Inpatient Hospital Stay
Admit: 2016-12-21 | Discharge: 2016-12-21 | Disposition: A | Discharge status: Home / Self Care | Payer: Self-pay | Attending: Emergency Medicine

## 2016-12-21 DIAGNOSIS — R1084 Generalized abdominal pain: Secondary | ICD-10-CM

## 2016-12-21 LAB — METABOLIC PANEL, COMPREHENSIVE
A-G Ratio: 0.9 (ref 0.8–1.7)
ALT (SGPT): 19 U/L (ref 16–61)
AST (SGOT): 27 U/L (ref 15–37)
Albumin: 3.4 g/dL (ref 3.4–5.0)
Alk. phosphatase: 133 U/L — ABNORMAL HIGH (ref 45–117)
Anion gap: 10 mmol/L (ref 3.0–18)
BUN/Creatinine ratio: 10 — ABNORMAL LOW (ref 12–20)
BUN: 9 MG/DL (ref 7.0–18)
Bilirubin, total: 0.6 MG/DL (ref 0.2–1.0)
CO2: 25 mmol/L (ref 21–32)
Calcium: 8.8 MG/DL (ref 8.5–10.1)
Chloride: 107 mmol/L (ref 100–108)
Creatinine: 0.89 MG/DL (ref 0.6–1.3)
GFR est AA: >60 mL/min/{1.73_m2} (ref >60)
GFR est non-AA: >60 mL/min/{1.73_m2} (ref >60)
Globulin: 3.9 g/dL (ref 2.0–4.0)
Glucose: 134 mg/dL — ABNORMAL HIGH (ref 74–99)
Potassium: 3.9 mmol/L (ref 3.5–5.5)
Protein, total: 7.3 g/dL (ref 6.4–8.2)
Sodium: 142 mmol/L (ref 136–145)

## 2016-12-21 LAB — LIPASE: Lipase: 121 U/L (ref 73–393)

## 2016-12-21 LAB — CBC WITH AUTOMATED DIFF
ABS. BASOPHILS: 0 10*3/uL (ref 0.0–0.06)
ABS. EOSINOPHILS: 0.2 10*3/uL (ref 0.0–0.4)
ABS. LYMPHOCYTES: 1.8 10*3/uL (ref 0.9–3.6)
ABS. MONOCYTES: 0.4 10*3/uL (ref 0.05–1.2)
ABS. NEUTROPHILS: 3.8 10*3/uL (ref 1.8–8.0)
BASOPHILS: 0 % (ref 0–2)
EOSINOPHILS: 3 % (ref 0–5)
HCT: 39.1 % (ref 36.0–48.0)
HGB: 13 g/dL (ref 13.0–16.0)
LYMPHOCYTES: 29 % (ref 21–52)
MCH: 27.8 PG (ref 24.0–34.0)
MCHC: 33.2 g/dL (ref 31.0–37.0)
MCV: 83.5 FL (ref 74.0–97.0)
MONOCYTES: 6 % (ref 3–10)
MPV: 10.7 FL (ref 9.2–11.8)
NEUTROPHILS: 62 % (ref 40–73)
PLATELET: 162 10*3/uL (ref 135–420)
RBC: 4.68 M/uL — ABNORMAL LOW (ref 4.70–5.50)
RDW: 14.4 % (ref 11.6–14.5)
WBC: 6.1 10*3/uL (ref 4.6–13.2)

## 2016-12-21 MED ORDER — ONDANSETRON 4 MG TAB, RAPID DISSOLVE
4 mg | ORAL_TABLET | Freq: Three times a day (TID) | ORAL | 0 refills | Status: DC | PRN
Start: 2016-12-21 — End: 2018-06-11

## 2016-12-21 MED ORDER — ONDANSETRON 4 MG TAB, RAPID DISSOLVE
4 mg | ORAL | Status: AC
Start: 2016-12-21 — End: 2016-12-21
  Administered 2016-12-21: 20:00:00 via ORAL

## 2016-12-21 MED ORDER — TRAMADOL 50 MG TAB
50 mg | ORAL | Status: DC
Start: 2016-12-21 — End: 2016-12-21

## 2016-12-21 MED ORDER — FAMOTIDINE (PF) 20 MG/2 ML IV
20 mg/2 mL | INTRAVENOUS | Status: DC
Start: 2016-12-21 — End: 2016-12-21

## 2016-12-21 MED ORDER — ONDANSETRON (PF) 4 MG/2 ML INJECTION
4 mg/2 mL | INTRAMUSCULAR | Status: DC
Start: 2016-12-21 — End: 2016-12-21

## 2016-12-21 MED ORDER — HYDROCODONE-ACETAMINOPHEN 5 MG-325 MG TAB
5-325 mg | ORAL | Status: AC
Start: 2016-12-21 — End: 2016-12-21
  Administered 2016-12-21: 20:00:00 via ORAL

## 2016-12-21 MED FILL — HYDROCODONE-ACETAMINOPHEN 5 MG-325 MG TAB: 5-325 mg | ORAL | Qty: 2

## 2016-12-21 MED FILL — ONDANSETRON 4 MG TAB, RAPID DISSOLVE: 4 mg | ORAL | Qty: 1

## 2016-12-21 NOTE — ED Notes (Signed)
Patient reports he had glass in his food recently and was told to return to ED id abdominal issues continue. Patient reports he has had a decrease in po intake.

## 2016-12-21 NOTE — ED Notes (Signed)
I have reviewed discharge instructions with the patient.  The patient verbalized understanding.

## 2016-12-21 NOTE — ED Triage Notes (Signed)
Patient ambulatory to ED. Was seen here about 2 weeks ago was seen for glass in abdomen. States he is still having stomach ache have blood in stool. States fell on steps and hand injured left hip.

## 2016-12-21 NOTE — ED Notes (Signed)
Attempted IV access X 2 unsuccessfully, but blood draw was successful. ED provider aware and will change medications to PO.

## 2016-12-21 NOTE — ED Provider Notes (Signed)
EMERGENCY DEPARTMENT HISTORY AND PHYSICAL EXAM    Date: 12/21/2016  Patient Name: Jordan Weiss    History of Presenting Illness     Chief Complaint   Patient presents with   ??? Abdominal Pain   ??? Hip Pain         History Provided By: Patient    Chief Complaint: abdominal pain  Duration: 2 Weeks  Timing:  Constant  Location: generalized  Quality: Aching  Severity: 8 out of 10  Modifying Factors: pt feels nauseous everytime he tries to eat  Associated Symptoms: nausea, blood in stool, and left hip pain    Additional History (Context):   2:48 PM  Jordan Weiss is a 46 y.o. male with PMHX of gastric bypass, hernia repair, HTN, and OA who presents to the emergency department C/O generalized abdominal pain. Associated sxs include nausea, blood in stool, and left hip pain. Pt took Tylenol, Ibuprofen, Aleve, and BC Powder without relief. Pt reports he was seen here in the ED approximately 2 weeks ago for swallowing glass. Pt states he is having abdominal pain, is unable to eat, and has blood in stool (x4-5 episodes) and underwear. Pt gets very nauseous when he eats but does not vomit. States when he was going to the bathroom her fell and twisted his left hip out. Pain in unchanged with lying down and ambulating.  Pt denies vomiting, rectal pain, abdomen distention, weakness, fevers, chills, and any other sxs or complaints.     PCP: None    Current Outpatient Prescriptions   Medication Sig Dispense Refill   ??? ondansetron (ZOFRAN ODT) 4 mg disintegrating tablet Take 1 Tab by mouth every eight (8) hours as needed for Nausea. 10 Tab 0   ??? atenolol (TENORMIN) 25 mg tablet Take 1 Tab by mouth daily. 30 Tab 3   ??? tamsulosin (FLOMAX) 0.4 mg capsule Take 1 Cap by mouth daily. 30 Cap 3   ??? hydroCHLOROthiazide (HYDRODIURIL) 25 mg tablet Take 1 Tab by mouth daily. 30 Tab 3   ??? dilTIAZem CD (CARDIZEM CD) 120 mg ER capsule Take 1 Cap by mouth daily. 30 Cap 3   ??? methocarbamol (ROBAXIN) 500 mg tablet Take 2 Tabs by mouth four (4)  times daily. 24 Tab 0   ??? nitroglycerin (NITROSTAT) 0.4 mg SL tablet 1 Tab by SubLINGual route every five (5) minutes as needed for Chest Pain. 1 Bottle prn       Past History     Past Medical History:  Past Medical History:   Diagnosis Date   ??? CAD (coronary artery disease)    ??? Cancer ([place])     kidney   ??? DDD (degenerative disc disease), lumbar    ??? Diabetes ([place])    ??? Hypertension    ??? OA (osteoarthritis) of knee    ??? Renal lesion        Past Surgical History:  Past Surgical History:   Procedure Laterality Date   ??? HX GASTRIC BYPASS  2005   ??? HX HERNIA REPAIR  2015   ??? HX ORTHOPAEDIC  2013    fractured back   ??? HX OTHER SURGICAL Bilateral 2012, 2014    Rotator cuff   ??? HX OTHER SURGICAL Left 1989    degloved injury   ??? HX SHOULDER ARTHROSCOPY      rotator cuff repair x 2       Family History:  Family History   Problem Relation Age of Onset   ??? Diabetes  Mother    ??? Cancer Mother      breast   ??? Heart Disease Mother    ??? Cancer Father      lung   ??? Diabetes Father    ??? Stroke Father    ??? Diabetes Sister    ??? Hypertension Sister    ??? Osteoporosis Sister    ??? Cancer Sister      cervical cancer   ??? Heart Failure Sister    ??? Hypertension Sister    ??? Kidney Disease Brother      had transplant   ??? Hypertension Brother    ??? Diabetes Brother    ??? Hypertension Sister    ??? Diabetes Brother    ??? Stroke Brother    ??? Hypertension Brother    ??? Heart Attack Brother    ??? Heart Attack Brother    ??? Hypertension Brother        Social History:  Social History   Substance Use Topics   ??? Smoking status: Former Smoker   ??? Smokeless tobacco: Never Used   ??? Alcohol use 0.0 oz/week     0 Standard drinks or equivalent per week      Comment: rare       Allergies:  Allergies   Allergen Reactions   ??? Heparin Analogues Shortness of Breath and Angioedema         Review of Systems   Review of Systems   Constitutional: Negative for chills, fatigue and fever.   HENT: Negative for congestion, rhinorrhea and sore throat.     Eyes: Negative for visual disturbance.   Respiratory: Negative for cough and shortness of breath.    Cardiovascular: Negative for chest pain and palpitations.   Gastrointestinal: Positive for abdominal pain (generalized), blood in stool and nausea. Negative for abdominal distention, constipation, diarrhea, rectal pain and vomiting.   Musculoskeletal: Positive for arthralgias (left hip pain). Negative for back pain, myalgias and neck pain.   Skin: Negative.    Allergic/Immunologic: Negative.    Neurological: Negative for dizziness, weakness and headaches.   Psychiatric/Behavioral: Negative.        Physical Exam     Vitals:    12/21/16 1442   BP: (!) 169/95   Pulse: (!) 58   Resp: 16   Temp: 98.1 ??F (36.7 ??C)   SpO2: 99%   Weight: 120.2 kg (265 lb)   Height: 6' 3" (1.905 m)     Physical Exam   Constitutional: He is oriented to person, place, and time. Vital signs are normal. He appears well-developed and well-nourished. He is active and cooperative.  Non-toxic appearance. He does not have a sickly appearance. No distress.   Pt well-appearing, non-toxic   HENT:   Head: Normocephalic and atraumatic.   Nose: Nose normal.   Mouth/Throat: Oropharynx is clear and moist and mucous membranes are normal.   Eyes: Conjunctivae and EOM are normal.   Neck: Normal range of motion. Neck supple.   Cardiovascular: Normal rate, regular rhythm and normal heart sounds.    Pulmonary/Chest: Effort normal and breath sounds normal. No respiratory distress.   Abdominal: Soft. Normal appearance and bowel sounds are normal. There is no tenderness. There is no rebound.   Musculoskeletal: Normal range of motion. He exhibits no edema.   Neurological: He is alert and oriented to person, place, and time. He has normal strength.   Skin: Skin is warm and intact.   Psychiatric: He has a normal mood and affect. His  speech is normal and behavior is normal. Judgment and thought content normal. Cognition and memory are normal.    Nursing note and vitals reviewed.        Diagnostic Study Results     Labs -     Recent Results (from the past 12 hour(s))   METABOLIC PANEL, COMPREHENSIVE    Collection Time: 12/21/16  3:30 PM   Result Value Ref Range    Sodium 142 136 - 145 mmol/L    Potassium 3.9 3.5 - 5.5 mmol/L    Chloride 107 100 - 108 mmol/L    CO2 25 21 - 32 mmol/L    Anion gap 10 3.0 - 18 mmol/L    Glucose 134 (H) 74 - 99 mg/dL    BUN 9 7.0 - 18 MG/DL    Creatinine 0.89 0.6 - 1.3 MG/DL    BUN/Creatinine ratio 10 (L) 12 - 20      GFR est AA >60 >60 ml/min/1.30m    GFR est non-AA >60 >60 ml/min/1.774m   Calcium 8.8 8.5 - 10.1 MG/DL    Bilirubin, total 0.6 0.2 - 1.0 MG/DL    ALT (SGPT) 19 16 - 61 U/L    AST (SGOT) 27 15 - 37 U/L    Alk. phosphatase 133 (H) 45 - 117 U/L    Protein, total 7.3 6.4 - 8.2 g/dL    Albumin 3.4 3.4 - 5.0 g/dL    Globulin 3.9 2.0 - 4.0 g/dL    A-G Ratio 0.9 0.8 - 1.7     CBC WITH AUTOMATED DIFF    Collection Time: 12/21/16  3:30 PM   Result Value Ref Range    WBC 6.1 4.6 - 13.2 K/uL    RBC 4.68 (L) 4.70 - 5.50 M/uL    HGB 13.0 13.0 - 16.0 g/dL    HCT 39.1 36.0 - 48.0 %    MCV 83.5 74.0 - 97.0 FL    MCH 27.8 24.0 - 34.0 PG    MCHC 33.2 31.0 - 37.0 g/dL    RDW 14.4 11.6 - 14.5 %    PLATELET 162 135 - 420 K/uL    MPV 10.7 9.2 - 11.8 FL    NEUTROPHILS 62 40 - 73 %    LYMPHOCYTES 29 21 - 52 %    MONOCYTES 6 3 - 10 %    EOSINOPHILS 3 0 - 5 %    BASOPHILS 0 0 - 2 %    ABS. NEUTROPHILS 3.8 1.8 - 8.0 K/UL    ABS. LYMPHOCYTES 1.8 0.9 - 3.6 K/UL    ABS. MONOCYTES 0.4 0.05 - 1.2 K/UL    ABS. EOSINOPHILS 0.2 0.0 - 0.4 K/UL    ABS. BASOPHILS 0.0 0.0 - 0.06 K/UL    DF AUTOMATED     LIPASE    Collection Time: 12/21/16  3:30 PM   Result Value Ref Range    Lipase 121 73 - 393 U/L       Radiologic Studies -   No orders to display     CT Results  (Last 48 hours)    None        CXR Results  (Last 48 hours)    None          Medications given in the ED-  Medications   ondansetron (ZOFRAN ODT) tablet 4 mg (4 mg Oral Given 12/21/16 1615)    HYDROcodone-acetaminophen (NORCO) 5-325 mg per tablet 2 Tab (2 Tabs Oral Given 12/21/16 1614)  Medical Decision Making   I am the first provider for this patient.    I reviewed the vital signs, available nursing notes, past medical history, past surgical history, family history and social history.    Vital Signs-Reviewed the patient's vital signs.    Pulse Oximetry Analysis - 99% on RA     Records Reviewed: Nursing Notes and Old Medical Records    Provider Notes (Medical Decision Making): Generalized abd pain, nausea, reports intermittent blood in stool. Recent hx of swallowing glass, was seen in ED.  Check hemoccult, CBC for GI bleed.  Check lipase and CMP.  Diverticulitis v. Pancreatitis v cholecystitis v gastritis    Procedures:  Procedures     PROCEDURE NOTE - RECTAL EXAM:   Performed by: Darreld Mclean, PA-C  Rectal exam performed.  Brown stool was collected.  Stool was Hemoccult tested, and found to be heme Negative.   The procedure took 1-15 minutes, and pt tolerated well.  Written by Priscille Heidelberg, ED Scribe, as dictated by Darreld Mclean, PA-C.       ED Course:   2:48 PM  Initial assessment performed. The patients presenting problems have been discussed, and they are in agreement with the care plan formulated and outlined with them.  I have encouraged them to ask questions as they arise throughout their visit.    5:38 PM  Pt reports his pain has improved, no vomiting in ED.  Lab results reviewed with pt.  Discussed plan to follow up with GI.  No signs of GI bleed at this time.    Diagnosis and Disposition     DISCHARGE NOTE:  5:45 PM  Jordan Weiss's  results have been reviewed with him.  He has been counseled regarding his diagnosis, treatment, and plan.  He verbally conveys understanding and agreement of the signs, symptoms, diagnosis, treatment and prognosis and additionally agrees to follow up as discussed.  He also agrees with the care-plan and conveys that all of his questions  have been answered.  I have also provided discharge instructions for him that include: educational information regarding their diagnosis and treatment, and list of reasons why they would want to return to the ED prior to their follow-up appointment, should his condition change. He has been provided with education for proper emergency department utilization.     CLINICAL IMPRESSION:    1. Acute generalized abdominal pain        PLAN:  1. D/C Home  2.   Current Discharge Medication List      START taking these medications    Details   ondansetron (ZOFRAN ODT) 4 mg disintegrating tablet Take 1 Tab by mouth every eight (8) hours as needed for Nausea.  Qty: 10 Tab, Refills: 0           3.   Follow-up Information     None        _______________________________    Attestations:  This note is prepared by Christena Deem, acting as Scribe for [employer], PA-C.    Levenia Skalicky, PA-C:  The scribe's documentation has been prepared under my direction and personally reviewed by me in its entirety.  I confirm that the note above accurately reflects all work, treatment, procedures, and medical decision making performed by me.    _______________________________

## 2017-02-12 ENCOUNTER — Emergency Department: Admit: 2017-02-12 | Payer: Worker's Compensation | Primary: Physician Assistant

## 2017-02-12 ENCOUNTER — Inpatient Hospital Stay
Admit: 2017-02-12 | Discharge: 2017-02-12 | Disposition: A | Discharge status: Home / Self Care | Payer: Worker's Compensation | Attending: Emergency Medicine

## 2017-02-12 DIAGNOSIS — S61452A Open bite of left hand, initial encounter: Secondary | ICD-10-CM

## 2017-02-12 MED ORDER — HYDROCHLOROTHIAZIDE 25 MG TAB
25 mg | Freq: Once | ORAL | Status: AC
Start: 2017-02-12 — End: 2017-02-12
  Administered 2017-02-12: 16:00:00 via ORAL

## 2017-02-12 MED ORDER — HYDROCHLOROTHIAZIDE 25 MG TAB
25 mg | Freq: Every day | ORAL | Status: DC
Start: 2017-02-12 — End: 2017-02-12

## 2017-02-12 MED ORDER — IBUPROFEN 400 MG TAB
400 mg | Freq: Once | ORAL | Status: AC
Start: 2017-02-12 — End: 2017-02-12
  Administered 2017-02-12: 15:00:00 via ORAL

## 2017-02-12 MED ORDER — HYDROCHLOROTHIAZIDE 25 MG TAB
25 mg | Freq: Once | ORAL | Status: DC
Start: 2017-02-12 — End: 2017-02-12

## 2017-02-12 MED ORDER — ACETAMINOPHEN 325 MG TABLET
325 mg | ORAL | Status: AC
Start: 2017-02-12 — End: 2017-02-12
  Administered 2017-02-12: 15:00:00 via ORAL

## 2017-02-12 MED ORDER — ATENOLOL 25 MG TAB
25 mg | ORAL | Status: AC
Start: 2017-02-12 — End: 2017-02-12
  Administered 2017-02-12: 16:00:00 via ORAL

## 2017-02-12 MED ORDER — DILTIAZEM ER 120 MG 24 HR CAP
120 mg | Freq: Once | ORAL | Status: AC
Start: 2017-02-12 — End: 2017-02-12
  Administered 2017-02-12: 17:00:00 via ORAL

## 2017-02-12 MED ORDER — LIDOCAINE 4 % TOPICAL PATCH (12 HOUR DURATION)
4 % | Freq: Once | CUTANEOUS | Status: DC
Start: 2017-02-12 — End: 2017-02-12

## 2017-02-12 MED ORDER — DIPHTH,PERTUS(AC)TETANUS VAC(PF) 2.5 LF UNIT-8 MCG-5 LF/0.5 ML INJ
INTRAMUSCULAR | Status: AC
Start: 2017-02-12 — End: 2017-02-12
  Administered 2017-02-12: 18:00:00 via INTRAMUSCULAR

## 2017-02-12 MED ORDER — ATENOLOL 25 MG TAB
25 mg | ORAL | Status: DC
Start: 2017-02-12 — End: 2017-02-12

## 2017-02-12 MED FILL — TYLENOL 325 MG TABLET: 325 mg | ORAL | Qty: 2

## 2017-02-12 MED FILL — BOOSTRIX TDAP 2.5 LF UNIT-8 MCG-5 LF/0.5 ML INTRAMUSCULAR SYRINGE: INTRAMUSCULAR | Qty: 0.5

## 2017-02-12 MED FILL — DILTIAZEM ER 120 MG 24 HR CAP: 120 mg | ORAL | Qty: 1

## 2017-02-12 MED FILL — IBUPROFEN 400 MG TAB: 400 mg | ORAL | Qty: 2

## 2017-02-12 MED FILL — ASPERCREME (LIDOCAINE) 4 % TOPICAL PATCH: 4 % | CUTANEOUS | Qty: 1

## 2017-02-12 MED FILL — HYDROCHLOROTHIAZIDE 25 MG TAB: 25 mg | ORAL | Qty: 1

## 2017-02-12 MED FILL — ATENOLOL 25 MG TAB: 25 mg | ORAL | Qty: 1

## 2017-02-12 NOTE — ED Provider Notes (Signed)
EMERGENCY DEPARTMENT HISTORY AND PHYSICAL EXAM    11:09 AM      Date: 02/12/2017  Patient Name: Jordan Weiss    History of Presenting Illness     Chief Complaint   Patient presents with   ??? Snake Bite   ??? Back Pain         History Provided By: Patient    Chief Complaint: Snake bite  Duration: 2 Hours  Timing:  Sudden  Location: Left hand  Quality: Mild tingling and swelling in left hand  Severity: Moderate  Associated Symptoms:Patient denies any other associated symptoms or complaints.      Additional History (Context): Jordan Weiss is a 46 y.o. male with a past medical history of HTN, CAD, cancer, DDD, OA, and DM who presents with c/o snake bite on left hand onset 2 hour ago. Patient states that he was working at a [employer], tied his shoe, and then was bitten by a green snake on his left hand. He could not describe the specific appearance of the snake. Patient has associated symptoms of mild left forearm tingling and mild swelling in the left hand. He notes that he was first sent to [facility] but then was sent to the ER. He notes that he has lower back pain that is worse than before. Patient describes the back pain as exacerbated by moving and throbbing. He denies any lower back surgery or recent back injuries.Patient denies any other associated symptoms or complaints.    PCP: Verne Carrow, MD    Current Facility-Administered Medications   Medication Dose Route Frequency Provider Last Rate Last Dose   ??? lidocaine (SALONPAS/ASPERCREME) 4 % patch 1 Patch  1 Patch TransDERmal ONCE Theodis Sato, MD   1 Patch at 02/12/17 1117     Current Outpatient Prescriptions   Medication Sig Dispense Refill   ??? ENALAPRIL MALEATE PO Take  by mouth. Pt takes, but does not know dosage     ??? atenolol (TENORMIN) 25 mg tablet Take 1 Tab by mouth daily. 30 Tab 3   ??? hydroCHLOROthiazide (HYDRODIURIL) 25 mg tablet Take 1 Tab by mouth daily. 30 Tab 3    ??? dilTIAZem CD (CARDIZEM CD) 120 mg ER capsule Take 1 Cap by mouth daily. 30 Cap 3   ??? ondansetron (ZOFRAN ODT) 4 mg disintegrating tablet Take 1 Tab by mouth every eight (8) hours as needed for Nausea. 10 Tab 0   ??? methocarbamol (ROBAXIN) 500 mg tablet Take 2 Tabs by mouth four (4) times daily. 24 Tab 0   ??? tamsulosin (FLOMAX) 0.4 mg capsule Take 1 Cap by mouth daily. 30 Cap 3   ??? nitroglycerin (NITROSTAT) 0.4 mg SL tablet 1 Tab by SubLINGual route every five (5) minutes as needed for Chest Pain. 1 Bottle prn       Past History     Past Medical History:  Past Medical History:   Diagnosis Date   ??? CAD (coronary artery disease)    ??? Cancer ([place])     kidney   ??? DDD (degenerative disc disease), lumbar    ??? Diabetes ([place])    ??? Hypertension    ??? OA (osteoarthritis) of knee    ??? Renal lesion        Past Surgical History:  Past Surgical History:   Procedure Laterality Date   ??? HX GASTRIC BYPASS  2005   ??? HX HERNIA REPAIR  2015   ??? HX ORTHOPAEDIC  2013  fractured back   ??? HX OTHER SURGICAL Bilateral 2012, 2014    Rotator cuff   ??? HX OTHER SURGICAL Left 1989    degloved injury   ??? HX SHOULDER ARTHROSCOPY      rotator cuff repair x 2       Family History:  Family History   Problem Relation Age of Onset   ??? Diabetes Mother    ??? Cancer Mother      breast   ??? Heart Disease Mother    ??? Cancer Father      lung   ??? Diabetes Father    ??? Stroke Father    ??? Diabetes Sister    ??? Hypertension Sister    ??? Osteoporosis Sister    ??? Cancer Sister      cervical cancer   ??? Heart Failure Sister    ??? Hypertension Sister    ??? Kidney Disease Brother      had transplant   ??? Hypertension Brother    ??? Diabetes Brother    ??? Hypertension Sister    ??? Diabetes Brother    ??? Stroke Brother    ??? Hypertension Brother    ??? Heart Attack Brother    ??? Heart Attack Brother    ??? Hypertension Brother        Social History:  Social History   Substance Use Topics   ??? Smoking status: Former Smoker   ??? Smokeless tobacco: Never Used   ??? Alcohol use 0.0 oz/week      0 Standard drinks or equivalent per week      Comment: rare       Allergies:  Allergies   Allergen Reactions   ??? Heparin Analogues Shortness of Breath and Angioedema         Review of Systems       Review of Systems   Constitutional: Negative for activity change and appetite change.   HENT: Negative for congestion.    Eyes: Negative for visual disturbance.   Respiratory: Negative for cough and shortness of breath.    Cardiovascular: Negative for chest pain.   Gastrointestinal: Negative for abdominal pain, diarrhea, nausea and vomiting.   Genitourinary: Negative for dysuria.   Musculoskeletal: Positive for back pain (lower). Negative for arthralgias and myalgias.        Mild left hand swelling.   Skin: Positive for wound (Left hand snake bite). Negative for rash.   Neurological: Positive for numbness (Mild, in left hand). Negative for weakness.   All other systems reviewed and are negative.        Physical Exam     Visit Vitals   ??? BP 158/88   ??? Pulse (!) 58   ??? Temp 98.5 ??F (36.9 ??C)   ??? Resp 16   ??? Wt 127 kg (280 lb)   ??? SpO2 98%   ??? BMI 35 kg/m2         Physical Exam   Constitutional: He is oriented to person, place, and time. He appears well-developed and well-nourished.   HENT:   Head: Normocephalic and atraumatic.   Mouth/Throat: Oropharynx is clear and moist.   Eyes: Conjunctivae are normal.   Neck: Normal range of motion. Neck supple. No JVD present.   Cardiovascular: Normal rate, regular rhythm, normal heart sounds and intact distal pulses.    No murmur heard.  Pulmonary/Chest: Effort normal and breath sounds normal.   Abdominal: Soft. Bowel sounds are normal. He exhibits no distension. There is  no tenderness.   Musculoskeletal: Normal range of motion. He exhibits tenderness. He exhibits no deformity.   Generalized tenderness across lower back.   Lymphadenopathy:     He has no cervical adenopathy.   Neurological: He is alert and oriented to person, place, and time. Coordination normal.    Skin: Skin is warm and dry. No rash noted.   Small abrasion on the ulnar dorsal surface of his left hand with minimal swelling.  No puncture wounds.   Psychiatric: He has a normal mood and affect.   Nursing note and vitals reviewed.        Diagnostic Study Results     Labs -  No results found for this or any previous visit (from the past 12 hour(s)).    Radiologic Studies -   XR SPINE LUMB MIN 4 V   Final Result        Xr Spine Lumb Min 4 V    Result Date: 02/12/2017  EXAM: X-ray of the Lumbar Spine. INDICATION: Back pain after back injury after being bit by snake TECHNIQUE: AP, lateral, bilateral oblique and spot views of the lumbar spine obtained. COMPARISON: 12/09/2016, 09/16/2016 and 08/12/2015 FINDINGS: There are 5 lumbar-type vertebra. There is 4 mm anterolisthesis of L5 on S1. No definite pars defect appreciated. The pars are possibly elongated. No fracture appreciated. Facet arthropathy is noted at L3-L4, L4-L5 and L5-S1. Mild multilevel disc space height loss is noted. This is most pronounced at L4-L5 and L5-S1. This is grossly unchanged from prior examinations.     IMPRESSION: 1.  Multilevel degenerative disc disease with mild anterolisthesis of L5 on S1 similar to prior imaging.      Medical Decision Making   I am the first provider for this patient.    I reviewed the vital signs, available nursing notes, past medical history, past surgical history, family history and social history.    Vital Signs-Reviewed the patient's vital signs.    Pulse Oximetry Analysis -  100% on room air (normal)    Records Reviewed: Nursing Notes (Time of Review: 11:09 AM)    ED Course: Progress Notes, Reevaluation, and Consults:  11:00 AM Consult: I discussed care with [place].  It was a standard discussion including patient history, chief complaint, available diagnostic results, and predicted treatment course. Melvina recommends watching the patient for 6 hours since the time of the bite.  During this will check whether patient's tetanus is up to date. There are no recommendations for treatment or tests to be done if no local symptoms after 6 hours, and patient can be discharged.    1400  Patient re-evaluated at 6 hours from bite. Asymptomatic, appears well. The patient will be discharged home. Findings were discussed at length and questions were answered. Information on all newly prescribed medications was given. the patient was instructed to follow-up with his PCP, or return to the Emergency Department with any worsened symptoms or concerns. Return precautions were given.      Provider Notes (Medical Decision Making): 46 year old male with a past medical history of HTN, CAD, kidney cancer, DDD, OA, and DM who presents with a snake bite on his left hand onset 2 hours ago. 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 Patient has associated symptoms of mild left forearm tingling and mild swelling in the left hand. He also c/o lower back pain that is worse than his chronic  back problem. He describes the back pain as throbbing and exacerbated by movements. Patient denies any other associated symptoms or complaints. No puncture wound, small abrasion. Minimal local symptoms.    Differential Diagnosis: snake bite with low likelyhood of envenomation.    Testing: none indicated, will observe in ED for developing symptoms.  Treatments: tetanus    Diagnosis     Clinical Impression:   1. Nonvenomous snake bite, initial encounter    2. Strain of lumbar region, initial encounter        Disposition: Discharge    Follow-up Information     Follow up With Details Comments [place], MD  As needed [address]  [place] [address]  [phone]             Discharge Medication List as of 02/12/2017  2:26 PM      CONTINUE these medications which have NOT CHANGED    Details   ENALAPRIL MALEATE PO Take  by mouth. Pt takes, but does not know dosage,  Historical Med      atenolol (TENORMIN) 25 mg tablet Take 1 Tab by mouth daily., Normal, Disp-30 Tab, R-3      hydroCHLOROthiazide (HYDRODIURIL) 25 mg tablet Take 1 Tab by mouth daily., Normal, Disp-30 Tab, R-3      dilTIAZem CD (CARDIZEM CD) 120 mg ER capsule Take 1 Cap by mouth daily., Normal, Disp-30 Cap, R-3      ondansetron (ZOFRAN ODT) 4 mg disintegrating tablet Take 1 Tab by mouth every eight (8) hours as needed for Nausea., Print, Disp-10 Tab, R-0      methocarbamol (ROBAXIN) 500 mg tablet Take 2 Tabs by mouth four (4) times daily., Print, Disp-24 Tab, R-0      tamsulosin (FLOMAX) 0.4 mg capsule Take 1 Cap by mouth daily., Normal, Disp-30 Cap, R-3      nitroglycerin (NITROSTAT) 0.4 mg SL tablet 1 Tab by SubLINGual route every five (5) minutes as needed for Chest Pain., Normal, Disp-1 Bottle, R-prn           _______________________________    Attestations:  Scribe Attestation     Reed Pandy acting as a Education administrator for and in the presence of Theodis Sato, MD      February 12, 2017 at 11:09 AM       Provider Attestation:      I personally performed the services described in the documentation, reviewed the documentation, as recorded by the scribe in my presence, and it accurately and completely records my words and actions. February 12, 2017 at 11:09 AM - Theodis Sato, MD    _______________________________

## 2017-02-12 NOTE — ED Notes (Signed)
I have reviewed discharge instructions with the patient.  The patient verbalized understanding.  Patient armband removed and shredded  Pt left ED ambulatory, alert and in NAD.

## 2017-02-12 NOTE — ED Notes (Signed)
No changes noted in patient wound.

## 2017-02-12 NOTE — ED Triage Notes (Signed)
"  I was at work and a snake came out and bit me on the hand. I jumped back and hit my back. My back is and had is hurting."

## 2017-02-13 ENCOUNTER — Inpatient Hospital Stay
Admit: 2017-02-13 | Discharge: 2017-02-13 | Disposition: A | Discharge status: Home / Self Care | Payer: Self-pay | Attending: Emergency Medicine

## 2017-02-13 DIAGNOSIS — M545 Low back pain: Secondary | ICD-10-CM

## 2017-02-13 MED ORDER — CHLORZOXAZONE 500 MG TAB
500 mg | ORAL_TABLET | Freq: Three times a day (TID) | ORAL | 0 refills | Status: DC | PRN
Start: 2017-02-13 — End: 2017-04-21

## 2017-02-13 MED ORDER — HYDROCODONE-ACETAMINOPHEN 5 MG-325 MG TAB
5-325 mg | ORAL_TABLET | ORAL | 0 refills | Status: DC | PRN
Start: 2017-02-13 — End: 2017-04-21

## 2017-02-13 NOTE — ED Notes (Signed)
I have reviewed discharge instructions with the patient.  The patient verbalized understanding.

## 2017-02-13 NOTE — ED Triage Notes (Signed)
Patient states that he was seen yesterday at [facility] for a snake bite and low back pain after trying to get away from snake.

## 2017-02-13 NOTE — ED Provider Notes (Signed)
EMERGENCY DEPARTMENT HISTORY AND PHYSICAL EXAM    Date: 02/13/2017  Patient Name: Jordan Weiss    History of Presenting Illness     Chief Complaint   Patient presents with   ??? Back Pain         History Provided By: Patient    Chief Complaint: lower back pain  Duration: 1 Days  Timing:  Acute  Location: lower back  Modifying Factors: Ibuprofen without relief  Associated Symptoms: denies any other associated signs or symptoms    Additional History (Context):   11:19 AM   Jordan Weiss is a 46 y.o. male with PMHX of chronic lower back pain who presents to the emergency department C/O acute on chronic lower back pain radiating downwards towards the buttocks starting yesterday after suddenly jerking back when he was bit by a snake on left hand. Pt was seen by [facility] and instructed to go to the ED [facility]) where he was evaluated by X-ray of L spine. Poison Control was called and patient was cleared after 6 hours of observation. Pt states no issues with snake bite with no changes. Pt is taking Ibuprofen for his sxs without relief and is not on a muscle relaxer. Pt denies urinary retention, urinary/bowel incontinence, saddle paresthesias, and any other sxs or complaints.     PCP: Verne Carrow, MD    Current Outpatient Prescriptions   Medication Sig Dispense Refill   ??? HYDROcodone-acetaminophen (NORCO) 5-325 mg per tablet Take 1 Tab by mouth every four (4) hours as needed for Pain. Max Daily Amount: 6 Tabs. 10 Tab 0   ??? chlorzoxazone (PARAFON FORTE DSC) 500 mg tablet Take 1 Tab by mouth three (3) times daily as needed for Muscle Spasm(s). Do NOT take with other muscle relaxers 15 Tab 0   ??? ENALAPRIL MALEATE PO Take  by mouth. Pt takes, but does not know dosage     ??? ondansetron (ZOFRAN ODT) 4 mg disintegrating tablet Take 1 Tab by mouth every eight (8) hours as needed for Nausea. 10 Tab 0   ??? atenolol (TENORMIN) 25 mg tablet Take 1 Tab by mouth daily. 30 Tab 3    ??? tamsulosin (FLOMAX) 0.4 mg capsule Take 1 Cap by mouth daily. 30 Cap 3   ??? nitroglycerin (NITROSTAT) 0.4 mg SL tablet 1 Tab by SubLINGual route every five (5) minutes as needed for Chest Pain. 1 Bottle prn   ??? hydroCHLOROthiazide (HYDRODIURIL) 25 mg tablet Take 1 Tab by mouth daily. 30 Tab 3   ??? dilTIAZem CD (CARDIZEM CD) 120 mg ER capsule Take 1 Cap by mouth daily. 30 Cap 3       Past History     Past Medical History:  Past Medical History:   Diagnosis Date   ??? CAD (coronary artery disease)    ??? Cancer ([place])     kidney   ??? DDD (degenerative disc disease), lumbar    ??? Diabetes ([place])    ??? Hypertension    ??? OA (osteoarthritis) of knee    ??? Renal lesion        Past Surgical History:  Past Surgical History:   Procedure Laterality Date   ??? HX GASTRIC BYPASS  2005   ??? HX HERNIA REPAIR  2015   ??? HX ORTHOPAEDIC  2013    fractured back   ??? HX OTHER SURGICAL Bilateral 2012, 2014    Rotator cuff   ??? HX OTHER SURGICAL Left 1989    degloved injury   ???  HX SHOULDER ARTHROSCOPY      rotator cuff repair x 2       Family History:  Family History   Problem Relation Age of Onset   ??? Diabetes Mother    ??? Cancer Mother      breast   ??? Heart Disease Mother    ??? Cancer Father      lung   ??? Diabetes Father    ??? Stroke Father    ??? Diabetes Sister    ??? Hypertension Sister    ??? Osteoporosis Sister    ??? Cancer Sister      cervical cancer   ??? Heart Failure Sister    ??? Hypertension Sister    ??? Kidney Disease Brother      had transplant   ??? Hypertension Brother    ??? Diabetes Brother    ??? Hypertension Sister    ??? Diabetes Brother    ??? Stroke Brother    ??? Hypertension Brother    ??? Heart Attack Brother    ??? Heart Attack Brother    ??? Hypertension Brother        Social History:  Social History   Substance Use Topics   ??? Smoking status: Former Smoker   ??? Smokeless tobacco: Never Used   ??? Alcohol use 0.0 oz/week     0 Standard drinks or equivalent per week      Comment: rare       Allergies:  Allergies   Allergen Reactions    ??? Heparin Analogues Shortness of Breath and Angioedema         Review of Systems   Review of Systems   Constitutional: Negative for chills and fever.   Cardiovascular: Negative for leg swelling.   Gastrointestinal: Negative for abdominal pain.        (-) bowel incontinence   Genitourinary: Negative for difficulty urinating and dysuria.        (-) urinary incontinence   Musculoskeletal: Positive for back pain (lower) and myalgias. Negative for arthralgias and joint swelling.   Skin: Negative for color change and rash.        Snake bite to left hand     Neurological: Negative for weakness, numbness and headaches.   Hematological: Negative for adenopathy.   All other systems reviewed and are negative.      Physical Exam     Vitals:    02/13/17 1102   BP: 180/87   Pulse: 70   Resp: 20   Temp: 97.9 ??F (36.6 ??C)   SpO2: 100%   Weight: 122.5 kg (270 lb)   Height: 6\' 3"  (1.905 m)     Physical Exam   Constitutional: He is oriented to person, place, and time. He appears well-developed and well-nourished. No distress.   Caucasian male in NAD. Alert. Ambulating with mild antalgic gait   HENT:   Head: Normocephalic and atraumatic.   Right Ear: External ear normal.   Left Ear: External ear normal.   Eyes: Conjunctivae are normal.   Neck: Normal range of motion.   Cardiovascular: Normal rate, regular rhythm, normal heart sounds and intact distal pulses.  Exam reveals no gallop and no friction rub.    No murmur heard.  Pulses:       Radial pulses are 2+ on the right side, and 2+ on the left side.   Pulmonary/Chest: Effort normal and breath sounds normal. No accessory muscle usage. No tachypnea. No respiratory distress. He has no decreased breath sounds.  He has no wheezes. He has no rhonchi. He has no rales.   Abdominal: Soft. There is no tenderness.   Musculoskeletal:        Lumbar back: He exhibits decreased range of motion, tenderness (no midline spinal tenderness), pain and spasm. He exhibits no deformity (no step off).         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   Skin: Skin is warm and dry. He is not diaphoretic.   Psychiatric: He has a normal mood and affect. Judgment normal.   Nursing note and vitals reviewed.        Diagnostic Study Results     Labs -   No results found for this or any previous visit (from the past 12 hour(s)).    Radiologic Studies -   No orders to display     Medications given in the ED-  Medications - No data to display      Medical Decision Making   I am the first provider for this patient.    I reviewed the vital signs, available nursing notes, past medical history, past surgical history, family history and social history.    Vital Signs-Reviewed the patient's vital signs.    Pulse Oximetry Analysis - 100% on room air     Records Reviewed: Nursing Notes, Old Medical Records and Previous Radiology Studies    Provider Notes (Medical Decision Making): CC of back pain that is reproducible on exam w/ movement/ROM/palpation. Reports it is consistent with prior episodes of back pain. No abdominal complaints/abdomen non tender on exam. No pulsatile mass appreciated. Normal neurologic exam. Not immunosupressed. Doubt epidural abscess, infection, neoplastic etiology. Not consistent with cauda equina. No trauma or midline tenderness. Doubt fracture. Most c/w musculoskeletal etiology. The patient shows no signs or symptoms of developing complication requiring inpatient treatment or management. Further laboratory or radiological investigation is not indicated. Will treat symptomatically with return immediately for new or worsening symptoms. The importance of close follow-up with PMD emphasized to patient.    Procedures:  Procedures    ED Course:   11:19 AM Initial assessment performed. The patients presenting problems have been discussed, and they are in agreement with the care plan  formulated and outlined with them.  I have encouraged them to ask questions as they arise throughout their visit.    Diagnosis and Disposition     Reviewed ED visit to [facility] yesterday. Cleared by Poison control. Snake bite site is unremarkable with small scab. No evidence of infection. Pt states bite site has not changed. Hx of recurrent low back pain. Had L spine Xray yesterday. No low back alarm sxs. Ambulatory. No FND. Will tx pain. Ortho FU. Reasons to RTED discussed with pt. All questions answered. Pt feels comfortable going home at this time. Pt expressed understanding and she agrees with plan.      DISCHARGE NOTE:  11:24 AM   Jordan Weiss's  results have been reviewed with him.  He has been counseled regarding his diagnosis, treatment, and plan.  He verbally conveys understanding and agreement of the signs, symptoms, diagnosis, treatment and prognosis and additionally agrees to follow up as discussed.  He also agrees with the care-plan and conveys that all of his questions have been answered.  I have also provided discharge instructions for him that include: educational information regarding their diagnosis and treatment,  and list of reasons why they would want to return to the ED prior to their follow-up appointment, should his condition change. He has been provided with education for proper emergency department utilization.     CLINICAL IMPRESSION:    1. Acute exacerbation of chronic low back pain        PLAN:  1. D/C Home  2.   Discharge Medication List as of 02/13/2017 12:01 PM      START taking these medications    Details   HYDROcodone-acetaminophen (NORCO) 5-325 mg per tablet Take 1 Tab by mouth every four (4) hours as needed for Pain. Max Daily Amount: 6 Tabs., Print, Disp-10 Tab, R-0      chlorzoxazone (PARAFON FORTE DSC) 500 mg tablet Take 1 Tab by mouth three (3) times daily as needed for Muscle Spasm(s). Do NOT take with other muscle relaxers, Print, Disp-15 Tab, R-0          CONTINUE these medications which have NOT CHANGED    Details   ENALAPRIL MALEATE PO Take  by mouth. Pt takes, but does not know dosage, Historical Med      ondansetron (ZOFRAN ODT) 4 mg disintegrating tablet Take 1 Tab by mouth every eight (8) hours as needed for Nausea., Print, Disp-10 Tab, R-0      atenolol (TENORMIN) 25 mg tablet Take 1 Tab by mouth daily., Normal, Disp-30 Tab, R-3      tamsulosin (FLOMAX) 0.4 mg capsule Take 1 Cap by mouth daily., Normal, Disp-30 Cap, R-3      nitroglycerin (NITROSTAT) 0.4 mg SL tablet 1 Tab by SubLINGual route every five (5) minutes as needed for Chest Pain., Normal, Disp-1 Bottle, R-prn      hydroCHLOROthiazide (HYDRODIURIL) 25 mg tablet Take 1 Tab by mouth daily., Normal, Disp-30 Tab, R-3      dilTIAZem CD (CARDIZEM CD) 120 mg ER capsule Take 1 Cap by mouth daily., Normal, Disp-30 Cap, R-3         STOP taking these medications       methocarbamol (ROBAXIN) 500 mg tablet Comments:   Reason for Stopping:             3.   Follow-up Information     Follow up With Details Comments Contact Info    Bernita Buffy, MD Schedule an appointment as soon as possible for a visit in 2 days For orthopedic follow up [place] [address]  [phone]      [facility] EMERGENCY DEPT  As needed, If symptoms worsen [address]  [place] [address]  [phone]        _______________________________    Attestations:  This note is prepared by Ned Clines, acting as Scribe for [employer], PA-C.    Dionne Bucy, PA-C :  The scribe's documentation has been prepared under my direction and personally reviewed by me in its entirety.  I confirm that the note above accurately reflects all work, treatment, procedures, and medical decision making performed by me.  _______________________________

## 2017-04-21 DIAGNOSIS — S39012A Strain of muscle, fascia and tendon of lower back, initial encounter: Secondary | ICD-10-CM

## 2017-04-21 NOTE — ED Triage Notes (Signed)
Pt ambulatory into ER c/o lower back pain.  Pt reports he was moving furniture today and while lifting "heard and felt a loud pop".

## 2017-04-21 NOTE — ED Provider Notes (Signed)
EMERGENCY DEPARTMENT HISTORY AND PHYSICAL EXAM    Date: 04/21/2017  Patient Name: Jordan Weiss    History of Presenting Illness     Chief Complaint   Patient presents with   ??? Back Pain         History Provided By: Patient    Chief Complaint: back pain  Duration: 14 Hours  Timing:  Acute  Location: low  Modifying Factors:  Aleve and Tylenol Extra Strength  Associated Symptoms: denies any other associated signs or symptoms    Additional History (Context):   8:59 PM  Jordan Weiss is a 46 y.o. male with PMHX of lumbar fracture who presents to the emergency department C/O low back pain onset 14 hours ago. Reports he was picking up a dresser, heard a "pop" in his back, and felt an immediate pain. Pain worsen with twisting and trying to bend over. Used Aleve and Tylenol Extra Strength without relief. No associated symptoms. Pt denies incontinence, difficulty urinating, and any other Sx or complaints. Pt has hx of recurrent low back pain.     PCP: Verne Carrow, MD    Current Outpatient Medications   Medication Sig Dispense Refill   ??? HYDROcodone-acetaminophen (NORCO) 5-325 mg per tablet Take 1 Tab by mouth every four (4) hours as needed for Pain. Max Daily Amount: 6 Tabs. 12 Tab 0   ??? chlorzoxazone (PARAFON FORTE DSC) 500 mg tablet Take 1 Tab by mouth three (3) times daily as needed for Muscle Spasm(s). 21 Tab 0   ??? ENALAPRIL MALEATE PO Take  by mouth. Pt takes, but does not know dosage     ??? atenolol (TENORMIN) 25 mg tablet Take 1 Tab by mouth daily. 30 Tab 3   ??? hydroCHLOROthiazide (HYDRODIURIL) 25 mg tablet Take 1 Tab by mouth daily. 30 Tab 3   ??? dilTIAZem CD (CARDIZEM CD) 120 mg ER capsule Take 1 Cap by mouth daily. 30 Cap 3   ??? ondansetron (ZOFRAN ODT) 4 mg disintegrating tablet Take 1 Tab by mouth every eight (8) hours as needed for Nausea. 10 Tab 0   ??? tamsulosin (FLOMAX) 0.4 mg capsule Take 1 Cap by mouth daily. 30 Cap 3   ??? nitroglycerin (NITROSTAT) 0.4 mg SL tablet 1 Tab by SubLINGual route  every five (5) minutes as needed for Chest Pain. 1 Bottle prn       Past History     Past Medical History:  Past Medical History:   Diagnosis Date   ??? CAD (coronary artery disease)    ??? Cancer ([place])     kidney   ??? DDD (degenerative disc disease), lumbar    ??? Diabetes ([place])    ??? Hypertension    ??? OA (osteoarthritis) of knee    ??? Renal lesion        Past Surgical History:  Past Surgical History:   Procedure Laterality Date   ??? HX GASTRIC BYPASS  2005   ??? HX HERNIA REPAIR  2015   ??? HX ORTHOPAEDIC  2013    fractured back   ??? HX OTHER SURGICAL Bilateral 2012, 2014    Rotator cuff   ??? HX OTHER SURGICAL Left 1989    degloved injury   ??? HX SHOULDER ARTHROSCOPY      rotator cuff repair x 2       Family History:  Family History   Problem Relation Age of Onset   ??? Diabetes Mother    ??? Cancer Mother  breast   ??? Heart Disease Mother    ??? Cancer Father         lung   ??? Diabetes Father    ??? Stroke Father    ??? Diabetes Sister    ??? Hypertension Sister    ??? Osteoporosis Sister    ??? Cancer Sister         cervical cancer   ??? Heart Failure Sister    ??? Hypertension Sister    ??? Kidney Disease Brother         had transplant   ??? Hypertension Brother    ??? Diabetes Brother    ??? Hypertension Sister    ??? Diabetes Brother    ??? Stroke Brother    ??? Hypertension Brother    ??? Heart Attack Brother    ??? Heart Attack Brother    ??? Hypertension Brother        Social History:  Social History     Tobacco Use   ??? Smoking status: Former Smoker   ??? Smokeless tobacco: Never Used   Substance Use Topics   ??? Alcohol use: Yes     Alcohol/week: 0.0 oz     Comment: rare   ??? Drug use: No       Allergies:  Allergies   Allergen Reactions   ??? Heparin Analogues Shortness of Breath and Angioedema         Review of Systems   Review of Systems   Cardiovascular: Negative for leg swelling.   Gastrointestinal:        (-) incontinence   Genitourinary: Negative for difficulty urinating.        (-) incontinence    Musculoskeletal: Positive for back pain and myalgias. Negative for arthralgias and joint swelling.   Neurological: Negative for weakness and numbness.   All other systems reviewed and are negative.       Physical Exam     Vitals:    04/21/17 1941   BP: 158/75   Pulse: 60   Resp: 16   Temp: 97.8 ??F (36.6 ??C)   SpO2: 99%   Weight: 122.5 kg (270 lb)   Height: 6\' 3"  (1.905 m)     Physical Exam   Constitutional: He is oriented to person, place, and time. He appears well-developed and well-nourished. No distress.   Male in NAD. Alert. Appears uncomfortable with movement.    HENT:   Head: Normocephalic and atraumatic.   Right Ear: External ear normal.   Left Ear: External ear normal.   Nose: Nose normal.   Eyes: Conjunctivae are normal.   Neck: Normal range of motion.   Cardiovascular: Normal rate, regular rhythm, normal heart sounds and intact distal pulses. Exam reveals no gallop and no friction rub.   No murmur heard.  Pulmonary/Chest: Effort normal and breath sounds normal. No accessory muscle usage. No tachypnea. No respiratory distress. He has no decreased breath sounds. He has no wheezes. He has no rhonchi. He has no rales.   Musculoskeletal:        Lumbar back: He exhibits decreased range of motion, tenderness, pain and spasm. He exhibits no deformity (no step off).        Back:    Neurological: He is alert and oriented to person, place, and time. He has normal strength. GCS eye subscore is 4. GCS verbal subscore is 5. GCS motor subscore is 6.   Skin: Skin is warm and dry. He is not diaphoretic.   Psychiatric: He has  a normal mood and affect. Judgment normal.   Nursing note and vitals reviewed.         Diagnostic Study Results     Labs -   No results found for this or any previous visit (from the past 12 hour(s)).    Radiologic Studies -   No orders to display     CT Results  (Last 48 hours)    None        CXR Results  (Last 48 hours)    None          Medications given in the ED-  Medications    oxyCODONE-acetaminophen (PERCOCET) 5-325 mg per tablet 1 Tab (1 Tab Oral Given 04/21/17 2127)         Medical Decision Making   I am the first provider for this patient.    I reviewed the vital signs, available nursing notes, past medical history, past surgical history, family history and social history.    Vital Signs-Reviewed the patient's vital signs.    Pulse Oximetry Analysis - 99% on RA     Records Reviewed: Nursing Notes and Old Medical Records    Provider Notes (Medical Decision Making): CC of back pain that is reproducible on exam w/ movement/ROM/palpation. Reports it is consistent with prior episodes of back pain. No abdominal complaints/abdomen non tender on exam. No pulsatile mass appreciated. Normal neurologic exam. Not immunosupressed. Doubt epidural abscess, infection, neoplastic etiology. Not consistent with cauda equina. No trauma or midline tenderness. Doubt fracture. Most c/w musculoskeletal etiology. The patient shows no signs or symptoms of developing complication requiring inpatient treatment or management. Further laboratory or radiological investigation is not indicated. Will treat symptomatically with return immediately for new or worsening symptoms. The importance of close follow-up with PMD emphasized to patient.    Procedures:  Procedures    ED Course:   8:59 PM Initial assessment performed. The patients presenting problems have been discussed, and they are in agreement with the care plan formulated and outlined with them.  I have encouraged them to ask questions as they arise throughout their visit.    Diagnosis and Disposition     No low back alarm sxs. Ambulatory. No FND. Reasons to RTED discussed with pt. All questions answered. Pt feels comfortable going home at this time. Pt expressed understanding and he agrees with plan.      DISCHARGE NOTE:  9:15 PM  Jordan Weiss's  results have been reviewed with him.  He has been  counseled regarding his diagnosis, treatment, and plan.  He verbally conveys understanding and agreement of the signs, symptoms, diagnosis, treatment and prognosis and additionally agrees to follow up as discussed.  He also agrees with the care-plan and conveys that all of his questions have been answered.  I have also provided discharge instructions for him that include: educational information regarding their diagnosis and treatment, and list of reasons why they would want to return to the ED prior to their follow-up appointment, should his condition change. He has been provided with education for proper emergency department utilization.     CLINICAL IMPRESSION:    1. Low back strain, initial encounter        PLAN:  1. D/C Home  2.   Discharge Medication List as of 04/21/2017  9:15 PM      CONTINUE these medications which have CHANGED    Details   HYDROcodone-acetaminophen (NORCO) 5-325 mg per tablet Take 1 Tab by mouth every four (4) hours as  needed for Pain. Max Daily Amount: 6 Tabs., Print, Disp-12 Tab, R-0      chlorzoxazone (PARAFON FORTE DSC) 500 mg tablet Take 1 Tab by mouth three (3) times daily as needed for Muscle Spasm(s)., Print, Disp-21 Tab, R-0         CONTINUE these medications which have NOT CHANGED    Details   ENALAPRIL MALEATE PO Take  by mouth. Pt takes, but does not know dosage, Historical Med      atenolol (TENORMIN) 25 mg tablet Take 1 Tab by mouth daily., Normal, Disp-30 Tab, R-3      hydroCHLOROthiazide (HYDRODIURIL) 25 mg tablet Take 1 Tab by mouth daily., Normal, Disp-30 Tab, R-3      dilTIAZem CD (CARDIZEM CD) 120 mg ER capsule Take 1 Cap by mouth daily., Normal, Disp-30 Cap, R-3      ondansetron (ZOFRAN ODT) 4 mg disintegrating tablet Take 1 Tab by mouth every eight (8) hours as needed for Nausea., Print, Disp-10 Tab, R-0      tamsulosin (FLOMAX) 0.4 mg capsule Take 1 Cap by mouth daily., Normal, Disp-30 Cap, R-3       nitroglycerin (NITROSTAT) 0.4 mg SL tablet 1 Tab by SubLINGual route every five (5) minutes as needed for Chest Pain., Normal, Disp-1 Bottle, R-prn           3.   Follow-up Information     Follow up With Specialties Details Why Contact Info    Aspili, Tressa Busman, MD Internal Medicine Schedule an appointment as soon as possible for a visit For primary care follow up [place] [address]  [phone]      [facility] EMERGENCY DEPT Emergency Medicine Go to As needed, as symptoms worsen [address]  [place] [address]  [phone]        _______________________________    Attestations:  This note is prepared by Delaney Meigs, acting as Scribe for [employer], PA-C.    Dionne Bucy, PA-C:  The scribe's documentation has been prepared under my direction and personally reviewed by me in its entirety.  I confirm that the note above accurately reflects all work, treatment, procedures, and medical decision making performed by me.  _______________________________

## 2017-04-22 ENCOUNTER — Inpatient Hospital Stay
Admit: 2017-04-22 | Discharge: 2017-04-22 | Disposition: A | Discharge status: Home / Self Care | Payer: Self-pay | Attending: Emergency Medicine

## 2017-04-22 MED ORDER — CHLORZOXAZONE 500 MG TAB
500 mg | ORAL_TABLET | Freq: Three times a day (TID) | ORAL | 0 refills | Status: DC | PRN
Start: 2017-04-22 — End: 2018-06-11

## 2017-04-22 MED ORDER — HYDROCODONE-ACETAMINOPHEN 5 MG-325 MG TAB
5-325 mg | ORAL_TABLET | ORAL | 0 refills | Status: DC | PRN
Start: 2017-04-22 — End: 2018-06-11

## 2017-04-22 MED ORDER — OXYCODONE-ACETAMINOPHEN 5 MG-325 MG TAB
5-325 mg | ORAL | Status: AC
Start: 2017-04-22 — End: 2017-04-21
  Administered 2017-04-22: 02:00:00 via ORAL

## 2017-04-22 MED FILL — OXYCODONE-ACETAMINOPHEN 5 MG-325 MG TAB: 5-325 mg | ORAL | Qty: 1

## 2018-06-11 ENCOUNTER — Ambulatory Visit: Attending: Urology | Primary: Physician Assistant

## 2018-06-11 ENCOUNTER — Ambulatory Visit: Admit: 2018-06-11 | Discharge: 2018-06-14 | Attending: Urology | Primary: Physician Assistant

## 2018-06-11 DIAGNOSIS — N2889 Other specified disorders of kidney and ureter: Secondary | ICD-10-CM

## 2018-06-11 LAB — AMB POC URINALYSIS DIP STICK AUTO W/O MICRO
Bilirubin (UA POC): NEGATIVE
Bilirubin, Urine, POC: NEGATIVE
Blood (UA POC): NEGATIVE
Blood (UA POC): NEGATIVE
Glucose (UA POC): NEGATIVE
Glucose, Urine, POC: NEGATIVE
Ketones (UA POC): NEGATIVE
Ketones, Urine, POC: NEGATIVE
Leukocyte Esterase, Urine, POC: NEGATIVE
Leukocyte esterase (UA POC): NEGATIVE
Nitrite, Urine, POC: NEGATIVE
Nitrites (UA POC): NEGATIVE
Protein (UA POC): NEGATIVE
Protein, Urine, POC: NEGATIVE
Specific Gravity, Urine, POC: 1.02 NA (ref 1.001–1.035)
Specific gravity (UA POC): 1.02 (ref 1.001–1.035)
Urobilinogen (UA POC): 0.2 (ref 0.2–1)
Urobilinogen, POC: 0.2 (ref 0.2–1)
pH (UA POC): 7.5 (ref 4.6–8.0)
pH, Urine, POC: 7.5 NA (ref 4.6–8.0)

## 2018-06-11 MED ORDER — TAMSULOSIN SR 0.4 MG 24 HR CAP
0.4 mg | ORAL_CAPSULE | Freq: Every day | ORAL | 3 refills | Status: DC
Start: 2018-06-11 — End: 2020-02-14

## 2018-06-11 NOTE — Progress Notes (Signed)
Jordan Weiss   [birth year]   Encounter Date: 06/11/2018      ASSESSMENT:   Encounter Diagnoses     ICD-10-CM ICD-9-CM   1. Renal mass N28.89 593.9   2. Benign prostatic hyperplasia with lower urinary tract symptoms, symptom details unspecified N40.1 600.01             PLAN:   ?? Reviewed recent CT C/A and imaging from 10/2017 with patient showing no concerning change in mass   ?? CT A/P w/wo cont ordered today to eval renal mass   ?? Will start Flomax 0.4mg  for LUTS   ?? Follow up with Dr. Jimmye Norman         Chief Complaint   Patient presents with   ??? Other     NP Malignant Neoplasm of left kidney       HISTORY OF PRESENT ILLNESS:  Jordan Weiss is a 47 y.o. male who is seen in consultation as referred by Lonia Mad, PA for left renal mass.     Patient last seen by Dr. Jimmye Norman on 11/01/14, at that time the plan was for patient to be scheduled for left partial nephrectomy. Unfortunately, pt lost insurance and lost to follow up. Left renal mass measured 1.5 cm at that time (CT A/P 10/03/14).     In regards to voiding, patient complains of increased hesitancy, frequency and nocturia up to 15x. He is not on any GU meds, on Flomax in the past with benefit. Takes HCTZ. Denies any gross hematuria or dysuria. Asymptomatic for UTI today. No f/c/n/v.     Chronic back pain, seen in ED multiple times for issues diagnosed with DDD. Refer to ortho by PCP a few days ago.     AUA-IPSS:  AUA Symptom Score 06/11/2018   Over the past month how often have you had the sensation that your bladder was not completely empty after you finished urinating? 5   Over the past month, how often have had to urinate again less than 2 hours after you last finished urinating? 5   Over the past month, how often have you found you stopped and started again several times when you urinated? 5   Over the past month, how often have you found it difficult to postpone urination? 5   Over the past month, how often have you had a weak urinary stream? 0    Over the past month, how often have you had to push or strain to begin urinating? 0   Over the past month, how many times did you most typically get up to urinate from the time you went to bed at night until the time you got up in the morning? 5   AUA Score 25       Past Medical History:   Diagnosis Date   ??? CAD (coronary artery disease)    ??? Cancer ([place])     kidney   ??? DDD (degenerative disc disease), lumbar    ??? Diabetes ([place])    ??? Hypertension    ??? OA (osteoarthritis) of knee    ??? Renal lesion        Past Surgical History:   Procedure Laterality Date   ??? HX GASTRIC BYPASS  2005   ??? HX HERNIA REPAIR  2015   ??? HX ORTHOPAEDIC  2013    fractured back   ??? HX OTHER SURGICAL Bilateral 2012, 2014    Rotator cuff   ??? HX OTHER SURGICAL Left 1989  degloved injury   ??? HX SHOULDER ARTHROSCOPY      rotator cuff repair x 2       Social History     Tobacco Use   ??? Smoking status: Former Smoker   ??? Smokeless tobacco: Never Used   Substance Use Topics   ??? Alcohol use: Not Currently     Alcohol/week: 0.0 standard drinks     Comment: rare   ??? Drug use: No       Allergies   Allergen Reactions   ??? Heparin Analogues Shortness of Breath and Angioedema       Family History   Problem Relation Age of Onset   ??? Diabetes Mother    ??? Cancer Mother         breast   ??? Heart Disease Mother    ??? Cancer Father         lung   ??? Diabetes Father    ??? Stroke Father    ??? Diabetes Sister    ??? Hypertension Sister    ??? Osteoporosis Sister    ??? Cancer Sister         cervical cancer   ??? Heart Failure Sister    ??? Hypertension Sister    ??? Kidney Disease Brother         had transplant   ??? Hypertension Brother    ??? Diabetes Brother    ??? Hypertension Sister    ??? Diabetes Brother    ??? Stroke Brother    ??? Hypertension Brother    ??? Heart Attack Brother    ??? Heart Attack Brother    ??? Hypertension Brother        Current Outpatient Medications   Medication Sig Dispense Refill   ??? aspirin 81 mg chewable tablet Take 81 mg by mouth daily.      ??? tamsulosin (FLOMAX) 0.4 mg capsule Take 1 Cap by mouth daily (after dinner). 90 Cap 3   ??? ENALAPRIL MALEATE PO Take  by mouth. Pt takes, but does not know dosage     ??? atenolol (TENORMIN) 25 mg tablet Take 1 Tab by mouth daily. 30 Tab 3   ??? nitroglycerin (NITROSTAT) 0.4 mg SL tablet 1 Tab by SubLINGual route every five (5) minutes as needed for Chest Pain. 1 Bottle prn   ??? hydroCHLOROthiazide (HYDRODIURIL) 25 mg tablet Take 1 Tab by mouth daily. 30 Tab 3       Review of Systems  Constitutional: Fever: No  Skin: Rash: No  HEENT: Hearing difficulty: No  Eyes: Blurred vision: No  Cardiovascular: Chest pain: Yes  Respiratory: Shortness of breath: Yes  Gastrointestinal: Nausea/vomiting: No  Musculoskeletal: Back pain: Yes  Bil  Neurological: Weakness: No  Psychological: Memory loss: No  Comments/additional findings:         PHYSICAL EXAMINATION:   Visit Vitals  BP 137/84   Ht 6\' 3"  (1.905 m)   Wt 275 lb (124.7 kg)   BMI 34.37 kg/m??     Constitutional: WDWN, Pleasant and appropriate affect, No acute distress.    CV:  No peripheral swelling noted  Respiratory: No respiratory distress or difficulties  Abdomen:  No abdominal masses or tenderness. No CVA tenderness.   GU Male:    SCROTUM:  No scrotal rash or lesions noticed.  Normal bilateral testes and epididymis.   PENIS: Urethral meatus normal in location and size. No urethral discharge.  Skin: No jaundice.    Neuro/Psych:  Alert and oriented x 3,  affect appropriate.   Lymphatic:   No enlarged supraclavicular lymph nodes.        REVIEW OF LABS AND IMAGING:    Results for orders placed or performed in visit on 06/11/18   AMB POC URINALYSIS DIP STICK AUTO W/O MICRO   Result Value Ref Range    Color (UA POC) Yellow     Clarity (UA POC) Clear     Glucose (UA POC) Negative Negative    Bilirubin (UA POC) Negative Negative    Ketones (UA POC) Negative Negative    Specific gravity (UA POC) 1.020 1.001 - 1.035    Blood (UA POC) Negative Negative    pH (UA POC) 7.5 4.6 - 8.0     Protein (UA POC) Negative Negative    Urobilinogen (UA POC) 0.2 mg/dL 0.2 - 1    Nitrites (UA POC) Negative Negative    Leukocyte esterase (UA POC) Negative Negative               A copy of today's office visit with all pertinent imaging results and labs were sent to the referring physician.      CC: Lonia Mad, [place]     Vonna Kotyk P. Kandyce Rud, MD   [facility] [place] [address]    Medical documentation provided with the assistance of Aileen Fass, medical scribe for Lenord Fellers, MD.    Patient's BMI is out of the normal parameters.  Information about BMI was given and patient was advised to follow-up with their PCP for further management.

## 2018-06-11 NOTE — Progress Notes (Signed)
Progress Notes by Lenord Fellers, MD at 06/11/18 1130                Author: Lenord Fellers, MD  Service: --  Author Type: Physician       Filed: 06/14/18 2236  Encounter Date: 06/11/2018  Status: Signed          Editor: Lenord Fellers, MD (Physician)                       Jordan Weiss    08-01-1970    Encounter Date: 06/11/2018         ASSESSMENT:      Encounter Diagnoses              ICD-10-CM  ICD-9-CM          1.  Renal mass  N28.89  593.9          2.  Benign prostatic hyperplasia with lower urinary tract symptoms, symptom details unspecified  N40.1  600.01                    PLAN:    ??  Reviewed recent CT C/A and imaging from 10/2017 with patient showing no concerning change in mass    ??  CT A/P w/wo cont ordered today to eval renal mass    ??  Will start Flomax 0.4mg  for LUTS    ??  Follow up with Dr. Jimmye Norman               Chief Complaint       Patient presents with        ?  Other             NP Malignant Neoplasm of left kidney           HISTORY OF PRESENT ILLNESS:  Jordan Weiss  is a 47 y.o. male who is seen  in consultation as referred by Lonia Mad, PA for left renal mass.       Patient last seen by Dr. Jimmye Norman on 11/01/14, at that time the plan was for patient to be scheduled for left partial nephrectomy. Unfortunately, pt lost insurance and lost to follow up. Left renal  mass measured 1.5 cm at that time (CT A/P 10/03/14).       In regards to voiding, patient complains of increased hesitancy, frequency and nocturia up to 15x. He is not on any GU meds, on Flomax in the past with benefit. Takes HCTZ. Denies any gross hematuria or dysuria. Asymptomatic for UTI today. No f/c/n/v.       Chronic back pain, seen in ED multiple times for issues diagnosed with DDD. Refer to ortho by PCP a few days ago.       AUA-IPSS:      AUA Symptom Score  06/11/2018        Over the past month how often have you had the sensation that your bladder was not completely empty after you finished  urinating?  5        Over the past month, how often have had to urinate again less than 2 hours after you last finished urinating?  5     Over the past month, how often have you found you stopped and started again several times when you urinated?  5     Over the past month, how often have you found it difficult to postpone urination?  5  Over the past month, how often have you had a weak urinary stream?  0     Over the past month, how often have you had to push or strain to begin urinating?  0     Over the past month, how many times did you most typically get up to urinate from the time you went to bed at night until the time you got up in  the morning?  5        AUA Score  25             Past Medical History:        Diagnosis  Date         ?  CAD (coronary artery disease)       ?  Cancer [facility])            kidney         ?  DDD (degenerative disc disease), lumbar       ?  Diabetes ([place])       ?  Hypertension       ?  OA (osteoarthritis) of knee           ?  Renal lesion               Past Surgical History:         Procedure  Laterality  Date          ?  HX GASTRIC BYPASS    2005     ?  HX HERNIA REPAIR    2015     ?  HX ORTHOPAEDIC    2013          fractured back          ?  HX OTHER SURGICAL  Bilateral  2012, 2014          Rotator cuff          ?  HX OTHER SURGICAL  Left  1989          degloved injury          ?  HX SHOULDER ARTHROSCOPY              rotator cuff repair x 2             Social History          Tobacco Use         ?  Smoking status:  Former Smoker     ?  Smokeless tobacco:  Never Used       Substance Use Topics         ?  Alcohol use:  Not Currently              Alcohol/week:  0.0 standard drinks             Comment: rare         ?  Drug use:  No             Allergies        Allergen  Reactions         ?  Heparin Analogues  Shortness of Breath and Angioedema             Family History         Problem  Relation  Age of Onset          ?  Diabetes  Mother       ?  Cancer  Mother  breast           ?  Heart Disease  Mother       ?  Cancer  Father                lung          ?  Diabetes  Father       ?  Stroke  Father       ?  Diabetes  Sister       ?  Hypertension  Sister       ?  Osteoporosis  Sister       ?  Cancer  Sister                cervical cancer          ?  Heart Failure  Sister       ?  Hypertension  Sister       ?  Kidney Disease  Brother                had transplant          ?  Hypertension  Brother       ?  Diabetes  Brother       ?  Hypertension  Sister       ?  Diabetes  Brother       ?  Stroke  Brother       ?  Hypertension  Brother       ?  Heart Attack  Brother       ?  Heart Attack  Brother            ?  Hypertension  Brother               Current Outpatient Medications          Medication  Sig  Dispense  Refill           ?  aspirin 81 mg chewable tablet  Take 81 mg by mouth daily.         ?  tamsulosin (FLOMAX) 0.4 mg capsule  Take 1 Cap by mouth daily (after dinner).  90 Cap  3     ?  ENALAPRIL MALEATE PO  Take  by mouth. Pt takes, but does not know dosage         ?  atenolol (TENORMIN) 25 mg tablet  Take 1 Tab by mouth daily.  30 Tab  3     ?  nitroglycerin (NITROSTAT) 0.4 mg SL tablet  1 Tab by SubLINGual route every five (5) minutes as needed for Chest Pain.  1 Bottle  prn           ?  hydroCHLOROthiazide (HYDRODIURIL) 25 mg tablet  Take 1 Tab by mouth daily.  30 Tab  3           Review of Systems   Constitutional: Fever: No   Skin: Rash: No   HEENT: Hearing difficulty: No   Eyes: Blurred vision: No   Cardiovascular: Chest pain: Yes   Respiratory: Shortness of breath: Yes   Gastrointestinal: Nausea/vomiting: No   Musculoskeletal: Back pain: Yes   Bil   Neurological: Weakness: No   Psychological: Memory loss: No   Comments/additional findings:             PHYSICAL EXAMINATION:    Visit Vitals      BP  137/84     Ht  6'  3" (1.905 m)     Wt  275 lb (124.7 kg)        BMI  34.37 kg/m??        Constitutional: WDWN, Pleasant and appropriate affect, No acute distress.     CV:  No  peripheral swelling noted   Respiratory: No respiratory distress or difficulties   Abdomen:  No abdominal masses or tenderness. No CVA tenderness.    GU Male:     SCROTUM:  No scrotal rash or lesions noticed.  Normal bilateral testes and epididymis.    PENIS: Urethral meatus normal in location and size. No urethral discharge.   Skin: No jaundice.     Neuro/Psych:  Alert and oriented x 3, affect appropriate.    Lymphatic:   No enlarged supraclavicular lymph nodes.           REVIEW OF LABS AND IMAGING:       Results for orders placed or performed in visit on 06/11/18     AMB POC URINALYSIS DIP STICK AUTO W/O MICRO         Result  Value  Ref Range            Color (UA POC)  Yellow         Clarity (UA POC)  Clear         Glucose (UA POC)  Negative  Negative       Bilirubin (UA POC)  Negative  Negative       Ketones (UA POC)  Negative  Negative       Specific gravity (UA POC)  1.020  1.001 - 1.035       Blood (UA POC)  Negative  Negative       pH (UA POC)  7.5  4.6 - 8.0       Protein (UA POC)  Negative  Negative       Urobilinogen (UA POC)  0.2 mg/dL  0.2 - 1       Nitrites (UA POC)  Negative  Negative            Leukocyte esterase (UA POC)  Negative  Negative                       A copy of today's office visit with all pertinent imaging results and labs were sent to the referring physician.        CC: Lonia Mad, [place]       Vonna Kotyk P. Kandyce Rud, MD    [facility] [place] [address]      Medical documentation provided with the assistance of Aileen Fass, medical scribe for Lenord Fellers, MD.      Patient's BMI is out of the normal parameters.  Information about BMI was given and patient was advised to follow-up with their PCP for further management.

## 2018-06-14 NOTE — Progress Notes (Signed)
Jordan Weiss has order for CT      To be done at [facility] by ASAP    Patient has a follow-up appointment: TBD    Best number to contact patient:  [phone] OR [phone]        Philip Aspen

## 2018-06-14 NOTE — Progress Notes (Signed)
Faxed imaging to [facility]; [place] [phone]; CT ABD PELV; ASAP

## 2018-06-14 NOTE — Progress Notes (Signed)
Nathanal Lafler has order for CT      To be done at [facility]    Needed by ASAP    Patient has a follow-up appointment: TBD    Best number to contact patient:  [phone] OR [phone]        Corey Harold

## 2018-06-22 NOTE — Progress Notes (Signed)
Appointment Information   Name: Jordan Weiss, Jordan Weiss MRN: [id number]   Date: 06/30/2018 Status: Sch   Time: 6:00 PM Length: 60   Visit Type: CT ABDOMEN,PELVIS [47425] Visit # or HBOC HAR: [id number]   ?? ?? Epic HAR: [id number]   Prov/Res: [facility] CT Department: [facility] CAT SCAN   Ordering Provider: Lenord Fellers [ [phone]

## 2018-06-22 NOTE — Progress Notes (Signed)
 Appointment Information   Name: Candon, Caras MRN: [id number]   Date: 06/30/2018 Status: Sch   Time: 6:00 PM Length: 60   Visit Type: CT ABDOMEN,PELVIS [15209] Visit # or HBOC HAR: [id number]     Epic HAR: [id number]   Prov/Res: [facility] CT Department: [facility] CAT SCAN   Ordering Provider: HONORE FONDA SQUIBB [ [phone]

## 2018-07-02 NOTE — Telephone Encounter (Signed)
Pt knows I am sending the results to Dr. Kandyce Rud to review. Florencia Reasons, LPN

## 2018-07-02 NOTE — Telephone Encounter (Signed)
Pt called in to get ct results. Also per last office note Dr. Kandyce Rud wanted the pt to f/u with Dr. Jimmye Norman, but there was no referral in chart. Pls advise. Thanks

## 2018-07-21 ENCOUNTER — Encounter: Payer: PRIVATE HEALTH INSURANCE | Attending: Urology | Primary: Physician Assistant

## 2018-07-21 NOTE — Progress Notes (Unsigned)
Patient did not show for appointment - please reschedule.

## 2018-07-21 NOTE — Progress Notes (Deleted)
UROLOGIC ONCOLOGY RENAL MASS FOLLOW UP NOTE    Jordan Weiss  07/21/2018    ASSESSMENT:     1. 1.4 cm left renal lesion diagnosed 03/2015   -Location:  [facility]   -ECOG PS:  0   -Creatinine:  0.6 (05/2014) with eGFR >60.0   -plan was for patient to be scheduled for left partial nephrectomy. Unfortunately, pt lost insurance and lost to follow up. Left renal mass measured 1.5 cm at that time (CT A/P 10/03/14)    Current Disease Status: 1.9 left mid pole lesion  Current Disease Plan: To OR for left open partial nephrectomy     2. H/o Gastric Bypass   -internal hernia with emergent repair in 1/15    Last Imaging:     1/15 CT AP w cont bilateral renal cysts, the largest on the right 3 cm.  8/15 CT AP w wo cont-3 cm right upper pole renal cyst, 1.4 cm hypodensity medial aspect of the mid pole left kidney, ultrasound suggested for further characterization.  8/15 RUS- 2.9 cm simple cyst upper pole right kidney.1.5 cm septated cyst upper pole right kidney. 1.4 cm lesion midpole left kidney.  4/16 CT AP w wo cont- 3 and 1.6 cm right renal cysts and 1.6 cm left midpole lesion. No significant change in the size or enhancement of the kidney lesion.  5/19 CT C/A - no mets in chest and abdomen  1/20 CT AP - Indeterminant 1.9 cm lesion arising along the posterior aspect of the left kidney with imaging features which are concerning for neoplasm.   PLAN:     -Reviewed recent imaging of CT AP 1/20  -RTC in *** months with CBC, BMP, CXR and *** prior    Patient's BMI is out of the normal parameters.  Information about BMI was given to the patient.       REFERRALS:     -Repeat Biopsy: {YES/NO (Default):22287}  -Med/Cardiology : {YES/NO (Default):22287}  -Med Oncology Consult: {YES/NO (Default):22287}      SUBJECTIVE:     No chief complaint on file.      HPI: Jordan Weiss is a 48 y.o. male who was diagnosed with a {left right:21875} renal mass and elected active surveillance due to ***.   Initial imaging study of *** demonstrated a ***. ***    ECOG PS: ***    Dx DATE: ***     Review of Systems  Constitutional: Fever:   Skin: Rash:   HEENT: Hearing difficulty:   Eyes: Blurred vision:   Cardiovascular: Chest pain:   Respiratory: Shortness of breath:   Gastrointestinal: Nausea/vomiting:   Musculoskeletal: Back pain:   Neurological: Weakness:   Psychological: Memory loss:   Comments/additional findings:       Past Medical History:   Diagnosis Date   ??? CAD (coronary artery disease)    ??? Cancer ([place])     kidney   ??? DDD (degenerative disc disease), lumbar    ??? Diabetes ([place])    ??? Hypertension    ??? OA (osteoarthritis) of knee    ??? Renal lesion        Past Surgical History:   Procedure Laterality Date   ??? HX GASTRIC BYPASS  2005   ??? HX HERNIA REPAIR  2015   ??? HX ORTHOPAEDIC  2013    fractured back   ??? HX OTHER SURGICAL Bilateral 2012, 2014    Rotator cuff   ??? HX OTHER SURGICAL Left 1989    degloved injury   ???  HX SHOULDER ARTHROSCOPY      rotator cuff repair x 2       Allergies   Allergen Reactions   ??? Heparin Analogues Shortness of Breath and Angioedema       Current Outpatient Medications   Medication Sig   ??? aspirin 81 mg chewable tablet Take 81 mg by mouth daily.   ??? tamsulosin (FLOMAX) 0.4 mg capsule Take 1 Cap by mouth daily (after dinner).   ??? ENALAPRIL MALEATE PO Take  by mouth. Pt takes, but does not know dosage   ??? atenolol (TENORMIN) 25 mg tablet Take 1 Tab by mouth daily.   ??? nitroglycerin (NITROSTAT) 0.4 mg SL tablet 1 Tab by SubLINGual route every five (5) minutes as needed for Chest Pain.   ??? hydroCHLOROthiazide (HYDRODIURIL) 25 mg tablet Take 1 Tab by mouth daily.     No current facility-administered medications for this visit.        Family History   Problem Relation Age of Onset   ??? Diabetes Mother    ??? Cancer Mother         breast   ??? Heart Disease Mother    ??? Cancer Father         lung   ??? Diabetes Father    ??? Stroke Father    ??? Diabetes Sister    ??? Hypertension Sister     ??? Osteoporosis Sister    ??? Cancer Sister         cervical cancer   ??? Heart Failure Sister    ??? Hypertension Sister    ??? Kidney Disease Brother         had transplant   ??? Hypertension Brother    ??? Diabetes Brother    ??? Hypertension Sister    ??? Diabetes Brother    ??? Stroke Brother    ??? Hypertension Brother    ??? Heart Attack Brother    ??? Heart Attack Brother    ??? Hypertension Brother      I have reviewed the family history and there are no changes at this time.    Social History     Socioeconomic History   ??? Marital status: MARRIED     Spouse name: Not on file   ??? Number of children: Not on file   ??? Years of education: Not on file   ??? Highest education level: Not on file   Tobacco Use   ??? Smoking status: Former Smoker   ??? Smokeless tobacco: Never Used   Substance and Sexual Activity   ??? Alcohol use: Not Currently     Alcohol/week: 0.0 standard drinks     Comment: rare   ??? Drug use: No   ??? Sexual activity: Yes     Partners: Female         OBJECTIVE:   Physical Exam:  There were no vitals filed for this visit.    Gen: Patient is in NAD   Pulm: Equal respiratory effort bilaterally   CV: regular rate   Abd: soft, nontender, non-distended   Ext: No clubbing, cyanosis or edema   Neuro: Grossly intact   Skin: warm and dry       LABS/ IMAGING:     Results for orders placed or performed in visit on 06/11/18   AMB POC URINALYSIS DIP STICK AUTO W/O MICRO   Result Value Ref Range    Color (UA POC) Yellow     Clarity (UA POC) Clear  Glucose (UA POC) Negative Negative    Bilirubin (UA POC) Negative Negative    Ketones (UA POC) Negative Negative    Specific gravity (UA POC) 1.020 1.001 - 1.035    Blood (UA POC) Negative Negative    pH (UA POC) 7.5 4.6 - 8.0    Protein (UA POC) Negative Negative    Urobilinogen (UA POC) 0.2 mg/dL 0.2 - 1    Nitrites (UA POC) Negative Negative    Leukocyte esterase (UA POC) Negative Negative     06/30/18 CT AP w wo Cont  FINDINGS:  ??   LIVER, BILIARY: There is mild hypertrophy of the left hepatic lobe. The periphery of the liver appears to be somewhat nodular suggesting a cirrhotic hepatic morphology. No focal hepatic lesions are identified. The gallbladder is partially contracted with mild, nonspecific gallbladder wall thickening. No biliary dilation.  ??  PANCREAS: WNL.  ??  SPLEEN: WNL.  ??  ADRENALS: Unremarkable.  ??  KIDNEYS: There is a 3.8 cm simple appearing cyst arising from the ventral aspect of the mid-upper pole the right kidney. A probable subcentimeter cyst is also noted in the upper pole the right kidney. There is a 1.9 cm vague rounded area of relative nonenhancement in the posterior aspect of the interpolar region of the left kidney, best seen on the delayed phase (image 80, series 9). Minimal contour irregularity of the kidney is noted in this region. On the noncontrast portion of the evaluation, this lesion demonstrates Hounsfield unit measurements of approximately 43, increasing to approximately 64 during the arterial phase and slightly decreasing on the delayed phase to 57 HU. These imaging features are suspicious for neoplasm. A subcentimeter focus of relative nonenhancement is also noted more inferiorly within the left kidney (image 86) and is too small to definitively characterize but likely represents a cyst.  ??  There is no evidence of urolithiasis or hydronephrosis.  ??  LYMPH NODES: WNL.  ??  PERITONEAL CAVITY AND GASTROINTESTINAL TRACT: No free air or free fluid. ??No bowel dilation or wall thickening. Postsurgical changes are noted status post gastric bypass.  ??  VASCULATURE: Unremarkable.  ??  LOWER CHEST: Unremarkable.  ??  PELVIC VISCERA: Evaluation of the pelvis demonstrates a normal-appearing urinary bladder. No abnormal intrapelvic masses or fluid collections are identified.  ??  BONES: No acute or aggressive osseous abnormalities are identified. There  appears to be partial ankylosis of the left sacroiliac joint. Degenerative changes are noted throughout the thoracolumbar spine.  ??  OTHER: None.  _______________  ??  IMPRESSION  ??  1. Indeterminant 1.9 cm lesion arising along the posterior aspect of the left kidney with imaging features which are concerning for neoplasm. In the absence of histologic correlation, MRI without and with contrast may be helpful for further characterization.  ??  2. Probable cirrhotic hepatic morphology.  ??  3. Additional findings, as noted above.  ??        Legrand Como B. Jimmye Norman, MD, [place]   Urologic Oncology  [facility] [place]    Associate Professor of Urology  Department of Urology  [facility], [place], [place]     Pager:  [phone]  Office:  [phone]       {No Diagnosis Found}    A copy of today's office visit with all pertinent imaging results and labs were sent to Lonia Mad, [place] for review.    Documentation provided by Harriet Pho, medical scribe for  Vasilia Dise on 07/21/2018

## 2019-07-13 ENCOUNTER — Ambulatory Visit: Attending: Urology | Primary: Physician Assistant

## 2019-07-13 ENCOUNTER — Ambulatory Visit: Admit: 2019-07-13 | Discharge: 2019-07-13 | Attending: Urology | Primary: Physician Assistant

## 2019-07-13 ENCOUNTER — Encounter: Payer: PRIVATE HEALTH INSURANCE | Attending: Urology | Primary: Physician Assistant

## 2019-07-13 DIAGNOSIS — N2889 Other specified disorders of kidney and ureter: Secondary | ICD-10-CM

## 2019-07-13 LAB — AMB POC URINALYSIS DIP STICK AUTO W/O MICRO
Bilirubin (UA POC): NEGATIVE
Bilirubin, Urine, POC: NEGATIVE
Blood (UA POC): NEGATIVE
Blood (UA POC): NEGATIVE
Glucose (UA POC): NEGATIVE
Glucose, Urine, POC: NEGATIVE
Ketones (UA POC): NEGATIVE
Ketones, Urine, POC: NEGATIVE
Leukocyte Esterase, Urine, POC: NEGATIVE
Leukocyte esterase (UA POC): NEGATIVE
Nitrite, Urine, POC: NEGATIVE
Nitrites (UA POC): NEGATIVE
Protein (UA POC): NEGATIVE
Protein, Urine, POC: NEGATIVE
Specific Gravity, Urine, POC: 1.02 NA (ref 1.001–1.035)
Specific gravity (UA POC): 1.02 (ref 1.001–1.035)
Urobilinogen (UA POC): 1 (ref 0.2–1)
Urobilinogen, POC: 1 (ref 0.2–1)
pH (UA POC): 7 (ref 4.6–8.0)
pH, Urine, POC: 7 NA (ref 4.6–8.0)

## 2019-07-13 NOTE — Progress Notes (Signed)
Jordan Weiss has order for CT ABD PELV W WO CONT    To be done at [facility] by:  Follow-up appointment.     Patient has a follow-up appointment:  Yes  : August 24, 2019    If MRI, does patient have a pacemaker:   No    Order has been placed in connect care:  Yes    Is this a STAT order:  No      Shayla Silver

## 2019-07-13 NOTE — Progress Notes (Signed)
UROLOGIC ONCOLOGY RENAL MASS FOLLOW UP NOTE    Jordan Weiss  07/13/2019    ASSESSMENT:     1. 1.4 cm left renal lesion seen on CT AP 8/15   -Originally planning for left open partial nephrectomy in 5/16. Patient lost to follow up 2/2 losing health insurance. Last seen by Dr. Kandyce Weiss on 06/11/18.   -Location:  [facility]   -ECOG PS:  0   -Creatinine:  0.8 (1/20)   -Current size:  1.9 cm (CT AP 1/20)    Current Disease Status: Small left renal mass concerning for malignancy  Current Disease Plan: Repeat imaging now; pending treatment decision    2. BPH with LUTS- urinary frequency    3. H/o Gastric Bypass   -internal hernia with emergent repair in 1/15    4. 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 Heart issues, utilizes NTG on occasion    Last Imaging:  1/15 CT AP w cont- bilateral renal cysts, the largest on the right 3 cm.  8/15 CT AP w wo cont- 3 cm right upper pole renal cyst, 1.4 cm hypodensity medial aspect of the mid pole left kidney, ultrasound suggested for further characterization.  8/15 RUS- 2.9 cm simple cyst upper pole right kidney.1.5 cm septated cyst upper pole right kidney. 1.4 cm lesion midpole left kidney.  4/16 CT AP w wo cont- 3 and 1.6 cm right renal cysts and 1.6 cm left midpole lesion. No significant change in the size or enhancement of the kidney lesion.  1/20 CT AP w wo Cont-  1.9 cm lesion arising along the posterior aspect of the left kidney.    PLAN:     -Patient has been lost to follow up since 5/16 2/2 loss of health insurance. Was seen by Dr. Kandyce Weiss in 12/19.  -Reviewed CT AP from 1/20 revealing 1.9cm left kidney lesion suspicious for malignancy. Overall, lesion is relatively stable in size compared to 1.4cm on CT in 8/15.  -Mr. Jordan Weiss and I discussed the fact that 80% of small renal masses are found to be malignant and that the primary treatment option is surgical excision via either open or laparoscopic approach. Other treatment options discussed include  IR-guided bx with consideration for ablative therapies (cryoablation v radiofrequency ablation) and active surveillance. All risks, benefits, and post-op expectations were reviewed with the patient in detail. Given his multiple co-morbidities including significant back issues with h/o surgery on chronic pain management, and slow growth of the lesion since 2015, strong consideration could be made for laparoscopic surgical excision or ablative therapies.  -Discussed that it is unlikely that the patient's flank pain is 2/2 his small renal mass.  -Discussed causes of urinary frequency include BPH, obesity, and DM. Recommend weight loss and close control of blood sugars.  -RTC in 6 weeks with CBC, BMP, CT renal protocol prior and to finalize treatment decision.  -Follow up with cardiology next week and GI as scheduled.    Patient's BMI is out of the normal parameters.  Information about BMI was given to the patient.       SUBJECTIVE:     Chief Complaint   Patient presents with   ??? Renal Mass       HPI: Jordan Weiss is a 49 y.o. male who was diagnosed with a 1.4cm left renal mass and elected active surveillance due to being lost to follow up.  Most recent CT demonstrates mass is relatively stable at 1.9cm. Patient presents today  doing well overall. Reports chronic left flank pain and back pain. Has undergone multiple back surgeries. Also reports bothersome urinary frequency. He is not currently on GU meds.    Patient has been having some chest pains, utilizes NTG prn.    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 CAD (coronary artery disease)    ??? Cancer ([place])     kidney   ??? DDD (degenerative disc disease), lumbar    ??? Diabetes ([place])    ??? Hypertension    ??? OA (osteoarthritis) of knee    ??? Renal lesion        Past Surgical History:   Procedure Laterality Date   ??? HX GASTRIC BYPASS  2005   ??? HX HERNIA REPAIR  2015   ??? HX ORTHOPAEDIC  2013    fractured back   ??? HX OTHER SURGICAL Bilateral 2012, 2014    Rotator cuff   ??? HX OTHER SURGICAL Left 1989    degloved injury   ??? HX SHOULDER ARTHROSCOPY      rotator cuff repair x 2       Allergies   Allergen Reactions   ??? Heparin Analogues Shortness of Breath and Angioedema       Current Outpatient Medications   Medication Sig   ??? dilTIAZem ER (CARDIZEM CD) 120 mg capsule TAKE 1 CAPSULE BY MOUTH ONCE DAILY   ??? fentaNYL (DURAGESIC) 25 mcg/hr PATCH APPLY 1 PATCH TOPICALLY EVERY 3 DAYS   ??? HYDROcodone-acetaminophen (NORCO) 7.5-325 mg per tablet Take 1 Tab by mouth three (3) times daily as needed.   ??? baclofen (LIORESAL) 10 mg tablet TAKE 1 TABLET BY MOUTH TWICE A DAY AS NEEDED   ??? aspirin 81 mg chewable tablet Take 81 mg by mouth daily.   ??? tamsulosin (FLOMAX) 0.4 mg capsule Take 1 Cap by mouth daily (after dinner).   ??? ENALAPRIL MALEATE PO Take  by mouth. Pt takes, but does not know dosage   ??? atenolol (TENORMIN) 25 mg tablet Take 1 Tab by mouth daily.   ??? nitroglycerin (NITROSTAT) 0.4 mg SL tablet 1 Tab by SubLINGual route every five (5) minutes as needed for Chest Pain.   ??? hydroCHLOROthiazide (HYDRODIURIL) 25 mg tablet Take 1 Tab by mouth daily.     No current facility-administered medications for this visit.        Family History   Problem Relation Age of Onset   ??? Diabetes Mother    ??? Cancer Mother         breast   ??? Heart Disease Mother    ??? Cancer Father         lung   ??? Diabetes Father    ??? Stroke Father    ??? Diabetes Sister    ??? Hypertension Sister    ??? Osteoporosis Sister    ??? Cancer Sister         cervical cancer   ??? Heart Failure Sister    ???  Hypertension Sister    ???  Kidney Disease Brother         had transplant   ??? Hypertension Brother    ??? Diabetes Brother    ??? Hypertension Sister    ??? Diabetes Brother    ??? Stroke Brother    ??? Hypertension Brother    ??? Heart Attack Brother    ??? Heart Attack Brother    ??? Hypertension Brother      I have reviewed the family history and there are no changes at this time.    Social History     Socioeconomic History   ??? Marital status: MARRIED     Spouse name: Not on file   ??? Number of children: Not on file   ??? Years of education: Not on file   ??? Highest education level: Not on file   Tobacco Use   ??? Smoking status: Former Smoker   ??? Smokeless tobacco: Never Used   Substance and Sexual Activity   ??? Alcohol use: Not Currently     Alcohol/week: 0.0 standard drinks     Comment: rare   ??? Drug use: No   ??? Sexual activity: Yes     Partners: Female         OBJECTIVE:     Physical Exam:  Vitals:    07/13/19 1514   Resp: 18   Weight: 275 lb (124.7 kg)   Height: 6\' 3"  (1.905 m)       Gen: Patient is in NAD, morbid obesity  Pulm: no shortness of breath  CV: regular rate   Abd: non-distended   Ext: BLEE  Neuro: Grossly intact   Skin: warm and dry       LABS/ IMAGING:     Results for orders placed or performed in visit on 07/13/19   AMB POC URINALYSIS DIP STICK AUTO W/O MICRO   Result Value Ref Range    Color (UA POC) Yellow     Clarity (UA POC) Clear     Glucose (UA POC) Negative Negative    Bilirubin (UA POC) Negative Negative    Ketones (UA POC) Negative Negative    Specific gravity (UA POC) 1.020 1.001 - 1.035    Blood (UA POC) Negative Negative    pH (UA POC) 7.0 4.6 - 8.0    Protein (UA POC) Negative Negative    Urobilinogen (UA POC) 1 mg/dL 0.2 - 1    Nitrites (UA POC) Negative Negative    Leukocyte esterase (UA POC) Negative Negative     CT AP w wo Cont 1/20  FINDINGS:   ??   LIVER, BILIARY: There is mild hypertrophy of the left hepatic lobe. The periphery of the liver appears to be somewhat nodular suggesting a cirrhotic hepatic  morphology. No focal hepatic lesions are identified. The gallbladder is partially contracted with mild, nonspecific gallbladder wall thickening. No biliary dilation.   ??   PANCREAS: WNL.   ??   SPLEEN: WNL.   ??   ADRENALS: Unremarkable.   ??   KIDNEYS: There is a 3.8 cm simple appearing cyst arising from the ventral aspect of the mid-upper pole the right kidney. A probable subcentimeter cyst is also noted in the upper pole the right kidney. There is a 1.9 cm vague rounded area of relative nonenhancement in the posterior aspect of the interpolar region of the left kidney, best seen on the delayed phase (image 80, series 9). Minimal contour irregularity of the kidney is noted in this region. On the noncontrast portion  of the evaluation, this lesion demonstrates Hounsfield unit measurements of approximately 43, increasing to approximately 64 during the arterial phase and slightly decreasing on the delayed phase to 57 HU. These imaging features are suspicious for neoplasm. A subcentimeter focus of relative nonenhancement is also noted more inferiorly within the left kidney (image 86) and is too small to definitively characterize but likely represents a cyst.   ??   There is no evidence of urolithiasis or hydronephrosis.   ??   LYMPH NODES: WNL.   ??   PERITONEAL CAVITY AND GASTROINTESTINAL TRACT: No free air or free fluid. ??No bowel dilation or wall thickening. Postsurgical changes are noted status post gastric bypass.   ??   VASCULATURE: Unremarkable.   ??   LOWER CHEST: Unremarkable.   ??   PELVIC VISCERA: Evaluation of the pelvis demonstrates a normal-appearing urinary bladder. No abnormal intrapelvic masses or fluid collections are identified.   ??   BONES: No acute or aggressive osseous abnormalities are identified. 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 Degenerative changes are noted throughout the thoracolumbar spine.   ??   OTHER: None.   _______________   ??   IMPRESSION   ??   1. Indeterminant  1.9 cm lesion arising along the posterior aspect of the left kidney with imaging features which are concerning for neoplasm. In the absence of histologic correlation, MRI without and with contrast may be helpful for further characterization.   ??   2. Probable cirrhotic hepatic morphology.   ??   3. Additional findings, as noted above.             Christena Deem. Jimmye Norman, MD, [place]   Urologic Oncology  [facility] [place]    Associate Professor of Urology  Department of Urology  [facility], [place]     Pager:  [phone]  Office:  [phone]         ICD-10-CM ICD-9-CM    1. Renal mass  N28.89 593.9 AMB POC URINALYSIS DIP STICK AUTO W/O MICRO      CT ABD PELV W WO CONT      CBC W/O DIFF      METABOLIC PANEL, COMPREHENSIVE   2. BPH with obstruction/lower urinary tract symptoms  N40.1 600.01     N13.8 599.69        A copy of today's office visit with all pertinent imaging results and labs were sent to Lonia Mad, [place] for review.    Medical documentation provided by Leta Speller, medical scribe for Renford Dills, MD on 07/13/2019

## 2019-07-13 NOTE — Progress Notes (Signed)
Faxed imaging to [facility]; [place] [phone]; ct ap; 08/24/19

## 2019-07-13 NOTE — Progress Notes (Signed)
Jordan Weiss has order for CT ABD PELV W WO CONT    To be done at [facility] by:  Follow-up appointment.     Patient has a follow-up appointment:  Yes  : August 24, 2019    If MRI, does patient have a pacemaker:   No    Order has been placed in connect care:  Yes    Is this a STAT order:  No      Shayla Silver

## 2019-07-13 NOTE — Progress Notes (Signed)
Faxed imaging to [facility]; [place] [phone]; ct ap; 08/24/19

## 2019-07-13 NOTE — Progress Notes (Signed)
Progress Notes by Renford Dills, MD at 07/13/19 1500                Author: Renford Dills, MD  Service: --  Author Type: Physician       Filed: 07/17/19 1145  Encounter Date: 07/13/2019  Status: Signed          Editor: Renford Dills, MD (Physician)                          UROLOGIC ONCOLOGY RENAL MASS FOLLOW UP NOTE      Jordan Weiss   07/13/2019        ASSESSMENT:        1. 1.4 cm left renal lesion seen on CT AP 8/15    -Originally planning for left open partial nephrectomy in 5/16. Patient lost to follow up 2/2 losing health insurance. Last seen by Dr. Kandyce Weiss on 06/11/18.    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 BPH with LUTS- urinary frequency      3. H/o Gastric Bypass    -internal hernia with emergent repair in 1/15      4. 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 Heart issues, utilizes NTG on occasion      Last Imaging:   1/15 CT AP w cont- bilateral renal cysts, the largest on the right 3 cm.   8/15 CT AP w wo cont- 3 cm right upper pole renal cyst, 1.4 cm hypodensity medial aspect of the mid pole left kidney, ultrasound suggested for further characterization.   8/15 RUS- 2.9 cm simple cyst upper pole right kidney.1.5 cm septated cyst upper pole right kidney. 1.4 cm lesion midpole left kidney.   4/16 CT AP w wo cont- 3 and 1.6 cm right renal cysts and 1.6 cm left midpole lesion. No significant change in the size or enhancement of the kidney lesion.   1/20 CT AP w wo Cont-  1.9 cm lesion arising along the posterior aspect of the left kidney.        PLAN:        -Patient has been lost to follow up since 5/16 2/2 loss of health insurance. Was seen by Dr. Kandyce Weiss in 12/19.   -Reviewed CT AP from 1/20 revealing 1.9cm left kidney lesion suspicious for  malignancy. Overall, lesion is relatively stable in size compared to 1.4cm on CT in 8/15.   -Mr. Jordan Weiss and I discussed the fact that 80% of small renal masses are found to be malignant and that the primary treatment option is  surgical excision via either open or laparoscopic approach. Other treatment options discussed include IR-guided bx with consideration for ablative therapies (cryoablation v radiofrequency ablation) and active surveillance. All risks, benefits, and post-op  expectations were reviewed with the patient in detail. Given his multiple co-morbidities including significant back issues with h/o surgery on chronic pain management, and slow growth of the lesion since 2015, strong consideration could be made for laparoscopic  surgical excision or ablative therapies.   -Discussed that it is unlikely that the patient's flank pain is 2/2 his small renal  mass.   -Discussed causes of urinary frequency include BPH, obesity, and DM. Recommend weight loss and close control of blood sugars.   -RTC in 6 weeks with CBC, BMP, CT renal protocol prior and to finalize treatment decision.   -Follow up with cardiology next week and GI as scheduled.      Patient's BMI is out of the normal parameters.  Information about BMI was given to the patient.            SUBJECTIVE:          Chief Complaint       Patient presents with        ?  Renal Mass           HPI: Jordan Weiss is a  49 y.o. male who was diagnosed with a 1.4cm left renal mass and elected active surveillance  due to being lost to follow up.  Most recent CT demonstrates mass is relatively stable at 1.9cm. Patient presents today doing well overall. Reports chronic left flank pain and back pain. Has undergone multiple back surgeries. Also reports bothersome urinary  frequency. He is not currently on GU meds.      Patient has been having some chest pains, utilizes NTG prn.      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  CAD (coronary artery disease)       ?  Cancer [facility])            kidney         ?  DDD (degenerative disc disease), lumbar       ?  Diabetes ([place])       ?  Hypertension       ?  OA (osteoarthritis) of knee           ?  Renal lesion               Past Surgical History:         Procedure  Laterality  Date          ?  HX GASTRIC BYPASS    2005     ?  HX HERNIA REPAIR    2015     ?  HX ORTHOPAEDIC    2013          fractured back          ?  HX OTHER SURGICAL  Bilateral  2012, 2014          Rotator cuff          ?  HX OTHER SURGICAL  Left  1989          degloved injury          ?  HX SHOULDER ARTHROSCOPY  rotator cuff repair x 2             Allergies        Allergen  Reactions         ?  Heparin Analogues  Shortness of Breath and Angioedema             Current Outpatient Medications        Medication  Sig         ?  dilTIAZem ER (CARDIZEM CD) 120 mg capsule  TAKE 1 CAPSULE BY MOUTH ONCE DAILY     ?  fentaNYL (DURAGESIC) 25 mcg/hr PATCH  APPLY 1 PATCH TOPICALLY EVERY 3 DAYS     ?  HYDROcodone-acetaminophen (NORCO) 7.5-325 mg per tablet  Take 1 Tab by mouth three (3) times daily as needed.     ?  baclofen (LIORESAL) 10 mg tablet  TAKE 1 TABLET BY MOUTH TWICE A DAY AS NEEDED     ?  aspirin 81 mg chewable tablet  Take 81 mg by mouth daily.     ?  tamsulosin (FLOMAX) 0.4 mg capsule  Take 1 Cap by mouth daily (after dinner).     ?  ENALAPRIL MALEATE PO  Take  by mouth. Pt takes, but does not know dosage     ?  atenolol (TENORMIN) 25 mg tablet  Take 1 Tab by mouth daily.     ?   nitroglycerin (NITROSTAT) 0.4 mg SL tablet  1 Tab by SubLINGual route every five (5) minutes as needed for Chest Pain.         ?  hydroCHLOROthiazide (HYDRODIURIL) 25 mg tablet  Take 1 Tab by mouth daily.          No current facility-administered medications for this visit.              Family History         Problem  Relation  Age of Onset          ?  Diabetes  Mother       ?  Cancer  Mother                breast          ?  Heart Disease  Mother       ?  Cancer  Father                lung          ?  Diabetes  Father       ?  Stroke  Father       ?  Diabetes  Sister       ?  Hypertension  Sister       ?  Osteoporosis  Sister       ?  Cancer  Sister                cervical cancer          ?  Heart Failure  Sister       ?  Hypertension  Sister       ?  Kidney Disease  Brother                had transplant          ?  Hypertension  Brother       ?  Diabetes  Brother       ?  Hypertension  Sister       ?  Diabetes  Brother       ?  Stroke  Brother       ?  Hypertension  Brother       ?  Heart Attack  Brother       ?  Heart Attack  Brother            ?  Hypertension  Brother          I have reviewed the family history and there are no changes at this time.        Social History          Socioeconomic History         ?  Marital status:  MARRIED              Spouse name:  Not on file         ?  Number of children:  Not on file     ?  Years of education:  Not on file     ?  Highest education level:  Not on file       Tobacco Use         ?  Smoking status:  Former Smoker     ?  Smokeless tobacco:  Never Used       Substance and Sexual Activity         ?  Alcohol use:  Not Currently              Alcohol/week:  0.0 standard drinks             Comment: rare         ?  Drug use:  No     ?  Sexual activity:  Yes              Partners:  Female                OBJECTIVE:        Physical Exam:     Vitals:          07/13/19 1514        Resp:  18     Weight:  275 lb (124.7 kg)        Height:  6\' 3"  (1.905 m)           Gen: Patient is  in NAD, morbid obesity   Pulm: no shortness of breath   CV: regular rate    Abd: non-distended    Ext: BLEE   Neuro: Grossly intact    Skin: warm and dry            LABS/ IMAGING:          Results for orders placed or performed in visit on 07/13/19     AMB POC URINALYSIS DIP STICK AUTO W/O MICRO         Result  Value  Ref Range            Color (UA POC)  Yellow         Clarity (UA POC)  Clear         Glucose (UA POC)  Negative  Negative       Bilirubin (UA POC)  Negative  Negative       Ketones (UA POC)  Negative  Negative       Specific gravity (UA POC)  1.020  1.001 - 1.035       Blood (UA POC)  Negative  Negative  pH (UA POC)  7.0  4.6 - 8.0       Protein (UA POC)  Negative  Negative       Urobilinogen (UA POC)  1 mg/dL  0.2 - 1       Nitrites (UA POC)  Negative  Negative            Leukocyte esterase (UA POC)  Negative  Negative        CT AP w wo Cont 1/20   FINDINGS:   ??   LIVER, BILIARY: There is mild hypertrophy of the left hepatic lobe. The periphery of the liver appears to be somewhat nodular suggesting a cirrhotic  hepatic morphology. No focal hepatic lesions are identified. The gallbladder is partially contracted with mild, nonspecific gallbladder wall thickening. No biliary dilation.   ??   PANCREAS: WNL.   ??   SPLEEN: WNL.   ??    ADRENALS: Unremarkable.   ??   KIDNEYS: There is a 3.8 cm simple appearing cyst arising from the ventral aspect of the mid-upper pole the right kidney. A probable subcentimeter cyst is also noted in the upper pole the right kidney. There is  a 1.9 cm vague rounded area of relative nonenhancement in the posterior aspect of the interpolar region of the left kidney, best seen on the delayed phase (image 80, series 9). Minimal contour irregularity of the kidney is noted in this region. On the  noncontrast portion of the evaluation, this lesion demonstrates Hounsfield unit measurements of approximately 43, increasing to approximately 64 during the arterial phase and slightly  decreasing on the delayed phase to 57 HU. These imaging features are  suspicious for neoplasm. A subcentimeter focus of relative nonenhancement is also noted more inferiorly within the left kidney (image 86) and is too small to definitively characterize but likely represents a cyst.   ??   There is no evidence  of urolithiasis or hydronephrosis.   ??   LYMPH NODES: WNL.   ??   PERITONEAL CAVITY AND GASTROINTESTINAL TRACT: No free air or free fluid. ??No bowel dilation or wall thickening. Postsurgical changes are noted status post gastric  bypass.   ??   VASCULATURE: Unremarkable.   ??   LOWER CHEST: Unremarkable.   ??   PELVIC VISCERA: Evaluation of the pelvis demonstrates a normal-appearing urinary bladder. No abnormal intrapelvic masses or fluid collections  are identified.   ??   BONES: No acute or aggressive osseous abnormalities are identified. 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 Degenerative changes are noted throughout the thoracolumbar spine.   ??    OTHER: None.   _______________   ??   IMPRESSION   ??   1. Indeterminant 1.9 cm lesion arising along the posterior aspect of the left kidney with imaging features which are concerning for neoplasm. In the absence of histologic  correlation, MRI without and with contrast may be helpful for further characterization.   ??   2. Probable cirrhotic hepatic morphology.   ??   3. Additional findings, as noted above.                   Christena Deem. Jimmye Norman, MD, [place]    Urologic Oncology   [facility] [place]      Associate Professor of Urology   Department of Urology   [facility] [place], [place]  Pager:  [phone]   Office:  [phone]                    ICD-10-CM  ICD-9-CM             1.  Renal mass   N28.89  593.9  AMB POC URINALYSIS DIP STICK AUTO W/O MICRO                CT ABD PELV W WO CONT           CBC W/O DIFF           METABOLIC PANEL, COMPREHENSIVE           2.  BPH with  obstruction/lower urinary tract symptoms   N40.1  600.01              N13.8  599.69             A copy of today's office visit with all pertinent imaging results and labs were sent to [facility], [place]  for review.      Medical documentation provided by Leta Speller, medical scribe for Renford Dills, MD  on 07/13/2019

## 2019-07-20 NOTE — Progress Notes (Addendum)
Appointment Information   Name: Jarin, Hallquist MRN: [id number]   Date: 08/17/2019 Status: Sch   Time: 9:30 AM Length: 30   Visit Type: CT ABDOMEN,PELVIS [id number]       Cancel Rsn: Patient Refused

## 2019-07-20 NOTE — Progress Notes (Signed)
Appointment Information   Name: Jordan Weiss, Jordan Weiss MRN: [id number]   Date: 08/17/2019 Status: Sch   Time: 9:30 AM Length: 30   Visit Type: CT ABDOMEN,PELVIS [15209]       Cancel Rsn: Patient Refused

## 2019-08-13 ENCOUNTER — Encounter

## 2019-08-24 ENCOUNTER — Encounter: Attending: Urology | Primary: Physician Assistant

## 2019-09-19 ENCOUNTER — Emergency Department: Admit: 2019-09-20 | Payer: MEDICAID | Primary: Physician Assistant

## 2019-09-19 DIAGNOSIS — M79642 Pain in left hand: Secondary | ICD-10-CM

## 2019-09-19 NOTE — ED Notes (Signed)
Care continues  Pt ambulated to room 5  Pt calm, breathing easy, will continue to monitor

## 2019-09-19 NOTE — ED Provider Notes (Signed)
HPI   49 year old Caucasian male with past medical history of coronary artery disease, left renal mass, DJD of lumbar spine, schizoaffective disorder depressive type, osteoarthritis of knee presents with a chief complaint of left hand pain, and lower back pain x2 days.  Patient states that he slipped and fell on his deck while running away from his children.  He states that he fell backwards and tried to break his fall on the stairs.  Patient states that he has pain in the left ring finger.  Patient status post degloving injury 13 years ago of his left hand.  Hand was repaired after multiple revisions and skin graft.  Patient also complains of pain in his lower back.  He states he had a laminectomy several years ago secondary to degenerative disc disease.  Patient states that he is followed by Dr. Hassell Done orthopedic surgeon in [place].  He states that he took baclofen for his pain earlier today which did not help with his symptoms.  Patient currently rates his pain 10 out of 10 in intensity.  He is sitting comfortably on the stretcher reading his phone and does not appear to be in the stated level of pain.  Past Medical History:   Diagnosis Date   ??? CAD (coronary artery disease)    ??? Cancer ([place])     kidney   ??? DDD (degenerative disc disease), lumbar    ??? Diabetes ([place])    ??? Hypertension    ??? OA (osteoarthritis) of knee    ??? Renal lesion        Past Surgical History:   Procedure Laterality Date   ??? HX GASTRIC BYPASS  2005   ??? HX HERNIA REPAIR  2015   ??? HX ORTHOPAEDIC  2013    fractured back   ??? HX OTHER SURGICAL Bilateral 2012, 2014    Rotator cuff   ??? HX OTHER SURGICAL Left 1989    degloved injury   ??? HX SHOULDER ARTHROSCOPY      rotator cuff repair x 2         Family History:   Problem Relation Age of Onset   ??? Diabetes Mother    ??? Cancer Mother         breast   ??? Heart Disease Mother    ??? Cancer Father         lung   ??? Diabetes Father    ??? Stroke Father    ??? Diabetes Sister    ??? Hypertension Sister    ???  Osteoporosis Sister    ??? Cancer Sister         cervical cancer   ??? Heart Failure Sister    ??? Hypertension Sister    ??? Kidney Disease Brother         had transplant   ??? Hypertension Brother    ??? Diabetes Brother    ??? Hypertension Sister    ??? Diabetes Brother    ??? Stroke Brother    ??? Hypertension Brother    ??? Heart Attack Brother    ??? Heart Attack Brother    ??? Hypertension Brother        Social History     Socioeconomic History   ??? Marital status: MARRIED     Spouse name: Not on file   ??? Number of children: Not on file   ??? Years of education: Not on file   ??? Highest education level: Not on file   Occupational History   ???  Not on file   Social Needs   ??? Financial resource strain: Not on file   ??? Food insecurity     Worry: Not on file     Inability: Not on file   ??? Transportation needs     Medical: Not on file     Non-medical: Not on file   Tobacco Use   ??? Smoking status: Former Smoker   ??? Smokeless tobacco: Never Used   Substance and Sexual Activity   ??? Alcohol use: Not Currently     Alcohol/week: 0.0 standard drinks     Comment: rare   ??? Drug use: No   ??? Sexual activity: Yes     Partners: Female   Lifestyle   ??? Physical activity     Days per week: Not on file     Minutes per session: Not on file   ??? Stress: Not on file   Relationships   ??? Social Product manager on phone: Not on file     Gets together: Not on file     Attends religious service: Not on file     Active member of club or organization: Not on file     Attends meetings of clubs or organizations: Not on file     Relationship status: Not on file   ??? Intimate partner violence     Fear of current or ex partner: Not on file     Emotionally abused: Not on file     Physically abused: Not on file     Forced sexual activity: Not on file   Other Topics Concern   ??? Not on file   Social History Narrative   ??? Not on file         ALLERGIES: Heparin analogues    Review of Systems   Constitutional: Negative for appetite change, chills, diaphoresis, fatigue and fever.    HENT: Negative for congestion, ear pain, nosebleeds, rhinorrhea, sinus pressure and sore throat.    Eyes: Negative for pain, discharge and redness.   Respiratory: Negative for cough, choking, chest tightness, shortness of breath and wheezing.    Cardiovascular: Negative for chest pain, palpitations and leg swelling.   Gastrointestinal: Negative for abdominal pain, blood in stool, constipation, diarrhea, nausea and vomiting.   Endocrine: Negative for polyuria.   Genitourinary: Negative for decreased urine volume, difficulty urinating, dysuria, flank pain, frequency, genital sores, hematuria, scrotal swelling, testicular pain and urgency.   Musculoskeletal: Positive for back pain and joint swelling. Negative for neck pain and neck stiffness.   Skin: Negative for pallor, rash and wound.   Neurological: Negative for tremors, seizures, syncope, speech difficulty, weakness, light-headedness and headaches.   Hematological: Negative for adenopathy. Does not bruise/bleed easily.   Psychiatric/Behavioral: Negative for agitation, behavioral problems, confusion, hallucinations, self-injury, sleep disturbance and suicidal ideas. The patient is not nervous/anxious and is not hyperactive.        Vitals:    09/19/19 2052   BP: (!) 142/89   Pulse: 75   Resp: 18   Temp: 98 ??F (36.7 ??C)   SpO2: 97%   Weight: 131.5 kg (290 lb)   Height: 6\' 3"  (1.905 m)            Physical Exam  Vitals signs and nursing note reviewed.   Constitutional:       General: He is not in acute distress.     Appearance: Normal appearance. He is well-developed. He is obese. He is not  diaphoretic.   HENT:      Head: Normocephalic and atraumatic.      Right Ear: Tympanic membrane and external ear normal.      Left Ear: Tympanic membrane and external ear normal.      Nose: Nose normal.      Mouth/Throat:      Mouth: Mucous membranes are moist.      Pharynx: No oropharyngeal exudate.   Eyes:      General: No scleral icterus.        Right eye: No discharge.          Left eye: No discharge.      Extraocular Movements: Extraocular movements intact.      Conjunctiva/sclera: Conjunctivae normal.      Pupils: Pupils are equal, round, and reactive to light.   Neck:      Musculoskeletal: Normal range of motion and neck supple.      Thyroid: No thyromegaly.      Vascular: No JVD.      Trachea: No tracheal deviation.   Cardiovascular:      Rate and Rhythm: Normal rate and regular rhythm.      Heart sounds: Normal heart sounds. No murmur. No friction rub. No gallop.    Pulmonary:      Effort: Pulmonary effort is normal. No respiratory distress.      Breath sounds: Normal breath sounds. No stridor. No wheezing or rales.   Chest:      Chest wall: No tenderness.   Abdominal:      General: Bowel sounds are normal. There is no distension.      Palpations: Abdomen is soft. There is no mass.      Tenderness: There is no abdominal tenderness. There is no guarding or rebound.   Musculoskeletal: Normal range of motion.         General: Tenderness (Mild tenderness in the lumbar spine.  No step off or crepitus.  Midline surgical scar, lower lumbar spine.) present. No swelling.      Comments: Mild tenderness in the left fourth digit.  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   Lymphadenopathy:      Cervical: No cervical adenopathy.   Skin:     General: Skin is warm and dry.      Capillary Refill: Capillary refill takes less than 2 seconds.      Coloration: Skin is not jaundiced or pale.      Findings: No erythema or rash.   Neurological:      General: No focal deficit present.      Mental Status: He is alert and oriented to person, place, and time. Mental status is at baseline.      Cranial Nerves: No cranial nerve deficit.      Sensory: No sensory deficit.      Motor: No abnormal muscle tone.      Coordination: Coordination normal.      Deep Tendon Reflexes: Reflexes are normal and symmetric.   Psychiatric:         Mood and Affect: Mood normal.         Behavior:  Behavior normal.         Thought Content: Thought content normal.         Judgment: Judgment normal.          MDM  Number of Diagnoses or Management Options  Acute bilateral low back pain with left-sided sciatica  Pain in joint of  left hand  Diagnosis management comments: Differential diagnosis includes: Fracture, contusion, sprain.     Reviewed the patient's [place] PMP report.  Patient has multiple addresses and registered as a male and male at the same address.  There is multiple prescriptions for fentanyl and Vicodin.  Patient will be discharged home on Flexeril and Toradol with instructions to follow-up with his orthopedic surgeon and primary care physician.       Amount and/or Complexity of Data Reviewed  Tests in the radiology section of CPT??: ordered and reviewed  Independent visualization of images, tracings, or specimens: yes    Risk of Complications, Morbidity, and/or Mortality  Presenting problems: low  Diagnostic procedures: low  Management options: low    Patient Progress  Patient progress: stable         Procedures    Orders Placed This Encounter   ??? XR SPINE LUMB MIN 4 V     Standing Status:   Standing     Number of Occurrences:   1     Order Specific Question:   Transport     Answer:   Wheelchair [7]     Order Specific Question:   Reason for Exam     Answer:   pain   ??? XR HAND LT MIN 3 V     Standing Status:   Standing     Number of Occurrences:   1     Order Specific Question:   Transport     Answer:   Wheelchair [7]     Order Specific Question:   Reason for Exam     Answer:   pain left ring finger, s/p fall   ??? oxyCODONE-acetaminophen (PERCOCET) 5-325 mg per tablet 1 Tab   ??? ketorolac (TORADOL) 10 mg tablet     Sig: Take 1 Tab by mouth every eight (8) hours as needed for Pain for up to 5 days.     Dispense:  15 Tab     Refill:  0   ??? cyclobenzaprine (FLEXERIL) 10 mg tablet     Sig: Take 1 Tab by mouth three (3) times daily as needed for Muscle Spasm(s).     Dispense:  15 Tab     Refill:  0      XR SPINE LUMB MIN 4 V    (Results Pending)   XR HAND LT MIN 3 V    (Results Pending)     Preliminary interpretation by Dr. Loletta Specter -DJD of the lumbar spine.  No loss of height or fracture noted.    X-ray of hand reveal no acute fracture or displacement.           10:07 PM Upon re-evaluation the patient's symptoms have improved. Pt has non-toxic appearance and condition is stable for discharge. He was informed of his results, instructed to f/u with Dr. Hassell Done and his PCP and return to the ED upon worsening of symptoms. All questions and concerns were addressed.      Diagnosis:   1. Pain in joint of left hand    2. Acute bilateral low back pain with left-sided sciatica          Disposition: Discharge home    Follow-up Information     Follow up With Specialties Details Why Contact Info    Lonia Mad, [place] Physician Assistant Schedule an appointment as soon as possible for a visit in 1 day  [address]  [place] [address]  [phone]  [facility] EMERGENCY DEPT Emergency Medicine  As needed, If symptoms worsen [place]    Sheliah Hatch, MD Orthopedic Surgery Schedule an appointment as soon as possible for a visit in 1 day  [address]  [address]  [place] [address]  [phone]            Patient's Medications   Start Taking    CYCLOBENZAPRINE (FLEXERIL) 10 MG TABLET    Take 1 Tab by mouth three (3) times daily as needed for Muscle Spasm(s).    KETOROLAC (TORADOL) 10 MG TABLET    Take 1 Tab by mouth every eight (8) hours as needed for Pain for up to 5 days.   Continue Taking    ASPIRIN 81 MG CHEWABLE TABLET    Take 81 mg by mouth daily.    ATENOLOL (TENORMIN) 25 MG TABLET    Take 1 Tab by mouth daily.    BACLOFEN (LIORESAL) 10 MG TABLET    TAKE 1 TABLET BY MOUTH TWICE A DAY AS NEEDED    DILTIAZEM ER (CARDIZEM CD) 120 MG CAPSULE    TAKE 1 CAPSULE BY MOUTH ONCE DAILY    ENALAPRIL MALEATE PO    Take  by mouth. Pt takes, but does not know dosage    FENTANYL  (DURAGESIC) 25 MCG/HR PATCH    APPLY 1 PATCH TOPICALLY EVERY 3 DAYS    HYDROCHLOROTHIAZIDE (HYDRODIURIL) 25 MG TABLET    Take 1 Tab by mouth daily.    HYDROCODONE-ACETAMINOPHEN (NORCO) 7.5-325 MG PER TABLET    Take 1 Tab by mouth three (3) times daily as needed.    NITROGLYCERIN (NITROSTAT) 0.4 MG SL TABLET    1 Tab by SubLINGual route every five (5) minutes as needed for Chest Pain.    TAMSULOSIN (FLOMAX) 0.4 MG CAPSULE    Take 1 Cap by mouth daily (after dinner).   These Medications have changed    No medications on file   Stop Taking    No medications on file

## 2019-09-19 NOTE — ED Triage Notes (Signed)
Pt fell off deck about 4 ft denies loc  C/o L hand and baack pain

## 2019-09-19 NOTE — ED Notes (Signed)
D/c reinforced by md and rn pt with good understanding home with friend, ambulates with a steady gait

## 2019-09-19 NOTE — ED Provider Notes (Signed)
HPI   49 year old Caucasian male with past medical history of coronary artery disease, left renal mass, DJD of lumbar spine, schizoaffective disorder depressive type, osteoarthritis of knee presents with a chief complaint of left hand pain, and lower back pain x2 days.  Patient states that he slipped and fell on his deck while running away from his children.  He states that he fell backwards and tried to break his fall on the stairs.  Patient states that he has pain in the left ring finger.  Patient status post degloving injury 13 years ago of his left hand.  Hand was repaired after multiple revisions and skin graft.  Patient also complains of pain in his lower back.  He states he had a laminectomy several years ago secondary to degenerative disc disease.  Patient states that he is followed by Dr. Hassell Done orthopedic surgeon in [place].  He states that he took baclofen for his pain earlier today which did not help with his symptoms.  Patient currently rates his pain 10 out of 10 in intensity.  He is sitting comfortably on the stretcher reading his phone and does not appear to be in the stated level of pain.  Past Medical History:   Diagnosis Date   ??? CAD (coronary artery disease)    ??? Cancer ([place])     kidney   ??? DDD (degenerative disc disease), lumbar    ??? Diabetes ([place])    ??? Hypertension    ??? OA (osteoarthritis) of knee    ??? Renal lesion        Past Surgical History:   Procedure Laterality Date   ??? HX GASTRIC BYPASS  2005   ??? HX HERNIA REPAIR  2015   ??? HX ORTHOPAEDIC  2013    fractured back   ??? HX OTHER SURGICAL Bilateral 2012, 2014    Rotator cuff   ??? HX OTHER SURGICAL Left 1989    degloved injury   ??? HX SHOULDER ARTHROSCOPY      rotator cuff repair x 2         Family History:   Problem Relation Age of Onset   ??? Diabetes Mother    ??? Cancer Mother         breast   ??? Heart Disease Mother    ??? Cancer Father         lung   ??? Diabetes Father    ??? Stroke Father    ??? Diabetes Sister    ??? Hypertension Sister    ???  Osteoporosis Sister    ??? Cancer Sister         cervical cancer   ??? Heart Failure Sister    ??? Hypertension Sister    ??? Kidney Disease Brother         had transplant   ??? Hypertension Brother    ??? Diabetes Brother    ??? Hypertension Sister    ??? Diabetes Brother    ??? Stroke Brother    ??? Hypertension Brother    ??? Heart Attack Brother    ??? Heart Attack Brother    ??? Hypertension Brother        Social History     Socioeconomic History   ??? Marital status: MARRIED     Spouse name: Not on file   ??? Number of children: Not on file   ??? Years of education: Not on file   ??? Highest education level: Not on file   Occupational History   ???  Not on file   Social Needs   ??? Financial resource strain: Not on file   ??? Food insecurity     Worry: Not on file     Inability: Not on file   ??? Transportation needs     Medical: Not on file     Non-medical: Not on file   Tobacco Use   ??? Smoking status: Former Smoker   ??? Smokeless tobacco: Never Used   Substance and Sexual Activity   ??? Alcohol use: Not Currently     Alcohol/week: 0.0 standard drinks     Comment: rare   ??? Drug use: No   ??? Sexual activity: Yes     Partners: Female   Lifestyle   ??? Physical activity     Days per week: Not on file     Minutes per session: Not on file   ??? Stress: Not on file   Relationships   ??? Social Product manager on phone: Not on file     Gets together: Not on file     Attends religious service: Not on file     Active member of club or organization: Not on file     Attends meetings of clubs or organizations: Not on file     Relationship status: Not on file   ??? Intimate partner violence     Fear of current or ex partner: Not on file     Emotionally abused: Not on file     Physically abused: Not on file     Forced sexual activity: Not on file   Other Topics Concern   ??? Not on file   Social History Narrative   ??? Not on file         ALLERGIES: Heparin analogues    Review of Systems   Constitutional: Negative for appetite change, chills, diaphoresis, fatigue and fever.    HENT: Negative for congestion, ear pain, nosebleeds, rhinorrhea, sinus pressure and sore throat.    Eyes: Negative for pain, discharge and redness.   Respiratory: Negative for cough, choking, chest tightness, shortness of breath and wheezing.    Cardiovascular: Negative for chest pain, palpitations and leg swelling.   Gastrointestinal: Negative for abdominal pain, blood in stool, constipation, diarrhea, nausea and vomiting.   Endocrine: Negative for polyuria.   Genitourinary: Negative for decreased urine volume, difficulty urinating, dysuria, flank pain, frequency, genital sores, hematuria, scrotal swelling, testicular pain and urgency.   Musculoskeletal: Positive for back pain and joint swelling. Negative for neck pain and neck stiffness.   Skin: Negative for pallor, rash and wound.   Neurological: Negative for tremors, seizures, syncope, speech difficulty, weakness, light-headedness and headaches.   Hematological: Negative for adenopathy. Does not bruise/bleed easily.   Psychiatric/Behavioral: Negative for agitation, behavioral problems, confusion, hallucinations, self-injury, sleep disturbance and suicidal ideas. The patient is not nervous/anxious and is not hyperactive.        Vitals:    09/19/19 2052   BP: (!) 142/89   Pulse: 75   Resp: 18   Temp: 98 ??F (36.7 ??C)   SpO2: 97%   Weight: 131.5 kg (290 lb)   Height: 6\' 3"  (1.905 m)            Physical Exam  Vitals signs and nursing note reviewed.   Constitutional:       General: He is not in acute distress.     Appearance: Normal appearance. He is well-developed. He is obese. He is not  diaphoretic.   HENT:      Head: Normocephalic and atraumatic.      Right Ear: Tympanic membrane and external ear normal.      Left Ear: Tympanic membrane and external ear normal.      Nose: Nose normal.      Mouth/Throat:      Mouth: Mucous membranes are moist.      Pharynx: No oropharyngeal exudate.   Eyes:      General: No scleral icterus.        Right eye: No discharge.          Left eye: No discharge.      Extraocular Movements: Extraocular movements intact.      Conjunctiva/sclera: Conjunctivae normal.      Pupils: Pupils are equal, round, and reactive to light.   Neck:      Musculoskeletal: Normal range of motion and neck supple.      Thyroid: No thyromegaly.      Vascular: No JVD.      Trachea: No tracheal deviation.   Cardiovascular:      Rate and Rhythm: Normal rate and regular rhythm.      Heart sounds: Normal heart sounds. No murmur. No friction rub. No gallop.    Pulmonary:      Effort: Pulmonary effort is normal. No respiratory distress.      Breath sounds: Normal breath sounds. No stridor. No wheezing or rales.   Chest:      Chest wall: No tenderness.   Abdominal:      General: Bowel sounds are normal. There is no distension.      Palpations: Abdomen is soft. There is no mass.      Tenderness: There is no abdominal tenderness. There is no guarding or rebound.   Musculoskeletal: Normal range of motion.         General: Tenderness (Mild tenderness in the lumbar spine.  No step off or crepitus.  Midline surgical scar, lower lumbar spine.) present. No swelling.      Comments: Mild tenderness in the left fourth digit.  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   Lymphadenopathy:      Cervical: No cervical adenopathy.   Skin:     General: Skin is warm and dry.      Capillary Refill: Capillary refill takes less than 2 seconds.      Coloration: Skin is not jaundiced or pale.      Findings: No erythema or rash.   Neurological:      General: No focal deficit present.      Mental Status: He is alert and oriented to person, place, and time. Mental status is at baseline.      Cranial Nerves: No cranial nerve deficit.      Sensory: No sensory deficit.      Motor: No abnormal muscle tone.      Coordination: Coordination normal.      Deep Tendon Reflexes: Reflexes are normal and symmetric.   Psychiatric:         Mood and Affect: Mood normal.         Behavior:  Behavior normal.         Thought Content: Thought content normal.         Judgment: Judgment normal.          MDM  Number of Diagnoses or Management Options  Acute bilateral low back pain with left-sided sciatica  Pain in joint of  left hand  Diagnosis management comments: Differential diagnosis includes: Fracture, contusion, sprain.     Reviewed the patient's [place] PMP report.  Patient has multiple addresses and registered as a male and male at the same address.  There is multiple prescriptions for fentanyl and Vicodin.  Patient will be discharged home on Flexeril and Toradol with instructions to follow-up with his orthopedic surgeon and primary care physician.       Amount and/or Complexity of Data Reviewed  Tests in the radiology section of CPT??: ordered and reviewed  Independent visualization of images, tracings, or specimens: yes    Risk of Complications, Morbidity, and/or Mortality  Presenting problems: low  Diagnostic procedures: low  Management options: low    Patient Progress  Patient progress: stable         Procedures    Orders Placed This Encounter   ??? XR SPINE LUMB MIN 4 V     Standing Status:   Standing     Number of Occurrences:   1     Order Specific Question:   Transport     Answer:   Wheelchair [7]     Order Specific Question:   Reason for Exam     Answer:   pain   ??? XR HAND LT MIN 3 V     Standing Status:   Standing     Number of Occurrences:   1     Order Specific Question:   Transport     Answer:   Wheelchair [7]     Order Specific Question:   Reason for Exam     Answer:   pain left ring finger, s/p fall   ??? oxyCODONE-acetaminophen (PERCOCET) 5-325 mg per tablet 1 Tab   ??? ketorolac (TORADOL) 10 mg tablet     Sig: Take 1 Tab by mouth every eight (8) hours as needed for Pain for up to 5 days.     Dispense:  15 Tab     Refill:  0   ??? cyclobenzaprine (FLEXERIL) 10 mg tablet     Sig: Take 1 Tab by mouth three (3) times daily as needed for Muscle Spasm(s).     Dispense:  15 Tab     Refill:  0      XR SPINE LUMB MIN 4 V    (Results Pending)   XR HAND LT MIN 3 V    (Results Pending)     Preliminary interpretation by Dr. Loletta Specter -DJD of the lumbar spine.  No loss of height or fracture noted.    X-ray of hand reveal no acute fracture or displacement.           10:07 PM Upon re-evaluation the patient's symptoms have improved. Pt has non-toxic appearance and condition is stable for discharge. He was informed of his results, instructed to f/u with Dr. Hassell Done and his PCP and return to the ED upon worsening of symptoms. All questions and concerns were addressed.      Diagnosis:   1. Pain in joint of left hand    2. Acute bilateral low back pain with left-sided sciatica          Disposition: Discharge home    Follow-up Information     Follow up With Specialties Details Why Contact Info    Lonia Mad, [place] Physician Assistant Schedule an appointment as soon as possible for a visit in 1 day  [address]  [place] [address]  [phone]  [facility] EMERGENCY DEPT Emergency Medicine  As needed, If symptoms worsen [place]    Sheliah Hatch, MD Orthopedic Surgery Schedule an appointment as soon as possible for a visit in 1 day  [address]  [address]  [place] [address]  [phone]            Patient's Medications   Start Taking    CYCLOBENZAPRINE (FLEXERIL) 10 MG TABLET    Take 1 Tab by mouth three (3) times daily as needed for Muscle Spasm(s).    KETOROLAC (TORADOL) 10 MG TABLET    Take 1 Tab by mouth every eight (8) hours as needed for Pain for up to 5 days.   Continue Taking    ASPIRIN 81 MG CHEWABLE TABLET    Take 81 mg by mouth daily.    ATENOLOL (TENORMIN) 25 MG TABLET    Take 1 Tab by mouth daily.    BACLOFEN (LIORESAL) 10 MG TABLET    TAKE 1 TABLET BY MOUTH TWICE A DAY AS NEEDED    DILTIAZEM ER (CARDIZEM CD) 120 MG CAPSULE    TAKE 1 CAPSULE BY MOUTH ONCE DAILY    ENALAPRIL MALEATE PO    Take  by mouth. Pt takes, but does not know dosage     FENTANYL (DURAGESIC) 25 MCG/HR PATCH    APPLY 1 PATCH TOPICALLY EVERY 3 DAYS    HYDROCHLOROTHIAZIDE (HYDRODIURIL) 25 MG TABLET    Take 1 Tab by mouth daily.    HYDROCODONE-ACETAMINOPHEN (NORCO) 7.5-325 MG PER TABLET    Take 1 Tab by mouth three (3) times daily as needed.    NITROGLYCERIN (NITROSTAT) 0.4 MG SL TABLET    1 Tab by SubLINGual route every five (5) minutes as needed for Chest Pain.    TAMSULOSIN (FLOMAX) 0.4 MG CAPSULE    Take 1 Cap by mouth daily (after dinner).   These Medications have changed    No medications on file   Stop Taking    No medications on file

## 2019-09-19 NOTE — ED Notes (Signed)
Pt fell off deck about 4 ft denies loc  C/o L hand and baack pain

## 2019-09-20 ENCOUNTER — Inpatient Hospital Stay
Admit: 2019-09-20 | Discharge: 2019-09-20 | Disposition: A | Discharge status: Home / Self Care | Payer: MEDICAID | Attending: Emergency Medicine

## 2019-09-20 MED ORDER — CYCLOBENZAPRINE 10 MG TAB
10 mg | ORAL_TABLET | Freq: Three times a day (TID) | ORAL | 0 refills | Status: DC | PRN
Start: 2019-09-20 — End: 2019-10-19

## 2019-09-20 MED ORDER — OXYCODONE-ACETAMINOPHEN 5 MG-325 MG TAB
5-325 mg | ORAL | Status: AC
Start: 2019-09-20 — End: 2019-09-19
  Administered 2019-09-20: 01:00:00 via ORAL

## 2019-09-20 MED ORDER — KETOROLAC TROMETHAMINE 10 MG TAB
10 mg | ORAL_TABLET | Freq: Three times a day (TID) | ORAL | 0 refills | Status: AC | PRN
Start: 2019-09-20 — End: 2019-09-24

## 2019-09-20 MED FILL — OXYCODONE-ACETAMINOPHEN 5 MG-325 MG TAB: 5-325 mg | ORAL | Qty: 1

## 2019-10-06 LAB — PSA SCREENING: PSA, Screening: 0.8 NA

## 2019-10-06 LAB — PSA SCREENING (SCREENING): PSA, total: 0.8

## 2019-10-07 LAB — COMPREHENSIVE METABOLIC PANEL
ALT: 22 IU/L (ref 0–44)
AST: 27 IU/L (ref 0–40)
Albumin/Globulin Ratio: 1.6 NA (ref 1.2–2.2)
Albumin: 4.5 g/dL (ref 3.8–4.9)
Alkaline Phosphatase: 105 IU/L (ref 39–117)
BUN: 15 mg/dL (ref 6–24)
Bun/Cre Ratio: 22 NA — ABNORMAL HIGH (ref 9–20)
CO2: 27 mmol/L (ref 20–29)
Calcium: 9 mg/dL (ref 8.7–10.2)
Chloride: 99 mmol/L (ref 96–106)
Creatinine: 0.68 mg/dL — ABNORMAL LOW (ref 0.76–1.27)
EGFR IF NonAfrican American: 111 mL/min/{1.73_m2} (ref >59)
GFR African American: 128 mL/min/{1.73_m2} (ref >59)
Globulin, Total: 2.8 g/dL (ref 1.5–4.5)
Glucose: 182 mg/dL — ABNORMAL HIGH (ref 65–99)
Potassium: 4.7 mmol/L (ref 3.5–5.2)
Sodium: 140 mmol/L (ref 134–144)
Total Bilirubin: 0.5 mg/dL (ref 0.0–1.2)
Total Protein: 7.3 g/dL (ref 6.0–8.5)

## 2019-10-07 LAB — CBC
Hematocrit: 40.4 % (ref 37.5–51.0)
Hemoglobin: 13.6 g/dL (ref 13.0–17.7)
MCH: 28.7 pg (ref 26.6–33.0)
MCHC: 33.7 g/dL (ref 31.5–35.7)
MCV: 85 fL (ref 79–97)
Platelets: 174 10*3/uL (ref 150–450)
RBC: 4.74 x10E6/uL (ref 4.14–5.80)
RDW: 13.6 % (ref 11.6–15.4)
WBC: 5.7 10*3/uL (ref 3.4–10.8)

## 2019-10-07 LAB — CBC W/O DIFF
HCT: 40.4 % (ref 37.5–51.0)
HGB: 13.6 g/dL (ref 13.0–17.7)
MCH: 28.7 pg (ref 26.6–33.0)
MCHC: 33.7 g/dL (ref 31.5–35.7)
MCV: 85 fL (ref 79–97)
PLATELET: 174 10*3/uL (ref 150–450)
RBC: 4.74 x10E6/uL (ref 4.14–5.80)
RDW: 13.6 % (ref 11.6–15.4)
WBC: 5.7 10*3/uL (ref 3.4–10.8)

## 2019-10-07 LAB — METABOLIC PANEL, COMPREHENSIVE
A-G Ratio: 1.6 (ref 1.2–2.2)
ALT (SGPT): 22 IU/L (ref 0–44)
AST (SGOT): 27 IU/L (ref 0–40)
Albumin: 4.5 g/dL (ref 3.8–4.9)
Alk. phosphatase: 105 IU/L (ref 39–117)
BUN/Creatinine ratio: 22 — ABNORMAL HIGH (ref 9–20)
BUN: 15 mg/dL (ref 6–24)
Bilirubin, total: 0.5 mg/dL (ref 0.0–1.2)
CO2: 27 mmol/L (ref 20–29)
Calcium: 9 mg/dL (ref 8.7–10.2)
Chloride: 99 mmol/L (ref 96–106)
Creatinine: 0.68 mg/dL — ABNORMAL LOW (ref 0.76–1.27)
GFR est AA: 128 mL/min/{1.73_m2} (ref >59)
GFR est non-AA: 111 mL/min/{1.73_m2} (ref >59)
GLOBULIN, TOTAL: 2.8 g/dL (ref 1.5–4.5)
Glucose: 182 mg/dL — ABNORMAL HIGH (ref 65–99)
Potassium: 4.7 mmol/L (ref 3.5–5.2)
Protein, total: 7.3 g/dL (ref 6.0–8.5)
Sodium: 140 mmol/L (ref 134–144)

## 2019-10-13 ENCOUNTER — Ambulatory Visit: Attending: Urology | Primary: Physician Assistant

## 2019-10-13 ENCOUNTER — Ambulatory Visit: Admit: 2019-10-13 | Discharge: 2019-10-13 | Attending: Urology | Primary: Physician Assistant

## 2019-10-13 DIAGNOSIS — N2889 Other specified disorders of kidney and ureter: Secondary | ICD-10-CM

## 2019-10-13 NOTE — Progress Notes (Signed)
Jordan Weiss  10/13/2019    NO SERVICES RENDERED. PATIENT RESCHEDULED TO [facility] 10/19/19 AT [facility] OFFICE.      Christena Deem. Jimmye Norman, MD, [place]   Urologic Oncology  [facility] [place]     Associate Professor of Urology  Department of Urology  [facility] [address], [address], [place]     Pager:  [phone]  Office:  [phone]    Medical documentation provided by Leta Speller, medical scribe for Leta Speller on 10/13/2019

## 2019-10-13 NOTE — Telephone Encounter (Signed)
A [facility] office(Not sure what office) called stating that pt showed up at their office and has a 11:45 appt with Dr. Jimmye Norman. Spoke with [place] on the back line and she spoke with Elmyra Ricks and informed me to inform pt that he will need to reschedule. When I transferred back over to pt [facility] office hung up so I was unable to reschedule pt. I tentatively rescheduled pt appt.

## 2019-10-13 NOTE — Progress Notes (Signed)
Progress  Notes by Leta Speller at 10/13/19 1145                Author: Leta Speller  Service: --  Author TypeClarisse Gouge: 10/30/19 1041  Encounter Date: 10/13/2019  Status: Signed          Editor: Leta Speller [employer])                          Jordan Weiss   10/13/2019      NO SERVICES RENDERED. PATIENT RESCHEDULED TO [facility] 10/19/19 AT [facility].         Christena Deem. Jimmye Norman, MD, [place]    Urologic Oncology   [facility] [place]       Associate Professor of Urology   Department of Urology   [facility] [place], [place], [place]       Pager:  [phone]   Office:  [phone]      Medical documentation provided by Leta Speller, medical scribe for Leta Speller on  10/13/2019

## 2019-10-18 ENCOUNTER — Encounter

## 2019-10-19 ENCOUNTER — Ambulatory Visit: Attending: Urology | Primary: Physician Assistant

## 2019-10-19 ENCOUNTER — Ambulatory Visit: Admit: 2019-10-19 | Discharge: 2019-10-19 | Attending: Urology | Primary: Physician Assistant

## 2019-10-19 DIAGNOSIS — N2889 Other specified disorders of kidney and ureter: Secondary | ICD-10-CM

## 2019-10-19 LAB — AMB POC URINALYSIS DIP STICK AUTO W/O MICRO
Bilirubin (UA POC): NEGATIVE
Bilirubin, Urine, POC: NEGATIVE
Blood (UA POC): NEGATIVE
Blood (UA POC): NEGATIVE
Leukocyte Esterase, Urine, POC: NEGATIVE
Leukocyte esterase (UA POC): NEGATIVE
Nitrite, Urine, POC: NEGATIVE
Nitrites (UA POC): NEGATIVE
Protein (UA POC): NEGATIVE
Protein, Urine, POC: NEGATIVE
Specific Gravity, Urine, POC: 1.025 NA (ref 1.001–1.035)
Specific gravity (UA POC): 1.025 (ref 1.001–1.035)
Urobilinogen (UA POC): 1 (ref 0.2–1)
Urobilinogen, POC: 1 (ref 0.2–1)
pH (UA POC): 5.5 (ref 4.6–8.0)
pH, Urine, POC: 5.5 NA (ref 4.6–8.0)

## 2019-10-19 NOTE — Progress Notes (Signed)
UROLOGIC ONCOLOGY RENAL MASS FOLLOW UP NOTE    Jordan Weiss  10/19/2019    ASSESSMENT:     1. 1.4 cm left renal lesion seen on CT AP 8/15   -Originally planning for left open partial nephrectomy in 5/16. Patient lost to follow up 2/2 losing health insurance. Last seen by Dr. Kandyce Rud on 06/11/18.   -Location:  [facility]   -Creatinine:  0.68 (4/21)   -Hgb:   13.6 (4/21)   -Nephrometry Score: 7p   -Current size:  2.2 cm (CT AP 3/21)    Current Disease Status: Small left renal mass concerning for malignancy  Current Disease Plan: Schedule LOPNx    2. 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 BPH with LUTS- urinary frequency    4. H/o Gastric Bypass   -internal hernia with emergent repair in 1/15    5. S/p Basal cell excision from forehead in 2019    6. Heart issues, utilizes NTG on occasion    7. Morbid obesity    Last Imaging:  1/15 CT AP w cont- bilateral renal cysts, the largest on the right 3 cm.  8/15 CT AP w wo cont- 3 cm right upper pole renal cyst, 1.4 cm hypodensity medial aspect of the mid pole left kidney, ultrasound suggested for further characterization.  8/15 RUS- 2.9 cm simple cyst upper pole right kidney.1.5 cm septated cyst upper pole right kidney. 1.4 cm lesion midpole left kidney.  4/16 CT AP w wo cont- 3 and 1.6 cm right renal cysts and 1.6 cm left [place] lesion. No significant change in the size or enhancement of the kidney lesion.  1/20 CT AP w wo Cont-  1.9 cm lesion arising along the posterior aspect of the left kidney.  3/21 CT AP w wo Cont- Solid appearing and likely enhancing nodule in the left kidney has slightly increased in size and measures 2.2 cm. No evidence of metastatic disease.     PLAN:     -Reviewed CT AP findings from 3/21 demonstrating slightly larger small left kidney mass, now measuring 2.2 cm.  -Once again discussed the fact that 80% of small renal masses are found to be malignant and that the primary treatment option is surgical excision via either open or  laparoscopic approach. Other treatment options discussed include ablative therapies (cryoablation v radiofrequency ablation) and active surveillance. All risks, benefits, post-op expectations, and long-term outcomes reviewed with the patient in detail. Discussed patient's increased risk of compartment syndrome due to morbid obesity.   -At this point in time desires to proceed with left open partial nephrectomy which is reasonable given likely malignant mass.   -Schedule left open partial nephrectomy via side approach. Surgery letter sent. Cardiac and PCP clearance prior.   -RTC for pre-op.   -All questions answered.     Patient's BMI is out of the normal parameters.  Information about BMI was given to the patient.       SUBJECTIVE:     Chief Complaint   Patient presents with   ??? Renal Mass     left       HPI: Jordan Weiss is a 49 y.o. male who was diagnosed with a left renal mass and elected active surveillance due to size, stability, and health insurance issues. Most recent CT AP findings from 3/21 demonstrates a solid appearing and likely enhancing nodule in the left kidney has slightly increased in size and measures 2.2 cm. No evidence of metastatic disease.  Patient is doing well today. Denies flank pain, gross hematuria, dysuria, asymptomatic for infection. No f/c/n/v.      ECOG PS: 0    Dx DATE: 01/2014     Review of Systems  Constitutional: Fever: No  Skin: Rash: No  HEENT: Hearing difficulty: No  Eyes: Blurred vision: No  Cardiovascular: Chest pain: No  Respiratory: Shortness of breath: No  Gastrointestinal: Nausea/vomiting: No  Musculoskeletal: Back pain: No  Neurological: Weakness: No  Psychological: Memory loss: No  Comments/additional findings:       Past Medical History:   Diagnosis Date   ??? CAD (coronary artery disease)    ??? Cancer ([place])     kidney   ??? DDD (degenerative disc disease), lumbar    ??? Diabetes ([place])    ??? Hypertension    ??? Melanoma ([place])     [place]   ??? OA (osteoarthritis) of knee    ??? Renal  lesion        Past Surgical History:   Procedure Laterality Date   ??? HX BACK SURGERY  07/2019   ??? HX GASTRIC BYPASS  2005   ??? HX GASTRIC BYPASS     ??? HX HERNIA REPAIR  2015   ??? HX ORTHOPAEDIC  2013    fractured back   ??? HX OTHER SURGICAL Bilateral 2012, 2014    Rotator cuff   ??? HX OTHER SURGICAL Left 1989    degloved injury   ??? HX SHOULDER ARTHROSCOPY      rotator cuff repair x 2       Allergies   Allergen Reactions   ??? Heparin Analogues Shortness of Breath and Angioedema       Current Outpatient Medications   Medication Sig   ??? omeprazole (PRILOSEC) 20 mg capsule TAKE 1 CAPSULE BY MOUTH ONCE DAILY   ??? multivitamin (ONE A DAY) tablet Take 1 Tab by mouth daily.   ??? ibuprofen (MOTRIN) 400 mg tablet Take  by mouth every six (6) hours as needed for Pain.   ??? acetaminophen (TylenoL) 325 mg tablet Take  by mouth every four (4) hours as needed for Pain.   ??? dilTIAZem ER (CARDIZEM CD) 120 mg capsule TAKE 1 CAPSULE BY MOUTH ONCE DAILY   ??? HYDROcodone-acetaminophen (NORCO) 7.5-325 mg per tablet Take 1 Tab by mouth three (3) times daily as needed.   ??? baclofen (LIORESAL) 10 mg tablet TAKE 1 TABLET BY MOUTH TWICE A DAY AS NEEDED   ??? aspirin 81 mg chewable tablet Take 81 mg by mouth daily.   ??? ENALAPRIL MALEATE PO Take  by mouth. Pt takes, but does not know dosage   ??? atenolol (TENORMIN) 25 mg tablet Take 1 Tab by mouth daily.   ??? nitroglycerin (NITROSTAT) 0.4 mg SL tablet 1 Tab by SubLINGual route every five (5) minutes as needed for Chest Pain.   ??? hydroCHLOROthiazide (HYDRODIURIL) 25 mg tablet Take 1 Tab by mouth daily.   ??? tamsulosin (FLOMAX) 0.4 mg capsule Take 1 Cap by mouth daily (after dinner).     No current facility-administered medications for this visit.        Family History   Problem Relation Age of Onset   ??? Diabetes Mother    ??? Cancer Mother         breast   ??? Heart Disease Mother    ??? Cancer Father         lung   ??? Diabetes Father    ??? Stroke Father    ???  Diabetes Sister    ??? Hypertension Sister    ??? Osteoporosis  Sister    ??? Cancer Sister         cervical cancer   ??? Heart Failure Sister    ??? Hypertension Sister    ??? Kidney Disease Brother         had transplant   ??? Hypertension Brother    ??? Diabetes Brother    ??? Hypertension Sister    ??? Diabetes Brother    ??? Stroke Brother    ??? Hypertension Brother    ??? Heart Attack Brother    ??? Heart Attack Brother    ??? Hypertension Brother      I have reviewed the family history and there are no changes at this time.    Social History     Socioeconomic History   ??? Marital status: MARRIED     Spouse name: Not on file   ??? Number of children: Not on file   ??? Years of education: Not on file   ??? Highest education level: Not on file   Tobacco Use   ??? Smoking status: Former Smoker   ??? Smokeless tobacco: Never Used   Substance and Sexual Activity   ??? Alcohol use: Not Currently     Alcohol/week: 0.0 standard drinks     Comment: rare   ??? Drug use: No   ??? Sexual activity: Yes     Partners: Female         OBJECTIVE:     Physical Exam:  Vitals:    10/19/19 1328   Weight: 290 lb (131.5 kg)   Height: 6\' 3"  (1.905 m)       Gen: Patient is in NAD, morbid obesity  Abd: non-distended   Ext: No BLEE  Neuro: Grossly intact   Skin: warm and perfused    LABS/ IMAGING:     Results for orders placed or performed in visit on 10/19/19   PSA SCREENING (SCREENING)   Result Value Ref Range    PSA, total 0.8    AMB POC URINALYSIS DIP STICK AUTO W/O MICRO   Result Value Ref Range    Color (UA POC) Yellow     Clarity (UA POC) Clear     Glucose (UA POC) 2+ Negative    Bilirubin (UA POC) Negative Negative    Ketones (UA POC) Trace Negative    Specific gravity (UA POC) 1.025 1.001 - 1.035    Blood (UA POC) Negative Negative    pH (UA POC) 5.5 4.6 - 8.0    Protein (UA POC) Negative Negative    Urobilinogen (UA POC) 1 mg/dL 0.2 - 1    Nitrites (UA POC) Negative Negative    Leukocyte esterase (UA POC) Negative Negative     CT AP w wo Cont 3/21:  FINDINGS:     LOWER CHEST: Unremarkable.     LIVER, BILIARY: Mild nodular contours  of the liver suggesting cirrhosis/chronic liver disease. No suspicious liver lesions. No biliary dilation. Gallbladder is unremarkable.     PANCREAS: Normal.     SPLEEN: Normal.     ADRENALS: Normal.     KIDNEYS/URETERS/BLADDER: Partially exophytic solid appearing, likely enhancing lesion again identified in the posterior aspect of the left kidney best visualized on image 77 series 9 measuring approximately 2.2 x 2 cm (2 x 1.6 cm previously). Simple right renal cysts also identified which are similar to the prior exam and do not require follow-up imaging. No hydronephrosis.  Renal veins are patent.     PELVIC ORGANS: Unremarkable.     LYMPH NODES: No enlarged lymph nodes.     GASTROINTESTINAL TRACT: No bowel dilation or wall thickening. Prior gastric bypass surgery.     VASCULATURE: Mild atherosclerosis.     BONES: Multilevel degenerative changes in the spine. No acute osseous abnormalities.     OTHER: None.     _______________     IMPRESSION     1. 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 No evidence of metastatic disease.     2. Nodular contours of the liver suggesting cirrhosis/chronic liver disease.           Christena Deem. Jimmye Norman, MD, [place]   Urologic Oncology  [facility] [place]    Associate Professor of Urology  Department of Urology  [facility] [place], [place]     Pager:  [phone]  Office:  [phone]         ICD-10-CM ICD-9-CM    1. Renal mass  N28.89 593.9 AMB POC URINALYSIS DIP STICK AUTO W/O MICRO   2. BPH with obstruction/lower urinary tract symptoms  N40.1 600.01 AMB POC URINALYSIS DIP STICK AUTO W/O MICRO    N13.8 599.69        A copy of today's office visit with all pertinent imaging results and labs were sent to [place] [facility], [place] for review.    Medical documentation provided by Leta Speller, medical scribe for  Renford Dills, MD on 10/19/2019

## 2019-10-19 NOTE — Progress Notes (Signed)
Progress Notes by Renford Dills, MD at 10/19/19 1315                Author: Renford Dills, MD  Service: --  Author Type: Physician       Filed: 10/30/19 1042  Encounter Date: 10/19/2019  Status: Signed          Editor: Renford Dills, MD (Physician)                          UROLOGIC ONCOLOGY RENAL MASS FOLLOW UP NOTE      Monte Fantasia   10/19/2019        ASSESSMENT:        1. 1.4 cm left renal lesion seen on CT AP 8/15    -Originally planning for left open partial nephrectomy in 5/16. Patient lost to follow up 2/2 losing health insurance. Last seen by Dr. Kandyce Rud on 06/11/18.    -Location:  [facility]    -Creatinine:  0.68 (4/21)    -Hgb:   13.6 (4/21)    -Nephrometry Score: 7p    -Current size:  2.2 cm (CT AP 3/21)      Current Disease Status: Small left renal mass concerning for malignancy   Current Disease Plan: Schedule LOPNx      2. 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 BPH with LUTS- urinary frequency      4. H/o Gastric Bypass    -internal hernia with emergent repair in 1/15      5. S/p Basal cell excision from forehead in 2019      6. Heart issues, utilizes NTG on occasion      7. Morbid obesity      Last Imaging:   1/15 CT AP w cont- bilateral renal cysts, the largest on the right 3 cm.   8/15 CT AP w wo cont- 3 cm right upper pole renal cyst, 1.4 cm hypodensity medial aspect of the mid pole left kidney, ultrasound suggested for further characterization.   8/15 RUS- 2.9 cm simple cyst upper pole right kidney.1.5 cm septated cyst upper pole right kidney. 1.4 cm lesion midpole left kidney.   4/16 CT AP w wo cont- 3 and 1.6 cm right renal cysts and 1.6 cm left [place] lesion. No significant change in the size or enhancement of the kidney lesion.   1/20 CT AP w wo Cont-  1.9 cm lesion arising along the posterior aspect of the left kidney.   3/21 CT AP w wo Cont- Solid appearing and likely enhancing nodule in the left kidney has slightly increased in size and measures 2.2 cm. No  evidence of metastatic disease.         PLAN:        -Reviewed CT AP findings from 3/21 demonstrating slightly larger small left kidney mass, now measuring 2.2 cm.   -Once again discussed the fact that 80% of small renal masses are found to be malignant and that the primary treatment option is surgical excision via either open or laparoscopic approach. Other treatment options discussed include ablative therapies (cryoablation  v radiofrequency ablation) and active surveillance. All risks, benefits, post-op expectations, and long-term outcomes reviewed with the patient in detail. Discussed patient's increased risk of compartment syndrome due to morbid obesity.    -At this point in time desires to proceed with left open partial nephrectomy which is reasonable given likely malignant mass.    -  Schedule left open partial nephrectomy via side approach. Surgery letter sent. Cardiac and PCP clearance prior.    -RTC for pre-op.    -All questions answered.       Patient's BMI is out of the normal parameters.  Information about BMI was given to the patient.            SUBJECTIVE:          Chief Complaint       Patient presents with        ?  Renal Mass             left           HPI: Kin Totty is a  49 y.o. male who was diagnosed with a left renal mass and elected active surveillance due  to size, stability, and health insurance issues. Most recent CT AP findings from 3/21 demonstrates a solid appearing and likely enhancing nodule in the left kidney has slightly increased in size and measures 2.2 cm. No evidence of metastatic disease.  Patient is doing well today. Denies flank pain, gross hematuria, dysuria, asymptomatic for infection. No f/c/n/v.        ECOG PS: 0      Dx DATE: 01/2014       Review of Systems   Constitutional: Fever: No   Skin: Rash: No   HEENT: Hearing difficulty: No   Eyes: Blurred vision: No   Cardiovascular: Chest pain: No   Respiratory: Shortness of breath: No   Gastrointestinal: Nausea/vomiting:  No   Musculoskeletal: Back pain: No   Neurological: Weakness: No   Psychological: Memory loss: No   Comments/additional findings:            Past Medical History:        Diagnosis  Date         ?  CAD (coronary artery disease)       ?  Cancer [facility])            kidney         ?  DDD (degenerative disc disease), lumbar       ?  Diabetes ([place])       ?  Hypertension       ?  Melanoma ([place])            [place]         ?  OA (osteoarthritis) of knee           ?  Renal lesion               Past Surgical History:         Procedure  Laterality  Date          ?  HX BACK SURGERY    07/2019     ?  HX GASTRIC BYPASS    2005     ?  HX GASTRIC BYPASS         ?  HX HERNIA REPAIR    2015     ?  HX ORTHOPAEDIC    2013          fractured back          ?  HX OTHER SURGICAL  Bilateral  2012, 2014          Rotator cuff          ?  HX OTHER SURGICAL  Left  1989          degloved injury          ?  HX SHOULDER ARTHROSCOPY              rotator cuff repair x 2             Allergies        Allergen  Reactions         ?  Heparin Analogues  Shortness of Breath and Angioedema             Current Outpatient Medications        Medication  Sig         ?  omeprazole (PRILOSEC) 20 mg capsule  TAKE 1 CAPSULE BY MOUTH ONCE DAILY     ?  multivitamin (ONE A DAY) tablet  Take 1 Tab by mouth daily.     ?  ibuprofen (MOTRIN) 400 mg tablet  Take  by mouth every six (6) hours as needed for Pain.     ?  acetaminophen (TylenoL) 325 mg tablet  Take  by mouth every four (4) hours as needed for Pain.     ?  dilTIAZem ER (CARDIZEM CD) 120 mg capsule  TAKE 1 CAPSULE BY MOUTH ONCE DAILY     ?  HYDROcodone-acetaminophen (NORCO) 7.5-325 mg per tablet  Take 1 Tab by mouth three (3) times daily as needed.     ?  baclofen (LIORESAL) 10 mg tablet  TAKE 1 TABLET BY MOUTH TWICE A DAY AS NEEDED     ?  aspirin 81 mg chewable tablet  Take 81 mg by mouth daily.     ?  ENALAPRIL MALEATE PO  Take  by mouth. Pt takes, but does not know dosage     ?  atenolol (TENORMIN) 25 mg tablet   Take 1 Tab by mouth daily.     ?  nitroglycerin (NITROSTAT) 0.4 mg SL tablet  1 Tab by SubLINGual route every five (5) minutes as needed for Chest Pain.     ?  hydroCHLOROthiazide (HYDRODIURIL) 25 mg tablet  Take 1 Tab by mouth daily.         ?  tamsulosin (FLOMAX) 0.4 mg capsule  Take 1 Cap by mouth daily (after dinner).          No current facility-administered medications for this visit.              Family History         Problem  Relation  Age of Onset          ?  Diabetes  Mother       ?  Cancer  Mother                breast          ?  Heart Disease  Mother       ?  Cancer  Father                lung          ?  Diabetes  Father       ?  Stroke  Father       ?  Diabetes  Sister       ?  Hypertension  Sister       ?  Osteoporosis  Sister       ?  Cancer  Sister                cervical cancer          ?  Heart Failure  Sister       ?  Hypertension  Sister       ?  Kidney Disease  Brother                had transplant          ?  Hypertension  Brother       ?  Diabetes  Brother       ?  Hypertension  Sister       ?  Diabetes  Brother       ?  Stroke  Brother       ?  Hypertension  Brother       ?  Heart Attack  Brother       ?  Heart Attack  Brother            ?  Hypertension  Brother          I have reviewed the family history and there are no changes at this time.        Social History          Socioeconomic History         ?  Marital status:  MARRIED              Spouse name:  Not on file         ?  Number of children:  Not on file     ?  Years of education:  Not on file     ?  Highest education level:  Not on file       Tobacco Use         ?  Smoking status:  Former Smoker     ?  Smokeless tobacco:  Never Used       Substance and Sexual Activity         ?  Alcohol use:  Not Currently              Alcohol/week:  0.0 standard drinks             Comment: rare         ?  Drug use:  No     ?  Sexual activity:  Yes              Partners:  Female                OBJECTIVE:        Physical Exam:     Vitals:           10/19/19 1328        Weight:  290 lb (131.5 kg)        Height:  6\' 3"  (1.905 m)           Gen: Patient is in NAD, morbid obesity   Abd: non-distended    Ext: No BLEE   Neuro: Grossly intact    Skin: warm and perfused        LABS/ IMAGING:          Results for orders placed or performed in visit on 10/19/19     PSA SCREENING (SCREENING)         Result  Value  Ref Range            PSA, total  0.8         AMB POC URINALYSIS DIP STICK AUTO W/O MICRO         Result  Value  Ref Range  Color (UA POC)  Yellow         Clarity (UA POC)  Clear         Glucose (UA POC)  2+  Negative       Bilirubin (UA POC)  Negative  Negative       Ketones (UA POC)  Trace  Negative       Specific gravity (UA POC)  1.025  1.001 - 1.035       Blood (UA POC)  Negative  Negative       pH (UA POC)  5.5  4.6 - 8.0       Protein (UA POC)  Negative  Negative       Urobilinogen (UA POC)  1 mg/dL  0.2 - 1       Nitrites (UA POC)  Negative  Negative            Leukocyte esterase (UA POC)  Negative  Negative        CT AP w wo Cont 3/21:   FINDINGS:     LOWER CHEST: Unremarkable.     LIVER, BILIARY: Mild nodular contours of the liver suggesting cirrhosis/chronic liver disease. No suspicious liver  lesions. No biliary dilation. Gallbladder is unremarkable.     PANCREAS: Normal.     SPLEEN: Normal.     ADRENALS: Normal.     KIDNEYS/URETERS/BLADDER: Partially exophytic solid appearing, likely enhancing lesion again identified  in the posterior aspect of the left kidney best visualized on image 77 series 9 measuring approximately 2.2 x 2 cm (2 x 1.6 cm previously). Simple right renal cysts also identified which are similar to the prior exam and do not require follow-up imaging.  No hydronephrosis. Renal veins are patent.     PELVIC ORGANS: Unremarkable.     LYMPH NODES: No enlarged lymph nodes.     GASTROINTESTINAL TRACT: No bowel dilation or wall thickening. Prior gastric bypass surgery.     VASCULATURE:  Mild atherosclerosis.     BONES: Multilevel  degenerative changes in the spine. No acute osseous abnormalities.     OTHER: None.     _______________     IMPRESSION     1. 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 No evidence of metastatic disease.     2. Nodular contours of the liver suggesting cirrhosis/chronic liver disease.                Christena Deem. Jimmye Norman, MD, [place]    Urologic Oncology   [facility] [place]      Associate Professor of Urology   Department of Urology   [facility] [place], [place]       Pager:  [phone]   Office:  [phone]                    ICD-10-CM  ICD-9-CM             1.  Renal mass   N28.89  593.9  AMB POC URINALYSIS DIP STICK AUTO W/O MICRO     2.  BPH with obstruction/lower urinary tract symptoms   N40.1  600.01  AMB POC URINALYSIS DIP STICK AUTO W/O MICRO            N13.8  599.69  A copy of today's office visit with all pertinent imaging results and labs were sent to [place], [place]  for review.      Medical documentation provided by Leta Speller, medical scribe for Renford Dills, MD  on 10/19/2019

## 2019-10-24 NOTE — Telephone Encounter (Signed)
Please return call to schedule.   Pt can be reached at either number on file.

## 2019-10-27 ENCOUNTER — Encounter: Attending: Urology | Primary: Physician Assistant

## 2019-10-31 NOTE — Telephone Encounter (Signed)
Pt called to find out the status of his surgery. Pt was advised that it most likely we were trying to line up a surgery date but I would pass the msg to the scheduler.   CBN: [phone]  Or [phone]

## 2019-11-01 NOTE — Telephone Encounter (Signed)
Pt called to schedule his sx stating he was disconnected. Pt was transferred to [facility].

## 2019-11-03 NOTE — Telephone Encounter (Signed)
Patient requesting to speak with you regarding surgery date. CBN [phone]. Please assist. Thank you.

## 2019-11-07 LAB — BASIC METABOLIC PANEL
Anion Gap: 10.9 mmol/L (ref 3.0–15.0)
BUN: 13 mg/dL (ref 6–22)
CO2: 29 mmol/L (ref 20–32)
Calcium: 9.7 mg/dL (ref 8.4–10.5)
Chloride: 100 mmol/L (ref 98–110)
Creatinine: 0.6 mg/dL (ref 0.5–1.2)
GFR African American: >60 (ref >60.0)
GFR Non-African American: >60 (ref >60.0)
Glucose: 193 mg/dL — ABNORMAL HIGH (ref 70–99)
Potassium: 4.5 mmol/L (ref 3.5–5.5)
Sodium: 140 mmol/L (ref 133–145)

## 2019-11-07 LAB — CBC
Hematocrit: 41.1 % (ref 39.3–51.6)
Hemoglobin: 13.4 g/dL (ref 13.1–17.2)
MCH: 28 pg (ref 26–34)
MCHC: 33 g/dL (ref 31–36)
MCV: 86 fL (ref 80–95)
MPV: 11.2 fL (ref 9.0–13.0)
Platelets: 166 10*3/uL (ref 140–440)
RBC: 4.76 M/uL (ref 3.80–5.80)
RDW: 13.7 % (ref 10.0–15.5)
WBC: 5.2 10*3/uL (ref 4.0–11.0)

## 2019-11-07 LAB — METABOLIC PANEL, BASIC
Anion gap: 10.9 mmol/L (ref 3.0–15.0)
BUN: 13 mg/dL (ref 6–22)
CO2: 29 mmol/L (ref 20–32)
Calcium: 9.7 mg/dL (ref 8.4–10.5)
Chloride: 100 mmol/L (ref 98–110)
Creatinine: 0.6 mg/dL (ref 0.5–1.2)
GFRAA: >60 (ref >60.0)
GFRNA: >60 (ref >60.0)
Glucose: 193 mg/dL — ABNORMAL HIGH (ref 70–99)
Potassium: 4.5 mmol/L (ref 3.5–5.5)
Sodium: 140 mmol/L (ref 133–145)

## 2019-11-07 LAB — CBC W/O DIFF
HCT: 41.1 % (ref 39.3–51.6)
HGB: 13.4 g/dL (ref 13.1–17.2)
MCH: 28 pg (ref 26–34)
MCHC: 33 g/dL (ref 31–36)
MCV: 86 fL (ref 80–95)
MPV: 11.2 fL (ref 9.0–13.0)
PLATELET: 166 10*3/uL (ref 140–440)
RBC: 4.76 M/uL (ref 3.80–5.80)
RDW: 13.7 % (ref 10.0–15.5)
WBC: 5.2 10*3/uL (ref 4.0–11.0)

## 2019-11-07 NOTE — Progress Notes (Signed)
The recently submitted urine culture has demonstrated no evidence for bacterial growth. As such, there is not a requirement for treatment at this time.

## 2019-11-07 NOTE — Progress Notes (Signed)
Letter sent.    Jordan Weiss

## 2019-11-08 LAB — CULTURE, URINE
RESULT: NORMAL
Result: NORMAL

## 2019-11-10 NOTE — Telephone Encounter (Signed)
Patient requesting CB from [facility] regarding his preop covid test.

## 2019-11-10 NOTE — Telephone Encounter (Signed)
Patient requesting CB from [facility] regarding his preop covid test.

## 2019-12-01 NOTE — Telephone Encounter (Signed)
Pt called, states that he just had surgery tues and states that he's in a lot of pain. He states that the toradol doesn't do anything and he will need something stronger. He stated that his pain management doctor states that it would be ok for [place] to prescribe hydrocodone. Pt stated that we can speak w/ his pain management doctor, dr Willeen Niece at [facility] rehab if we have any questions/concerns.   CBN: [phone]

## 2019-12-01 NOTE — Telephone Encounter (Signed)
I called pt back and left a vm that I returned his call and if he is in that much pain he should go back to the ED per Dr. Jimmye Norman and we will not be dispensing any further pain meds as pt is currently under pain management.  Pt was given Toradol and Gabapentin at d/c from the hospital.  Pt also picked up Hydrocodone-Acetamin 7.5-325 mg #120 pills on 11/25/19 prescribed by his pain management doctor.  All pain meds must go through pain management per Dr. Jimmye Norman.    Candy Sledge, CMA

## 2019-12-01 NOTE — Telephone Encounter (Signed)
Pt called in and stated that his pain level is at a 10 and his back doctor will not up dose of pain medication and was told to ask urologist who did the surgery for more pain medication. I called the nurse line and LaFaye stated that the pt needs to go to the ER if his pain level is at a 10 and send a message to [facility] in regards to pain meds. I relayed info to the pt and the pt stated that he was not going to go to the ER and would like a call back from the nurse when rx has been sent.

## 2019-12-28 ENCOUNTER — Ambulatory Visit: Attending: Urology | Primary: Physician Assistant

## 2019-12-28 ENCOUNTER — Ambulatory Visit: Admit: 2019-12-28 | Discharge: 2019-12-28 | Attending: Urology | Primary: Physician Assistant

## 2019-12-28 DIAGNOSIS — C642 Malignant neoplasm of left kidney, except renal pelvis: Secondary | ICD-10-CM

## 2019-12-28 LAB — AMB POC URINALYSIS DIP STICK AUTO W/O MICRO
Bilirubin (UA POC): NEGATIVE
Bilirubin, Urine, POC: NEGATIVE
Glucose (UA POC): NEGATIVE
Glucose, Urine, POC: NEGATIVE
Ketones (UA POC): NEGATIVE
Ketones, Urine, POC: NEGATIVE
Leukocyte Esterase, Urine, POC: NEGATIVE
Leukocyte esterase (UA POC): NEGATIVE
Nitrite, Urine, POC: NEGATIVE
Nitrites (UA POC): NEGATIVE
Protein (UA POC): NEGATIVE
Protein, Urine, POC: NEGATIVE
Specific Gravity, Urine, POC: 1.025 NA (ref 1.001–1.035)
Specific gravity (UA POC): 1.025 (ref 1.001–1.035)
Urobilinogen (UA POC): 1 (ref 0.2–1)
Urobilinogen, POC: 1 (ref 0.2–1)
pH (UA POC): 5 (ref 4.6–8.0)
pH, Urine, POC: 5 NA (ref 4.6–8.0)

## 2019-12-28 NOTE — Progress Notes (Signed)
Faxed imaging to [facility]; [place]        CT A, CXR, needed 06-27-2020

## 2019-12-28 NOTE — Progress Notes (Signed)
I called pt and connected them directly to imaging facility from the pt call

## 2019-12-28 NOTE — Progress Notes (Signed)
error 

## 2019-12-28 NOTE — Progress Notes (Signed)
Progress Notes by Renford Dills, MD at 12/28/19 1445                Author: Renford Dills, MD  Service: --  Author Type: Physician       Filed: 01/25/20 0749  Encounter Date: 12/28/2019  Status: Signed          Editor: Renford Dills, MD (Physician)                          UROLOGIC ONCOLOGY RENAL CANCER NEW DIAGNOSIS       Jordan Weiss   12/28/2019           ASSESSMENT:        1. 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     -1.4 cm left renal lesion seen on CT AP 8/15    -Originally planning for left open partial nephrectomy in 5/16. Patient lost to follow up 2/2 losing health insurance. Last seen by Dr. Kandyce Rud on 06/11/18.    -Creatinine:  0.9 (6/21)    -Hgb:   11.1 (6/21)      Current Disease Status: chromophobe RCC s/p LOPNx   Current Treatment plan: surveillance       2. 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 BPH with LUTS- urinary frequency      4. H/o Gastric Bypass    -internal hernia with emergent repair in 1/15      5. S/p Basal cell excision from forehead in 2019      6. Heart issues, utilizes NTG on occasion      7. Morbid obesity      Last Imaging:   1/15 CT AP w cont- bilateral renal cysts, the largest on the right 3 cm.   8/15 CT AP w wo cont- 3 cm right upper pole renal cyst, 1.4 cm hypodensity medial aspect of the mid pole left kidney, ultrasound suggested for further characterization.   8/15 RUS- 2.9 cm simple cyst upper pole right kidney.1.5 cm septated cyst upper pole right kidney. 1.4 cm lesion midpole left kidney.   4/16 CT AP w wo cont- 3 and 1.6 cm right renal cysts and 1.6 cm left midpole lesion. No significant change in the size or enhancement of the kidney lesion.   1/20 CT AP w wo Cont-  1.9 cm lesion arising along the posterior aspect of the left kidney.   3/21 CT AP w wo Cont- Solid appearing and likely enhancing nodule in the left kidney has slightly increased in size and measures 2.2 cm. No evidence of  metastatic disease.    7/21 CT CAP w Cont-Post partial nephrectomy changes of the left kidney. Small dots of gas is seen in  the operative bed which may be no normal??postoperative changes however infection difficult to completely exclude given the presence of gas.          PLAN:        -Reviewed most recent surgical and pathological findings.    -Discussed at length the diagnosis of kidney cancer and life expectancy with this type of cancer. Given patient's PT1a chromophobe RCC, SOC is surveillance due to tendency to be slow growing.    -CBC, BMP today.    -Next imaging: CT and CXR   -RTC in 6 months with CT, CXR, CBC, BMP.         SUBJECTIVE:  Chief Complaint       Patient presents with        ?  Post OP Follow Up        ?  Renal Cell Carcinoma        HPI:   Jordan Weiss is a 49 y.o.  male who returns for further evaluation of T1aNxM0R0 chromophobe RCC s/p LOPN in 6/21 with size of 3.9 cm. Most recent creatinine is 0.9. Disease is currently  stable with current management of surveillance.   Their appetite is good and weight is stable .       ECOG PS: 0      Review of Systems   Constitutional: Fever: No   Skin: Rash: No   HEENT: Hearing difficulty: No   Eyes: Blurred vision: No   Cardiovascular: Chest pain: No   Respiratory: Shortness of breath: No   Gastrointestinal: Nausea/vomiting: No   Musculoskeletal: Back pain: Yes   Chronic   Neurological: Weakness: No   Psychological: Memory loss: No   Comments/additional findings:            Past Medical History:        Diagnosis  Date         ?  CAD (coronary artery disease)       ?  Cancer [facility])            kidney         ?  DDD (degenerative disc disease), lumbar       ?  Diabetes ([place])       ?  Hypertension       ?  Melanoma ([place])            [place]         ?  OA (osteoarthritis) of knee           ?  Renal lesion               Past Surgical History:         Procedure  Laterality  Date          ?  HX BACK SURGERY    07/2019     ?  HX GASTRIC BYPASS    2005     ?   HX GASTRIC BYPASS         ?  HX HERNIA REPAIR    2015     ?  HX ORTHOPAEDIC    2013          fractured back          ?  HX OTHER SURGICAL  Bilateral  2012, 2014          Rotator cuff          ?  HX OTHER SURGICAL  Left  1989          degloved injury          ?  HX SHOULDER ARTHROSCOPY              rotator cuff repair x 2             Allergies        Allergen  Reactions         ?  Heparin Analogues  Shortness of Breath and Angioedema             Current Outpatient Medications        Medication  Sig         ?  atorvastatin (LIPITOR) 20 mg tablet  Take 20 mg by mouth nightly.     ?  glucose blood VI test strips (blood glucose test) strip  50 Each by Other route.     ?  glucose blood VI test strips (True Metrix Glucose Test Strip) strip  50 Each daily.     ?  True Metrix Glucose Test Strip strip  USE 1 STRIP TO CHECK GLUCOSE ONCE DAILY     ?  Blood-Glucose Meter misc  1 Each daily.     ?  cyclobenzaprine (FLEXERIL) 10 mg tablet  Take 10 mg by mouth every eight (8) hours as needed.     ?  docusate sodium (COLACE) 100 mg capsule  Take 100 mg by mouth daily.     ?  HumuLIN 70/30 U-100 KwikPen 100 unit/mL (70-30) inpn  INJECT 20 UNITS SUBCUTANEOUSLY IN THE MORNING WITH BREAKFAST AND 10 UNITS IN THE EVENING WITH DINNER     ?  omeprazole (PRILOSEC) 20 mg capsule  TAKE 1 CAPSULE BY MOUTH ONCE DAILY     ?  multivitamin (ONE A DAY) tablet  Take 1 Tab by mouth daily.     ?  acetaminophen (TylenoL) 325 mg tablet  Take  by mouth every four (4) hours as needed for Pain.     ?  dilTIAZem ER (CARDIZEM CD) 120 mg capsule  TAKE 1 CAPSULE BY MOUTH ONCE DAILY     ?  HYDROcodone-acetaminophen (NORCO) 7.5-325 mg per tablet  Take 1 Tab by mouth three (3) times daily as needed.     ?  aspirin 81 mg chewable tablet  Take 81 mg by mouth daily.     ?  tamsulosin (FLOMAX) 0.4 mg capsule  Take 1 Cap by mouth daily (after dinner).     ?  ENALAPRIL MALEATE PO  Take  by mouth. Pt takes, but does not know dosage     ?  atenolol (TENORMIN) 25 mg  tablet  Take 1 Tab by mouth daily.     ?  hydroCHLOROthiazide (HYDRODIURIL) 25 mg tablet  Take 1 Tab by mouth daily.         ?  nitroglycerin (NITROSTAT) 0.4 mg SL tablet  1 Tab by SubLINGual route every five (5) minutes as needed for Chest Pain. (Patient not taking: Reported on 12/28/2019)          No current facility-administered medications for this visit.             Family History         Problem  Relation  Age of Onset          ?  Diabetes  Mother       ?  Cancer  Mother                breast          ?  Heart Disease  Mother       ?  Cancer  Father                lung          ?  Diabetes  Father       ?  Stroke  Father       ?  Diabetes  Sister       ?  Hypertension  Sister       ?  Osteoporosis  Sister       ?  Cancer  Sister  cervical cancer          ?  Heart Failure  Sister       ?  Hypertension  Sister       ?  Kidney Disease  Brother                had transplant          ?  Hypertension  Brother       ?  Diabetes  Brother       ?  Hypertension  Sister       ?  Diabetes  Brother       ?  Stroke  Brother       ?  Hypertension  Brother       ?  Heart Attack  Brother       ?  Heart Attack  Brother            ?  Hypertension  Brother               Social History          Socioeconomic History         ?  Marital status:  MARRIED              Spouse name:  Not on file         ?  Number of children:  Not on file     ?  Years of education:  Not on file     ?  Highest education level:  Not on file       Tobacco Use         ?  Smoking status:  Former Smoker     ?  Smokeless tobacco:  Never Used       Substance and Sexual Activity         ?  Alcohol use:  Not Currently              Alcohol/week:  0.0 standard drinks             Comment: rare         ?  Drug use:  No     ?  Sexual activity:  Yes              Partners:  Female          Social Determinants of Museum/gallery exhibitions officer Strain:         ?  Difficulty of Paying Living Expenses:        Food Insecurity:         ?  Worried About  Charity fundraiser in the Last Year:      ?  Arboriculturist in the Last Year:        Transportation Needs:         ?  Film/video editor (Medical):      ?  Lack of Transportation (Non-Medical):        Physical Activity:         ?  Days of Exercise per Week:      ?  Minutes of Exercise per Session:        Stress:         ?  Feeling of Stress :        Social Connections:         ?  Frequency of Communication with  Friends and Family:      ?  Frequency of Social Gatherings with Friends and Family:      ?  Attends Religious Services:      ?  Active Member of Clubs or Organizations:      ?  Attends Archivist Meetings:         ?  Marital Status:                 OBJECTIVE:        PHYSICAL EXAM:    Visit Vitals      Ht  6\' 3"  (1.905 m)     Wt  290 lb (131.5 kg)        BMI  36.25 kg/m??           Gen: Patient is in NAD    Pulm: Equal respiratory effort bilaterally    CV: regular rate    Abd: soft, nontender, non-distended    Ext: No clubbing, cyanosis or edema    Neuro: Grossly intact    Skin: warm and dry, flank bulge from incision in RLQ, no hernia or edema in incision area           LABS/IMAGING REVIEWED:     Results for orders placed or performed in visit on 12/28/19     CBC W/O DIFF         Result  Value  Ref Range            WBC  5.0  3.4 - 10.8 x10E3/uL       RBC  3.95 (L)  4.14 - 5.80 x10E6/uL       HGB  11.7 (L)  13.0 - 17.7 g/dL       HCT  34.5 (L)  37.5 - 51.0 %       MCV  87  79 - 97 fL       MCH  29.6  26.6 - 33.0 pg       MCHC  33.9  31.5 - 35.7 g/dL       RDW  13.8  11.6 - 15.4 %       PLATELET  163  150 - 450 D32K0/UR       METABOLIC PANEL, BASIC         Result  Value  Ref Range            Glucose  189 (H)  65 - 99 mg/dL       BUN  14  6 - 24 mg/dL       Creatinine  0.71 (L)  0.76 - 1.27 mg/dL       GFR est non-AA  109  >59 mL/min/1.73       GFR est AA  126  >59 mL/min/1.73       BUN/Creatinine ratio  20  9 - 20       Sodium  140  134 - 144 mmol/L       Potassium  3.9  3.5 - 5.2 mmol/L        Chloride  104  96 - 106 mmol/L       CO2  21  20 - 29 mmol/L       Calcium  8.6 (L)  8.7 - 10.2 mg/dL       AMB POC URINALYSIS DIP STICK AUTO W/O MICRO         Result  Value  Ref Range  Color (UA POC)  Yellow         Clarity (UA POC)  Clear         Glucose (UA POC)  Negative  Negative       Bilirubin (UA POC)  Negative  Negative       Ketones (UA POC)  Negative  Negative       Specific gravity (UA POC)  1.025  1.001 - 1.035       Blood (UA POC)  2+  Negative       pH (UA POC)  5.0  4.6 - 8.0       Protein (UA POC)  Negative  Negative       Urobilinogen (UA POC)  1 mg/dL  0.2 - 1       Nitrites (UA POC)  Negative  Negative            Leukocyte esterase (UA POC)  Negative  Negative        Imaging:   CT CAP w Cont (7/21)-   FINDINGS:     CHEST:     THYROID: Unremarkable     LYMPH NODES: No enlarged lymph nodes     PLEURA: No pleural effusion seen.      HEART: Normal in size. There is no pericardial effusion. Mild calcific coronary disease present.     VASCULATURE/MEDIASTINUM: There is a small??hiatal hernia. Mediastinum is unremarkable otherwise.     LUNGS: No suspicious nodule or mass.  No abnormal opacities.     AIRWAY: Normal.     BONES: No acute osseous findings.         ===============     ABDOMEN/PELVIS:     LIVER, BILIARY: Liver is normal. No??biliary dilation. Gallbladder is unremarkable.      PANCREAS: Normal.     SPLEEN: Normal.     ADRENALS: Normal.     KIDNEYS: Right kidney: Small cyst seen in the upper pole the right kidney and a large cyst seen in the midpole the right kidney measuring 4.5 cm per the right kidney  is otherwise unremarkable.     Left kidney: Post partial nephrectomy in the midpole the left kidney posteriorly. There is small dots of gas in the operative bed. Small amount of fluid seen measuring 3.8 x 2.8 cm suggesting postoperative hematoma  or seroma. No active extravasation seen. Remainder the left kidney is unremarkable.     LYMPH NODES: No enlarged lymph nodes.     VASCULATURE:  Is mild calcific atherosclerosis.     GASTROINTESTINAL TRACT: There is mild colonic wall  thickening??of the rectosigmoid and descending colon which may reflect under distention or mild colitis. In the appendix is normal. No bowel obstruction seen. There is no free air.     There are post gastric bypass changes.     PELVIC  ORGANS: Small dot of gas is seen in the urinary bladder which may reflect instrumentation or emphysematous cystitis. Prostate is normal in size. There is no free fluid.     BONES: No acute or aggressive osseous abnormalities identified.      OTHER: There is stranding in the subcutaneous tissues along the left lateral abdominal wall suggesting superficial soft tissue contusion.     _______________     IMPRESSION     Chest: No acute posttraumatic changes seen.      Abdomen and pelvis: No solid or viscus organ injury. No free fluid or free air.     Post partial nephrectomy changes  of the left kidney. Small dots of gas is seen in the operative bed which may be no normal postoperative changes however infection  difficult to completely exclude given the presence of gas.     Superficial soft tissue contusion along the left lateral abdominal wall.     Mild colonic wall thickening of the rectosigmoid and descending colon which may reflect under distention  or mild colitis.     Gas in the urinary bladder likely related to instrumentation however correlate??with urinalysis to exclude emphysematous cystitis.       Pathology:   Partial nephrectomy (6/15)-   Diagnosis   A) LEFT KIDNEY, UPPER POLE RENAL MASS, PARTIAL NEPHRECTOMY:   ???? - DIAGNOSIS: CHROMOPHOBE RENAL CELL CARCINOMA   ???? - PROCEDURE: PARTIAL NEPHRECTOMY    ???? - SPECIMEN LATERALITY: LEFT   ???? - FOCALITY: UNIFOCAL   ???? - TUMOR SITE: UPPER POLE   ???? - TUMOR SIZE: 3.9 x 3.1 x 2.1 CM   ???? - HISTOLOGIC TYPE: CHROMOPHOBE   ???? - HISTOLOGIC GRADE (WHO/ISUP):  NOT PERFORMED (SEE COMMENT)   ???? - TUMOR EXTENSION/ANATOMIC EXTENT OF TUMOR:   ???? - NECROSIS:  NEGATIVE   ???? - RHABDOID FEATURES: NEGATIVE   ???? - SARCOMATOID FEATURES: NEGATIVE   ???? - MARGINS:   ????  ?? - PARENCHYMAL: NEGATIVE   ????- LYMPHVASCULAR INVASION: NEGATIVE/NOT APPLICABLE   ???? - REGIONAL LYMPH NODES:   ???? ?? ??- NUMBER EXAMINED: NONE   ???? ?? ??- NUMBER INVOLVED: ??NOT APPLICABLE   ????  - PATHOLOGIC FINDINGS IN NON-NEOPLASTIC KIDNEY: MILD VASCULAR THICKENING; MINIMAL PERITUMORAL FIBROSIS; UNREMARKABLE GLOMERULI   ??   B) PERINEPHRIC ADIPOSE TISSUE, EXCISION:   ????- BENIGN ADIPOSE TISSUE   ??    Findings:   Partial nephrectomy 6/21   Findings:   1. single artery and single vein  2. Tumor size: 2.5 cm  3. Number of tumors: 1  4. Nephrometry score:   5. BMI: 42.71  6. Solitary kidney:  No   7. Warm ischemia time: 14 minutes  8. Preoperative creatinine: 0.6 mg/dl  9. Proteinuria?: No      Ardra Kuznicki B. Jimmye Norman, MD, [place]    Urologic Oncology   [facility] [place]       Associate Professor of Urology   Department of Urology   [facility], [place], [place]        Pager:  [phone]   Office:  [phone]      Medical documentation provided by Lucille Passy, medical scribe for Renford Dills, MD on  12/28/2019.                   ICD-10-CM  ICD-9-CM             1.  Renal cell carcinoma of left kidney (HCC)   C64.2  189.0  AMB POC URINALYSIS DIP STICK AUTO W/O MICRO                XR CHEST PA LAT           CT ABD W WO CONT           COLLECTION VENOUS BLOOD,VENIPUNCTURE           CBC W/O DIFF           METABOLIC PANEL, BASIC           CBC W/O DIFF  CANCELED: CBC W/O DIFF           CANCELED: METABOLIC PANEL, BASIC           CANCELED: CBC W/O DIFF           CANCELED: METABOLIC PANEL, BASIC                CANCELED: METABOLIC PANEL, BASIC           A copy of today's office visit with all pertinent imaging results and labs were sent to Lonia Mad, PA for review.

## 2019-12-28 NOTE — Progress Notes (Signed)
Kriston Pasquarello has order for CT ABD & CXR      To be done at [place] by:  PRIOR TO FOLLOW UP APPT    Patient has a follow-up appointment:  Yes  06/27/20    If MRI, does patient have a pacemaker:   No    Order has been placed in connect care:  Yes    Is this a STAT order:  No      Stacy L White

## 2019-12-28 NOTE — Progress Notes (Signed)
I LVM for patient to call [facility] at [phone] to get imaging scheduled.

## 2019-12-28 NOTE — Progress Notes (Signed)
[facility] Epic Note:      12/28: CT ABD W & W/O CONTRAST. NO DX IS ON THE ORDER. PT SPOUSE STATES SHE WILL CALL MD

## 2019-12-28 NOTE — Progress Notes (Signed)
Imaging placed in future folder.  Imaging will be sent out 1 month prior to f/u appt unless noted otherwise; ct and cxr; 06/27/20

## 2019-12-29 LAB — CBC
Hematocrit: 34.5 % — ABNORMAL LOW (ref 37.5–51.0)
Hemoglobin: 11.7 g/dL — ABNORMAL LOW (ref 13.0–17.7)
MCH: 29.6 pg (ref 26.6–33.0)
MCHC: 33.9 g/dL (ref 31.5–35.7)
MCV: 87 fL (ref 79–97)
Platelets: 163 10*3/uL (ref 150–450)
RBC: 3.95 x10E6/uL — ABNORMAL LOW (ref 4.14–5.80)
RDW: 13.8 % (ref 11.6–15.4)
WBC: 5 10*3/uL (ref 3.4–10.8)

## 2019-12-29 LAB — BASIC METABOLIC PANEL
BUN: 14 mg/dL (ref 6–24)
Bun/Cre Ratio: 20 NA (ref 9–20)
CO2: 21 mmol/L (ref 20–29)
Calcium: 8.6 mg/dL — ABNORMAL LOW (ref 8.7–10.2)
Chloride: 104 mmol/L (ref 96–106)
Creatinine: 0.71 mg/dL — ABNORMAL LOW (ref 0.76–1.27)
EGFR IF NonAfrican American: 109 mL/min/{1.73_m2} (ref >59)
GFR African American: 126 mL/min/{1.73_m2} (ref >59)
Glucose: 189 mg/dL — ABNORMAL HIGH (ref 65–99)
Potassium: 3.9 mmol/L (ref 3.5–5.2)
Sodium: 140 mmol/L (ref 134–144)

## 2019-12-29 LAB — METABOLIC PANEL, BASIC
BUN/Creatinine ratio: 20 (ref 9–20)
BUN: 14 mg/dL (ref 6–24)
CO2: 21 mmol/L (ref 20–29)
Calcium: 8.6 mg/dL — ABNORMAL LOW (ref 8.7–10.2)
Chloride: 104 mmol/L (ref 96–106)
Creatinine: 0.71 mg/dL — ABNORMAL LOW (ref 0.76–1.27)
GFR est AA: 126 mL/min/{1.73_m2} (ref >59)
GFR est non-AA: 109 mL/min/{1.73_m2} (ref >59)
Glucose: 189 mg/dL — ABNORMAL HIGH (ref 65–99)
Potassium: 3.9 mmol/L (ref 3.5–5.2)
Sodium: 140 mmol/L (ref 134–144)

## 2019-12-29 LAB — CBC W/O DIFF
HCT: 34.5 % — ABNORMAL LOW (ref 37.5–51.0)
HGB: 11.7 g/dL — ABNORMAL LOW (ref 13.0–17.7)
MCH: 29.6 pg (ref 26.6–33.0)
MCHC: 33.9 g/dL (ref 31.5–35.7)
MCV: 87 fL (ref 79–97)
PLATELET: 163 10*3/uL (ref 150–450)
RBC: 3.95 x10E6/uL — ABNORMAL LOW (ref 4.14–5.80)
RDW: 13.8 % (ref 11.6–15.4)
WBC: 5 10*3/uL (ref 3.4–10.8)

## 2020-02-06 ENCOUNTER — Encounter

## 2020-02-14 MED ORDER — TAMSULOSIN SR 0.4 MG 24 HR CAP
0.4 mg | ORAL_CAPSULE | Freq: Every day | ORAL | 1 refills | Status: DC
Start: 2020-02-14 — End: 2021-03-11

## 2020-05-21 ENCOUNTER — Encounter

## 2020-06-12 ENCOUNTER — Encounter

## 2020-06-21 ENCOUNTER — Encounter: Primary: Physician Assistant

## 2020-06-26 ENCOUNTER — Encounter

## 2020-06-26 NOTE — Progress Notes (Signed)
Imaging placed in future folder.  Imaging will be sent out 1 month prior to f/u appt unless noted otherwise         CT A, CXR, needed 08-15-2020

## 2020-06-26 NOTE — Progress Notes (Signed)
Stoy Fenn has order for CT ABD & CXR     To be done at [place] by:  [place]- [facility] APPT    Patient has a follow-up appointment:  Yes  August 15, 2020    If MRI, does patient have a pacemaker:   No    Order has been placed in connect care:  Yes  (DX code: C64.2)     Is this a STAT order:  No      Gearldine Shown

## 2020-06-27 ENCOUNTER — Encounter: Attending: Urology | Primary: Physician Assistant

## 2020-07-17 NOTE — Progress Notes (Signed)
I LVM for patient to call [facility] at [phone] to get imaging scheduled.

## 2020-07-17 NOTE — Progress Notes (Signed)
Faxed imaging to [facility]; [place] [phone]        CT AP, CXR, needed 08-15-2020

## 2020-07-17 NOTE — Progress Notes (Signed)
2nd pt call:      I LVM for patient to call [facility] at [phone] to get imaging scheduled.

## 2020-08-15 ENCOUNTER — Encounter: Attending: Urology | Primary: Physician Assistant

## 2020-09-27 NOTE — Addendum Note (Signed)
Addended by: Nira Conn on: 09/28/2020 06:19 PM     Modules accepted: Orders      Electronically signed by Zenda Alpers, PA at 09/28/2020  6:19 PM EDT

## 2020-09-27 NOTE — Progress Notes (Signed)
Formatting of this note is different from the original.  Comprehensive Evaluation- Service Date: 09/27/20  And in person visit time spent during the visit 39 minutes time included reviewing the chart taking the patient history and exam.  Assessment & Plan     1. Essential hypertension  - enalapril (VASOTEC) 20 mg PO TABS; Take 1 Tab by Mouth Once a Day.  Dispense: 90 Tab; Refill: 3  - nitroglycerin (NITROSTAT) 0.4 mg SL SUBL; DISSOLVE ONE TABLET UNDER THE TONGUE EVERY 5 MINUTES AS NEEDED FOR CHEST PAIN.  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 Colon cancer screening  - Referral To Gastroenterology    4. Prostate cancer screening  - PSA SCREEN; Future          No follow-ups on file.     Chief Complaint       Patient presents with   ? ABDOMINAL PAIN     Pt c/o stomach burning after eating x6 months now. Pain today 9     History of Present Illness     Time in clinic finish this chart 6-hour this is a 50 year old male presents for follow-up essential hypertension type 2 diabetes colon cancer screening and prostate cancer screening.    Prostate cancer screening  PSA ordered the patient is currently asymptomatic.    Colon cancer screening  The patient is due for screening colonoscopy colonoscopy ordered today.    Essential hypertension  Medications refilled today, history of leg swelling in the past and recently ordering echocardiogram for further evaluation also history of chest pain as well.  No chest pain today.  EKG will be ordered as well along with chest x-ray.    Gastroesophageal reflux disease  No changes will be made to the current treatment plan.  Review of Systems   Review of Systems    Constitutional: Negative for activity change, appetite change, chills, diaphoresis, fatigue, fever and unexpected weight change.   Respiratory: Negative for apnea, cough, choking, chest tightness, shortness of breath, wheezing and stridor.    Cardiovascular: Positive for leg swelling. Negative for chest pain and palpitations.   Psychiatric/Behavioral: Negative for agitation, behavioral problems, confusion, decreased concentration, dysphoric mood, hallucinations, self-injury, sleep disturbance and suicidal ideas. The patient is not nervous/anxious and is not hyperactive.        Home Medications     Outpatient Medications Marked as Taking for the 09/27/20 encounter (Office Visit) with Zenda AlpersMorrison, William, PA   Medication Sig Dispense Refill   ? aspirin 81 mg PO CHEW Take 81 mg by Mouth.     ? atenoloL (TENORMIN) 25 mg PO TABS Take 1 tablet by mouth once daily 90 Tab 11   ? atorvastatin (LIPITOR) 20 mg PO TABS TAKE 1 TABLET BY MOUTH EVERY DAY AT BEDTIME 30 Tab 2   ? Blood Sugar Diagnostic (BLOOD GLUCOSE TEST) Misc STRP 50 Each as directed As Directed. Please provide glucose test strips that the patient's insurance will cover at the pharmacist discretion. 1 Box 11   ? Blood Sugar Diagnostic (TRUE METRIX GLUCOSE TEST STRIP) Misc STRP 50 Each by Misc.(Non-Drug; Combo  Route) route Once a Day. 1 Box 6   ? Blood-Glucose Meter (BLOOD GLUCOSE MONITORING) Misc KIT Please at the pharmacist discretion provided glucose meter test strips and lancets that the patient's insurance will cover.  I do not have the information as to what meter the patient's insurance will pay for but any glucometer will be fine that the insurance will cover.  The patient is to test 3 times a day. 1 Kit 0   ? Blood-Glucose Meter (BLOOD GLUCOSE MONITORING) Misc KIT 1 Each by Misc.(Non-Drug; Combo Route) route Once a Day. 1 Kit 0   ? Blood-Glucose Meter (TRUE METRIX GLUCOSE METER) Misc MISC 1 Each by Misc.(Non-Drug; Combo Route) route Once a Day. E11.9 1 Each  0   ? dilTIAZem (TIAZAC) 120 mg PO Cs24 Take 1 capsule by mouth once daily 90 Cap 2   ? docusate sodium (COLACE) 100 mg PO CAPS Take 1 Cap by Mouth Once a Day. 90 Cap 3   ? enalapril (VASOTEC) 20 mg PO TABS Take 1 Tab by Mouth Once a Day. 90 Tab 3   ? fluticasone propion-salmeteroL (ADVAIR) 250-50 mcg/dose INH DsDv Take 1 Puff inhaled by mouth Every 12 hours. 60 Each 3   ? gabapentin (NEURONTIN) 300 mg PO CAPS Take 1 Cap by Mouth 3 Times Daily. 270 Cap 2   ? hydroCHLOROthiazide (HYDRODIURIL) 25 mg PO TABS Take 1 tablet by mouth once daily 90 Tab 2   ? [DISCONTINUED] INSULIN HUMAN U-100 NPH-REGULR 70-30 MIX 100 UNIT/ML SUBCUTANEOUS SUSP 20 units q am w/ breakfast, 10 units q pm w/ dinner 3 mL 5   ? Insulin Needles, Disposable, (LITE TOUCH INSULIN PEN NEEDLES) 31 gauge x 1/4" Misc Ndle 1 Each by Misc.(Non-Drug; Combo Route) route 2 (two) times a day. 100 Each 11   ? insulin syringe-needle U-100 1 mL 31 gauge x 15/64" Misc Syrg 1 Applicator by Misc.(Non-Drug; Combo Route) route 3 Times Daily. 100 Each 11   ? Lancets Misc MISC 1Please provide lancets at the pharmacist discretion of the patient's insurance will cover 1 Each 11   ? Lancets Misc MISC 1 Each by Misc.(Non-Drug; Combo Route) route As Directed. 1 Each 11   ? lithium (ESKALITH) 300 mg PO TABS Take 1 Tab by Mouth 3 Times Daily. 90 Tab 11   ? methocarbamoL (ROBAXIN) 500 mg PO TABS Take 1-2 Tabs by Mouth 3 Times Daily As Needed. 30 Tab 0   ? mv-mins/folic/lycopene/ginkgo (ONE-A-DAY MEN'S 50+ ADVANTAGE PO) Take 1 Tab by Mouth.     ? nitroglycerin (NITROSTAT) 0.4 mg SL SUBL DISSOLVE ONE TABLET UNDER THE TONGUE EVERY 5 MINUTES AS NEEDED FOR CHEST PAIN.  DO NOT EXCEED A TOTAL OF 3 DOSES IN 15 MINUTES 50 Tab 1   ? omeprazole (PRILOSEC) 20 mg PO CPDR Take 1 Cap by Mouth Once a Day. 90 Cap 3   ? Omeprazole 40 mg PO CPDR Take 1 Cap by Mouth Once a Day. 90 Cap 3   ? sucralfate (CARAFATE) 100 mg/mL PO suspension Take 10 mL by Mouth 4 Times a Day Before Meals & at Bedtime.  420 mL 1   ? tamsulosin HCl (FLOMAX PO) Take  by Mouth.     ? traZODone 150 mg PO TABS Take 1 Tab by Mouth 2 (two) times a day. 90 Tab 11     Allergies     Allergies   Allergen Reactions   ? Heparin anaphylaxis/angioedema     Past Surgical History  Past Surgical History:   Procedure Laterality Date   ? BACK SURGERY  12/2018   ? COLONOSCOPY     ? GASTRIC BYPASS     ? HERNIA REPAIR     ? NEPHRECTOMY PARTIAL Left 11/29/2019    Procedure: NEPHRECTOMY, PARTIAL;  Surgeon: Elnita Maxwell, MD   ? ORTHOPEDIC SURGERY     ? ROTATOR CUFF REPAIR         Past Medical History     Past Medical History:   Diagnosis Date   ? Arthritis 1995   ? Atrial fibrillation (HCC)     pt states it was mentioned 3 years ago   ? Cancer of kidney (HCC)    ? Decreased exercise tolerance     SOB   ? Depression 1993   ? Heart attack (HCC) 1996   ? Hiatal hernia 2014   ? History of emotional problems    ? Hypertension    ? Loose, teeth     MISSING TEETH AND A BROKEN TOOTH   ? PONV (postoperative nausea and vomiting)    ? WPW (Wolff-Parkinson-White syndrome)      Family History     Family History   Problem Relation Age of Onset   ? Heart Disease Father    ? Hypertension Father    ? Lung Cancer Father    ? Colon Polyp Father    ? Stroke Mother    ? Cancer, Breast Mother    ? Diabetes Mother    ? Hypertension Mother    ? Colon Polyp Mother    ? Heart Disease Mother      Social History     Social History     Occupational History   ? Not on file   Tobacco Use   ? Smoking status: Former Smoker     Quit date: 2021     Years since quitting: 1.2   ? Smokeless tobacco: Former Neurosurgeon     Quit date: 04/18/2015   Vaping Use   ? Vaping Use: Never used   Substance and Sexual Activity   ? Alcohol use: No     Alcohol/week: 0.0 standard drinks   ? Drug use: No   ? Sexual activity: Not on file     The patient's medical, family, and social history (including tobacco usage) were reviewed and updated as appropriate.    Tobacco History reviewed:  Social History      Tobacco Use   Smoking Status Former Smoker   ? Quit date: 2021   ? Years since quitting: 1.2   Smokeless Tobacco Former Neurosurgeon   ? Quit date: 04/18/2015     Counseling given: No    Physical Exam   Vital Signs: BP 136/79   Pulse 78   Temp 98.1 F (36.7 C) (Skin)   Resp 14   Ht 6\' 3"  (1.905 m)   Wt (!) 154.2 kg (340 lb)   SpO2 99%   BMI 42.50 kg/m      Physical Exam  Constitutional:       General: He is not in acute distress.     Appearance: Normal appearance. He is not ill-appearing, toxic-appearing or diaphoretic.   Cardiovascular:      Rate and Rhythm: Normal rate and regular rhythm.      Pulses: Normal pulses.      Heart sounds: Normal heart sounds. No murmur heard.    No friction rub. No gallop.   Pulmonary:  Effort: Pulmonary effort is normal. No respiratory distress.      Breath sounds: Normal breath sounds. No stridor. No wheezing, rhonchi or rales.   Chest:      Chest wall: No tenderness.   Neurological:      Mental Status: He is alert.   Psychiatric:         Mood and Affect: Mood normal.         Behavior: Behavior normal.         Thought Content: Thought content normal.         Judgment: Judgment normal.         Recent Results & Studies   Recent Labs:  Results for orders placed or performed during the hospital encounter of 09/04/20   EDIE VALUES   Result Value Ref Range    EDIE SECURITY      EDIE PDMP 1     EDIE CAREPLAN      EDIE FREQUENCY 1     EDIE FACILITY           I have reviewed information entered by the clinical staff and/or patient and verified it as accurate or edited where necessary.    Signature:  Zenda Alpers, [place]  [facility]  Dept: [phone]  Dept Fax: [phone]    Electronically signed by Zenda Alpers, PA at 09/27/2020  3:39 PM EDT

## 2020-11-07 ENCOUNTER — Inpatient Hospital Stay
Admit: 2020-11-07 | Discharge: 2020-11-08 | Disposition: A | Discharge status: Home / Self Care | Payer: MEDICAID | Attending: Emergency Medicine

## 2020-11-07 DIAGNOSIS — B349 Viral infection, unspecified: Secondary | ICD-10-CM

## 2020-11-07 NOTE — ED Notes (Signed)
Pt states he has had nausea, diarrhea, and fever x2 days. Pt states he has been taking otc tylenol for fever and started imodium today.

## 2020-11-07 NOTE — ED Provider Notes (Signed)
ED Provider Notes by Darrin Luis, MD at 11/07/20 2043                Author: Darrin Luis, MD  Service: EMERGENCY  Author Type: Physician       Filed: 11/07/20 2057  Date of Service: 11/07/20 2043  Status: Signed          Editor: Melis Trochez, Myrle Sheng, MD (Physician)               EMERGENCY DEPARTMENT HISTORY AND PHYSICAL EXAM           Date: 11/07/2020   Patient Name: Jordan Weiss        History of Presenting Illness          Chief Complaint       Patient presents with        ?  Nausea        ?  Diarrhea           History Provided By: Patient      HPI: Davonn Flanery,  50 y.o. male with a past medical history significant  diabetes, hypertension and Coronary artery disease, states  had angioplasty many years ago, WPW, osteoarthritis, presents to the ED with cc of "thinks had a virus".  Patient thinks  he had a virus last  a few days.  He states that he had nausea, vomiting and diarrhea.  Also had fever, 100.1 about 3 days ago.  Feels rundown and is here because he has an appointment with his PCP a week from now and wants a work note.  The moment he states that he feels  okay.  He does not want any blood work, just wants medications and work note.  He states he had COVID January this year, has not had any test for COVID since.  Not been vaccinated.  Denies any exposure to flu.      There are no other complaints, changes, or physical findings at this time.      PCP: Lonia Mad, PA        No current facility-administered medications on file prior to encounter.          Current Outpatient Medications on File Prior to Encounter          Medication  Sig  Dispense  Refill           ?  gabapentin (NEURONTIN) 100 mg capsule  Take  by mouth three (3) times daily.         ?  fluticasone propion-salmeteroL (Advair Diskus) 100-50 mcg/dose diskus inhaler  Take 1 Puff by inhalation every twelve (12) hours.         ?  tamsulosin (FLOMAX) 0.4 mg capsule  Take 1 Capsule by mouth daily (after  dinner). Indications: enlarged prostate with urination problem  90 Capsule  1     ?  atorvastatin (LIPITOR) 20 mg tablet  Take 20 mg by mouth nightly.         ?  glucose blood VI test strips (blood glucose test) strip  50 Each by Other route.         ?  glucose blood VI test strips (True Metrix Glucose Test Strip) strip  50 Each daily.         ?  True Metrix Glucose Test Strip strip  USE 1 STRIP TO CHECK GLUCOSE ONCE DAILY         ?  Blood-Glucose Meter misc  1 Each daily.         ?  cyclobenzaprine (FLEXERIL) 10 mg tablet  Take 10 mg by mouth every eight (8) hours as needed.         ?  docusate sodium (COLACE) 100 mg capsule  Take 100 mg by mouth daily.         ?  HumuLIN 70/30 U-100 KwikPen 100 unit/mL (70-30) inpn  INJECT 20 UNITS SUBCUTANEOUSLY IN THE MORNING WITH BREAKFAST AND 10 UNITS IN THE EVENING WITH DINNER         ?  omeprazole (PRILOSEC) 20 mg capsule  TAKE 1 CAPSULE BY MOUTH ONCE DAILY         ?  multivitamin (ONE A DAY) tablet  Take 1 Tab by mouth daily.         ?  acetaminophen (TylenoL) 325 mg tablet  Take  by mouth every four (4) hours as needed for Pain.         ?  dilTIAZem ER (CARDIZEM CD) 120 mg capsule  TAKE 1 CAPSULE BY MOUTH ONCE DAILY         ?  HYDROcodone-acetaminophen (NORCO) 7.5-325 mg per tablet  Take 1 Tab by mouth three (3) times daily as needed.         ?  [DISCONTINUED] aspirin 81 mg chewable tablet  Take 81 mg by mouth daily.         ?  ENALAPRIL MALEATE PO  Take  by mouth. Pt takes, but does not know dosage         ?  atenolol (TENORMIN) 25 mg tablet  Take 1 Tab by mouth daily.  30 Tab  3     ?  nitroglycerin (NITROSTAT) 0.4 mg SL tablet  1 Tab by SubLINGual route every five (5) minutes as needed for Chest Pain. (Patient not taking: Reported on 12/28/2019)  1 Bottle  prn           ?  hydroCHLOROthiazide (HYDRODIURIL) 25 mg tablet  Take 1 Tab by mouth daily.  30 Tab  3             Past History        Past Medical History:     Past Medical History:        Diagnosis  Date         ?   CAD (coronary artery disease)       ?  Cancer [facility])            kidney         ?  DDD (degenerative disc disease), lumbar       ?  Diabetes ([place])       ?  Hypertension       ?  Melanoma ([place])            [place]         ?  OA (osteoarthritis) of knee           ?  Renal lesion             Past Surgical History:     Past Surgical History:         Procedure  Laterality  Date          ?  HX BACK SURGERY    07/2019     ?  HX GASTRIC BYPASS    2005     ?  HX GASTRIC BYPASS         ?  HX HERNIA REPAIR    2015     ?  HX ORTHOPAEDIC    2013          fractured back          ?  HX OTHER SURGICAL  Bilateral  2012, 2014          Rotator cuff          ?  HX OTHER SURGICAL  Left  1989          degloved injury          ?  HX SHOULDER ARTHROSCOPY              rotator cuff repair x 2           Family History:     Family History         Problem  Relation  Age of Onset          ?  Diabetes  Mother       ?  Cancer  Mother                breast          ?  Heart Disease  Mother       ?  Cancer  Father                lung          ?  Diabetes  Father       ?  Stroke  Father       ?  Diabetes  Sister       ?  Hypertension  Sister       ?  Osteoporosis  Sister       ?  Cancer  Sister                cervical cancer          ?  Heart Failure  Sister       ?  Hypertension  Sister       ?  Kidney Disease  Brother                had transplant          ?  Hypertension  Brother       ?  Diabetes  Brother       ?  Hypertension  Sister       ?  Diabetes  Brother       ?  Stroke  Brother       ?  Hypertension  Brother       ?  Heart Attack  Brother       ?  Heart Attack  Brother            ?  Hypertension  Brother             Social History:     Social History          Tobacco Use         ?  Smoking status:  Former Smoker     ?  Smokeless tobacco:  Never Used       Substance Use Topics         ?  Alcohol use:  Not Currently              Alcohol/week:  0.0 standard drinks             Comment: rare         ?  Drug use:  No           Allergies:      Allergies        Allergen  Reactions         ?  Heparin Analogues  Shortness of Breath and Angioedema                Review of Systems        Review of Systems    Constitutional: Positive for fatigue. Negative for chills and fever.    HENT: Negative for congestion and sore throat.     Respiratory: Negative for cough and shortness of breath.     Cardiovascular: Negative for chest pain and palpitations.    Gastrointestinal: Positive for diarrhea, nausea  and vomiting. Negative for abdominal pain.    Genitourinary: Negative for dysuria, flank pain and frequency.    Musculoskeletal: Negative for arthralgias, joint swelling and myalgias.    Skin: Negative for color change and wound.    Neurological: Negative for dizziness, weakness, light-headedness and headaches.    Hematological: Negative for adenopathy.            Physical Exam        Physical Exam   Vitals and nursing note reviewed.   Constitutional:        General: He is not in acute distress.     Appearance: He is well-developed. He is obese. He is not diaphoretic.      Comments:  Obese male, ambulated into ED comfortably.  His phone in hand.  Was on the phone when I walked in the room.   HENT:       Head: Normocephalic and atraumatic. No right periorbital erythema or left periorbital erythema.      Jaw: No trismus.      Right Ear: External ear normal. No drainage or swelling. Tympanic membrane is not perforated, erythematous or bulging.       Left Ear: External ear normal. No drainage or swelling. Tympanic membrane is not perforated, erythematous or bulging.      Nose: Nose normal. No mucosal edema or rhinorrhea.      Right Sinus: No maxillary sinus tenderness or frontal sinus tenderness.       Left Sinus: No maxillary sinus tenderness or frontal sinus tenderness.      Mouth/Throat:      Mouth: No oral lesions.      Dentition: No dental abscesses.      Pharynx: Uvula  midline. No oropharyngeal exudate, posterior oropharyngeal erythema or uvula swelling.       Tonsils: No tonsillar abscesses.   Eyes:       General: No scleral icterus.        Right eye: No discharge.         Left eye: No discharge.      Conjunctiva/sclera: Conjunctivae normal.   Cardiovascular :       Rate and Rhythm: Normal rate and regular rhythm.      Heart sounds: Normal heart sounds. No murmur heard.   No friction rub. No gallop.     Pulmonary:       Effort: Pulmonary effort is normal. No tachypnea, accessory muscle usage or respiratory distress.      Breath sounds: Normal breath sounds . No decreased breath sounds, wheezing, rhonchi or rales.   Abdominal:      General: Bowel sounds are normal. There is no distension.       Palpations: Abdomen is soft.  Tenderness: There is no abdominal tenderness.      Comments: Abdomen is morbidly obese but soft and nontender.      Musculoskeletal:          General: No tenderness. Normal range of motion.      Cervical back: Normal range of motion and neck supple.     Lymphadenopathy:       Cervical: No cervical adenopathy.   Skin :      General: Skin is warm and dry.   Neurological :       Mental Status: He is alert and oriented to person, place, and time.    Psychiatric:         Judgment: Judgment normal.               Lab and Diagnostic Study Results        Labs -    No results found for this or any previous visit (from the past 12 hour(s)).      Radiologic Studies -    @lastxrresult @     CT Results   (Last 48 hours)          None                 CXR Results   (Last 48 hours)          None                       Medical Decision Making     - I am the first provider for this patient.      - I reviewed the vital signs, available nursing notes, past medical history, past surgical history, family history and social history.      - Initial assessment performed. The patients presenting problems have been discussed, and they are in agreement with the care plan formulated and outlined with them.  I have encouraged them to ask questions as they arise throughout their  visit.      Vital Signs-Reviewed the patient's vital signs.   Patient Vitals for the past 12 hrs:            Temp  Pulse  Resp  BP  SpO2            11/07/20 1957  97.3 ??F (36.3 ??C)  97  18  (!) 154/86  98 %           Records Reviewed: Nursing Notes and Old Medical Records      The patient presents with nausea vomiting and diarrhea, thinks it is a virus.         ED Course:    Patient appears stable, vital signs normal, does not want any blood work done.  Convince patient he needed some since he is diabetic and his other medical problems but he states he thinks he is going to be okay just wants medications and work note.          Provider Notes (Medical Decision Making):         Procedures     Medical Decision Makingedical Decision Making        Disposition     Disposition: Condition stable   DC- Adult Discharges: All of the diagnostic tests were reviewed and questions answered. Diagnosis, care plan and treatment options were discussed.  The patient understands the instructions and will follow up as directed. The patients results have been  reviewed with them.  They have been counseled  regarding their diagnosis.  The patient verbally convey understanding and agreement of the signs, symptoms, diagnosis, treatment and prognosis and additionally agrees to follow up as recommended with their  PCP in 24 - 48 hours.  They also agree with the care-plan and convey that all of their questions have been answered.  I have also put together some discharge instructions for them that include: 1) educational information regarding their diagnosis, 2)  how to care for their diagnosis at home, as well a 3) list of reasons why they would want to return to the ED prior to their follow-up appointment, should their condition change.      Diagnosis:       1.  Viral illness         2.  Vomiting and diarrhea               Disposition:         Follow-up Information               Follow up With  Specialties  Details  Why  Contact Info               Lonia Mad, [place]  Physician Assistant  In 2 days    [address]   [place]   [place] [address]   [phone]                 Lonia Mad, [place]  Physician Assistant    If symptoms worsen  [place]   [place] [address]   [phone]                 [facility] EMERGENCY DEPT  Emergency Medicine    If symptoms worsen  [place]   [phone]                  Patient's Medications       Start Taking           LOPERAMIDE (IMODIUM) 2 MG CAPSULE     Take 1 Capsule by mouth four (4) times daily as needed for Diarrhea for up to 10 days.           ONDANSETRON HCL (ZOFRAN) 4 MG TABLET     Take 1 Tablet by mouth every eight (8) hours as needed for Nausea.       Continue Taking           ACETAMINOPHEN (TYLENOL) 325 MG TABLET     Take  by mouth every four (4) hours as needed for Pain.           ATENOLOL (TENORMIN) 25 MG TABLET     Take 1 Tab by mouth daily.           ATORVASTATIN (LIPITOR) 20 MG TABLET     Take 20 mg by mouth nightly.           BLOOD-GLUCOSE METER MISC     1 Each daily.           CYCLOBENZAPRINE (FLEXERIL) 10 MG TABLET     Take 10 mg by mouth every eight (8) hours as needed.           DILTIAZEM ER (CARDIZEM CD) 120 MG CAPSULE     TAKE 1 CAPSULE BY MOUTH ONCE DAILY           DOCUSATE SODIUM (COLACE) 100 MG CAPSULE     Take 100 mg  by mouth daily.           ENALAPRIL MALEATE PO     Take  by mouth. Pt takes, but does not know dosage           FLUTICASONE PROPION-SALMETEROL (ADVAIR DISKUS) 100-50 MCG/DOSE DISKUS INHALER     Take 1 Puff by inhalation every twelve (12) hours.           GABAPENTIN (NEURONTIN) 100 MG CAPSULE     Take  by mouth three (3) times daily.           GLUCOSE BLOOD VI TEST STRIPS (BLOOD GLUCOSE TEST) STRIP     50 Each by Other route.           GLUCOSE BLOOD VI TEST STRIPS (TRUE METRIX GLUCOSE TEST STRIP) STRIP     50 Each daily.           HUMULIN 70/30 U-100 KWIKPEN 100 UNIT/ML (70-30) INPN     INJECT 20 UNITS SUBCUTANEOUSLY IN THE  MORNING WITH BREAKFAST AND 10 UNITS IN THE EVENING WITH DINNER           HYDROCHLOROTHIAZIDE (HYDRODIURIL) 25 MG TABLET     Take 1 Tab by mouth daily.           HYDROCODONE-ACETAMINOPHEN (NORCO) 7.5-325 MG PER TABLET     Take 1 Tab by mouth three (3) times daily as needed.           MULTIVITAMIN (ONE A DAY) TABLET     Take 1 Tab by mouth daily.           NITROGLYCERIN (NITROSTAT) 0.4 MG SL TABLET     1 Tab by SubLINGual route every five (5) minutes as needed for Chest Pain.           OMEPRAZOLE (PRILOSEC) 20 MG CAPSULE     TAKE 1 CAPSULE BY MOUTH ONCE DAILY           TAMSULOSIN (FLOMAX) 0.4 MG CAPSULE     Take 1 Capsule by mouth daily (after dinner). Indications: enlarged prostate with urination problem           TRUE METRIX GLUCOSE TEST STRIP STRIP     USE 1 STRIP TO CHECK GLUCOSE ONCE DAILY       These Medications have changed          No medications on file       Stop Taking           ASPIRIN 81 MG CHEWABLE TABLET     Take 81 mg by mouth daily.           DISCHARGE PLAN:   1.      Current Discharge Medication List              START taking these medications          Details        ondansetron hcl (Zofran) 4 mg tablet  Take 1 Tablet by mouth every eight (8) hours as needed for Nausea.   Qty: 10 Tablet, Refills: 0               loperamide (IMODIUM) 2 mg capsule  Take 1 Capsule by mouth four (4) times daily as needed for Diarrhea for up to 10 days.   Qty: 20 Capsule, Refills: 0                     CONTINUE these medications which have NOT CHANGED  Details        gabapentin (NEURONTIN) 100 mg capsule  Take  by mouth three (3) times daily.               fluticasone propion-salmeteroL (Advair Diskus) 100-50 mcg/dose diskus inhaler  Take 1 Puff by inhalation every twelve (12) hours.               tamsulosin (FLOMAX) 0.4 mg capsule  Take 1 Capsule by mouth daily (after dinner). Indications: enlarged prostate with urination problem   Qty: 90 Capsule, Refills: 1          Comments: Pt has appt with Urology in  January- will need to keep to get more refills   Associated Diagnoses: Benign prostatic hyperplasia with lower urinary tract symptoms, symptom details unspecified               atorvastatin (LIPITOR) 20 mg tablet  Take 20 mg by mouth nightly.               !! glucose blood VI test strips (blood glucose test) strip  50 Each by Other route.               !! glucose blood VI test strips (True Metrix Glucose Test Strip) strip  50 Each daily.               !! True Metrix Glucose Test Strip strip  USE 1 STRIP TO CHECK GLUCOSE ONCE DAILY               Blood-Glucose Meter misc  1 Each daily.               cyclobenzaprine (FLEXERIL) 10 mg tablet  Take 10 mg by mouth every eight (8) hours as needed.               docusate sodium (COLACE) 100 mg capsule  Take 100 mg by mouth daily.               HumuLIN 70/30 U-100 KwikPen 100 unit/mL (70-30) inpn  INJECT 20 UNITS SUBCUTANEOUSLY IN THE MORNING WITH BREAKFAST AND 10 UNITS IN THE EVENING WITH DINNER               omeprazole (PRILOSEC) 20 mg capsule  TAKE 1 CAPSULE BY MOUTH ONCE DAILY               multivitamin (ONE A DAY) tablet  Take 1 Tab by mouth daily.               acetaminophen (TylenoL) 325 mg tablet  Take  by mouth every four (4) hours as needed for Pain.               dilTIAZem ER (CARDIZEM CD) 120 mg capsule  TAKE 1 CAPSULE BY MOUTH ONCE DAILY               HYDROcodone-acetaminophen (NORCO) 7.5-325 mg per tablet  Take 1 Tab by mouth three (3) times daily as needed.               ENALAPRIL MALEATE PO  Take  by mouth. Pt takes, but does not know dosage               atenolol (TENORMIN) 25 mg tablet  Take 1 Tab by mouth daily.   Qty: 30 Tab, Refills: 3          Associated Diagnoses: Essential hypertension  nitroglycerin (NITROSTAT) 0.4 mg SL tablet  1 Tab by SubLINGual route every five (5) minutes as needed for Chest Pain.   Qty: 1 Bottle, Refills: prn          Associated Diagnoses: Coronary artery disease involving native heart without angina pectoris,  unspecified  vessel or lesion type               hydroCHLOROthiazide (HYDRODIURIL) 25 mg tablet  Take 1 Tab by mouth daily.   Qty: 30 Tab, Refills: 3          Associated Diagnoses: Essential hypertension               !! - Potential duplicate medications found. Please discuss with provider.               2.      Follow-up Information               Follow up With  Specialties  Details  Why  Contact Info              Lonia Mad, [place]  Physician Assistant  In 2 days    [address]   [place]   [place] [address]   [phone]                 Lonia Mad, [place]  Physician Assistant    If symptoms worsen  [place]   [place] [address]   [phone]                 [facility] EMERGENCY DEPT  Emergency Medicine    If symptoms worsen  [place]   [phone]             3.  Return to ED if worse    4.      Current Discharge Medication List              START taking these medications          Details        ondansetron hcl (Zofran) 4 mg tablet  Take 1 Tablet by mouth every eight (8) hours as needed for Nausea.   Qty: 10 Tablet, Refills:  0   Start date: 11/07/2020               loperamide (IMODIUM) 2 mg capsule  Take 1 Capsule by mouth four (4) times daily as needed for Diarrhea for up to 10 days.   Qty: 20 Capsule, Refills:  0   Start date: 11/07/2020, End date:  11/17/2020                              Diagnosis        Clinical Impression:       1.  Viral illness         2.  Vomiting and diarrhea            Attestations:      Margarie Mcguirt S Nalani Andreen, MD      Please note that this dictation was completed with Dragon, the computer voice recognition software.  Quite often unanticipated grammatical, syntax, homophones, and other interpretive errors are inadvertently  transcribed by the computer software.  Please disregard these errors.  Please excuse any errors that have escaped final proofreading.  Thank you.

## 2020-11-08 MED ORDER — LOPERAMIDE 2 MG CAP
2 mg | ORAL_CAPSULE | Freq: Four times a day (QID) | ORAL | 0 refills | Status: AC | PRN
Start: 2020-11-08 — End: 2020-11-17

## 2020-11-08 MED ORDER — ONDANSETRON HCL 4 MG TAB
4 mg | ORAL_TABLET | Freq: Three times a day (TID) | ORAL | 0 refills | Status: AC | PRN
Start: 2020-11-08 — End: 2021-03-11

## 2020-11-08 MED ORDER — DICYCLOMINE 10 MG CAP
10 mg | ORAL | Status: AC
Start: 2020-11-08 — End: 2020-11-07
  Administered 2020-11-08: via ORAL

## 2020-11-08 MED ORDER — ONDANSETRON 4 MG TAB, RAPID DISSOLVE
4 mg | ORAL | Status: AC
Start: 2020-11-08 — End: 2020-11-07
  Administered 2020-11-08: via SUBLINGUAL

## 2020-11-08 MED FILL — DICYCLOMINE 10 MG CAP: 10 mg | ORAL | Qty: 2

## 2020-11-08 MED FILL — ONDANSETRON 4 MG TAB, RAPID DISSOLVE: 4 mg | ORAL | Qty: 1

## 2020-12-01 ENCOUNTER — Encounter

## 2021-03-11 ENCOUNTER — Inpatient Hospital Stay
Admit: 2021-03-11 | Discharge: 2021-03-11 | Disposition: A | Discharge status: Home / Self Care | Payer: MEDICAID | Attending: Emergency Medicine

## 2021-03-11 DIAGNOSIS — M545 Low back pain, unspecified: Secondary | ICD-10-CM

## 2021-03-11 NOTE — ED Provider Notes (Signed)
50 year old male with multiple medical issues comes to the ER with back pain.  Earlier today he was going down some steps twisted himself wrong and has been having back pain.  No head injury.  Pain is worse with moving and twisting better with rest.  He has Flexeril and hydrocodone at home that he can take for pain.  No issues with urine or bladder.  No fevers or chills no nausea no vomiting.  History of back issues in the past.  Patient has no other complaint requests a work note.       Past Medical History:   Diagnosis Date    CAD (coronary artery disease)     Cancer ([place])     kidney    Chronic pain     DDD (degenerative disc disease), lumbar     Diabetes ([place])     Hypertension     Melanoma ([place])     [place]    OA (osteoarthritis) of knee     Renal lesion        Past Surgical History:   Procedure Laterality Date    HX BACK SURGERY  07/2019    HX GASTRIC BYPASS  2005    HX GASTRIC BYPASS      HX HERNIA REPAIR  2015    HX ORTHOPAEDIC  2013    fractured back    HX OTHER SURGICAL Bilateral 2012, 2014    Rotator cuff    HX OTHER SURGICAL Left 1989    degloved injury    HX SHOULDER ARTHROSCOPY      rotator cuff repair x 2         Family History:   Problem Relation Age of Onset    Diabetes Mother     Cancer Mother         breast    Heart Disease Mother     Cancer Father         lung    Diabetes Father     Stroke Father     Diabetes Sister     Hypertension Sister     Osteoporosis Sister     Cancer Sister         cervical cancer    Heart Failure Sister     Hypertension Sister     Kidney Disease Brother         had transplant    Hypertension Brother     Diabetes Brother     Hypertension Sister     Diabetes Brother     Stroke Brother     Hypertension Brother     Heart Attack Brother     Heart Attack Brother     Hypertension Brother        Social History     Socioeconomic History    Marital status: MARRIED     Spouse name: Not on file    Number of children: Not on file    Years of education: Not on file    Highest education  level: Not on file   Occupational History    Not on file   Tobacco Use    Smoking status: Former    Smokeless tobacco: Never   Substance and Sexual Activity    Alcohol use: Not Currently     Alcohol/week: 0.0 standard drinks     Comment: rare    Drug use: No    Sexual activity: Yes     Partners: Female   Other Topics Concern  Not on file   Social History Narrative    Not on file     Social Determinants of Health     Financial Resource Strain: Not on file   Food Insecurity: Not on file   Transportation Needs: Not on file   Physical Activity: Not on file   Stress: Not on file   Social Connections: Not on file   Intimate Partner Violence: Not on file   Housing Stability: Not on file         ALLERGIES: Heparin analogues    Review of Systems   Constitutional: Negative.    HENT: Negative.     Respiratory: Negative.     Cardiovascular: Negative.    Musculoskeletal:  Positive for back pain.   Neurological: Negative.    All other systems reviewed and are negative.    Vitals:    03/11/21 1738 03/11/21 1746   BP:  138/74   Pulse:  61   Resp: 18    Temp:  97.9 ??F (36.6 ??C)   SpO2:  97%   Weight: 131.5 kg (290 lb)    Height: 6\' 3"  (1.905 m)             Physical Exam  Vitals and nursing note reviewed.   Constitutional:       General: He is not in acute distress.     Appearance: He is well-developed. He is not diaphoretic.   HENT:      Head: Normocephalic and atraumatic.   Eyes:      General: No scleral icterus.        Right eye: No discharge.         Left eye: No discharge.      Conjunctiva/sclera: Conjunctivae normal.      Pupils: Pupils are equal, round, and reactive to light.   Neck:      Vascular: No JVD.   Cardiovascular:      Rate and Rhythm: Normal rate and regular rhythm.   Pulmonary:      Effort: Pulmonary effort is normal. No respiratory distress.      Breath sounds: Normal breath sounds. No stridor. No wheezing, rhonchi or rales.   Chest:      Chest wall: No tenderness.   Abdominal:      General: There is no  distension.      Palpations: Abdomen is soft. There is no mass.      Tenderness: There is no abdominal tenderness. There is no right CVA tenderness, left CVA tenderness, guarding or rebound.      Hernia: No hernia is present.   Musculoskeletal:         General: Tenderness present. Normal range of motion.      Cervical back: Normal range of motion and neck supple.      Comments: Mild paraspinal tenderness   Skin:     General: Skin is warm.   Neurological:      Mental Status: He is alert and oriented to person, place, and time.        MDM  Number of Diagnoses or Management Options  Bilateral low back pain without sciatica, unspecified chronicity  Diagnosis management comments: Patient has medication at home vital signs are normal reassuring physical exam will discharge.    Differential diagnosis back pain MS pain compression fracture           Procedures

## 2021-03-11 NOTE — ED Notes (Signed)
I have reviewed discharge instructions with the patient.  The patient verbalized understanding.

## 2021-03-11 NOTE — ED Notes (Signed)
Pt states that he was walking up steps and missed a step hurting low back today. Pt states that he has chronic back pain and goes to pain clinic for pain meds management

## 2021-07-12 LAB — HEMOGLOBIN A1C
Estimated Avg Glucose, External: 257 mg/dL — ABNORMAL HIGH (ref 91–123)
Hemoglobin A1C, External: 10.6 % — ABNORMAL HIGH (ref 4.8–5.6)

## 2021-07-13 LAB — HEMOGLOBIN A1C
Estimated Avg Glucose, External: 258 mg/dL — ABNORMAL HIGH (ref 91–123)
Hemoglobin A1C, External: 10.6 % — ABNORMAL HIGH (ref 4.8–5.6)
Hemoglobin A1C: 10.6 %

## 2021-08-27 ENCOUNTER — Emergency Department: Admit: 2021-08-27 | Payer: MEDICAID | Primary: Physician Assistant

## 2021-08-27 ENCOUNTER — Inpatient Hospital Stay
Admit: 2021-08-27 | Discharge: 2021-08-27 | Disposition: A | Discharge status: Home / Self Care | Payer: MEDICAID | Attending: Emergency Medicine

## 2021-08-27 DIAGNOSIS — M1611 Unilateral primary osteoarthritis, right hip: Secondary | ICD-10-CM

## 2021-08-27 DIAGNOSIS — M25551 Pain in right hip: Secondary | ICD-10-CM

## 2021-08-27 LAB — CBC WITH AUTO DIFFERENTIAL
Absolute Immature Granulocyte: 0 10*3/uL (ref 0.00–0.04)
Basophils %: 0 % (ref 0–2)
Basophils Absolute: 0 10*3/uL (ref 0.0–0.1)
Eosinophils %: 2 % (ref 0–5)
Eosinophils Absolute: 0.1 10*3/uL (ref 0.0–0.4)
Hematocrit: 39.7 % (ref 36.0–48.0)
Hemoglobin: 12.9 g/dL — ABNORMAL LOW (ref 13.0–16.0)
Immature Granulocytes: 0 % (ref 0–0.5)
Lymphocytes %: 21 % (ref 21–52)
Lymphocytes Absolute: 1.3 10*3/uL (ref 0.9–3.6)
MCH: 28.4 PG (ref 24.0–34.0)
MCHC: 32.5 g/dL (ref 31.0–37.0)
MCV: 87.4 FL (ref 78.0–100.0)
MPV: 10.9 FL (ref 9.2–11.8)
Monocytes %: 7 % (ref 3–10)
Monocytes Absolute: 0.4 10*3/uL (ref 0.05–1.2)
Neutrophils %: 70 % (ref 40–73)
Neutrophils Absolute: 4.1 10*3/uL (ref 1.8–8.0)
Nucleated RBCs: 0 PER 100 WBC
Platelets: 188 10*3/uL (ref 135–420)
RBC: 4.54 M/uL (ref 4.35–5.65)
RDW: 13.9 % (ref 11.6–14.5)
WBC: 5.9 10*3/uL (ref 4.6–13.2)
nRBC: 0 10*3/uL (ref 0.00–0.01)

## 2021-08-27 LAB — BASIC METABOLIC PANEL
Anion Gap: 7 mmol/L (ref 3.0–18.0)
BUN: 16 mg/dL (ref 7–18)
Bun/Cre Ratio: 24 — ABNORMAL HIGH (ref 12–20)
CO2: 27 mmol/L (ref 21–32)
Calcium: 8.9 mg/dL (ref 8.5–10.1)
Chloride: 106 mmol/L (ref 100–111)
Creatinine: 0.68 mg/dL (ref 0.60–1.30)
Est, Glom Filt Rate: >60 mL/min/{1.73_m2} (ref >60)
Glucose: 110 mg/dL — ABNORMAL HIGH (ref 74–99)
Potassium: 3.8 mmol/L (ref 3.5–5.5)
Sodium: 140 mmol/L (ref 136–145)

## 2021-08-27 MED ORDER — ONDANSETRON HCL 4 MG/2ML IJ SOLN
4 MG/2ML | INTRAMUSCULAR | Status: AC
Start: 2021-08-27 — End: 2021-08-27
  Administered 2021-08-27: 15:00:00 4 mg via INTRAVENOUS

## 2021-08-27 MED ORDER — KETOROLAC TROMETHAMINE 30 MG/ML IJ SOLN
30 MG/ML | INTRAMUSCULAR | Status: AC
Start: 2021-08-27 — End: 2021-08-27
  Administered 2021-08-27: 17:00:00 30 mg via INTRAVENOUS

## 2021-08-27 MED ORDER — MORPHINE SULFATE 4 MG/ML IJ SOLN
4 MG/ML | INTRAMUSCULAR | Status: AC
Start: 2021-08-27 — End: 2021-08-27
  Administered 2021-08-27: 15:00:00 4 mg via INTRAVENOUS

## 2021-08-27 MED ORDER — KETOROLAC TROMETHAMINE 10 MG PO TABS
10 MG | ORAL_TABLET | Freq: Three times a day (TID) | ORAL | 0 refills | Status: DC | PRN
Start: 2021-08-27 — End: 2022-08-11

## 2021-08-27 MED FILL — KETOROLAC TROMETHAMINE 30 MG/ML IJ SOLN: 30 MG/ML | INTRAMUSCULAR | Qty: 1

## 2021-08-27 MED FILL — MORPHINE SULFATE 4 MG/ML IJ SOLN: 4 mg/mL | INTRAMUSCULAR | Qty: 1

## 2021-08-27 MED FILL — ONDANSETRON HCL 4 MG/2ML IJ SOLN: 4 MG/2ML | INTRAMUSCULAR | Qty: 2

## 2021-08-27 NOTE — ED Triage Notes (Signed)
Pt states he fell about an hour ago.  States he slipped on a decking and twisted his hip, then fell on it  Pain is in right hip

## 2021-08-27 NOTE — ED Provider Notes (Signed)
[facility] EMERGENCY DEPT  EMERGENCY DEPARTMENT ENCOUNTER       Pt Name: Jordan Weiss  MRN: [id number]  [place] December 29, 1970  Date of evaluation: 08/27/2021  Provider: Smitty Knudsen, MD   PCP: Wyn Quaker, PA  Note Started: 12:05 PM 08/27/21     CHIEF COMPLAINT       Chief Complaint   Patient presents with    Hip Pain        HISTORY OF PRESENT ILLNESS: 1 or more elements      History From: Patient  History limited by: Nothing     Jordan Weiss is a 51 y.o. male who presents to the ED complaining of a fall just prior to ED arrival, he states he was fixing his car when he fell.  During the fall he said he twisted himself and his buttocks landed inside the car.  He complains of severe pain right hip, was unable to support weight with the right leg.  He has no back pain.     Nursing Notes were all reviewed and agreed with or any disagreements were addressed in the HPI.     REVIEW OF SYSTEMS      Review of Systems     Positives and Pertinent negatives as per HPI.    PAST HISTORY     Past Medical History:  Past Medical History:   Diagnosis Date    CAD (coronary artery disease)     Cancer ([place])     kidney    Chronic pain     DDD (degenerative disc disease), lumbar     Diabetes ([place])     Hypertension     Melanoma ([place])     [place]    OA (osteoarthritis) of knee     Renal lesion        Past Surgical History:  Past Surgical History:   Procedure Laterality Date    BACK SURGERY  07/2019    GASTRIC BYPASS SURGERY      GASTRIC BYPASS SURGERY  2005    HERNIA REPAIR  2015    ORTHOPEDIC SURGERY  2013    fractured back    OTHER SURGICAL HISTORY Left 1989    degloved injury    OTHER SURGICAL HISTORY Bilateral 2012, 2014    Rotator cuff    SHOULDER ARTHROSCOPY      rotator cuff repair x 2       Family History:  Family History   Problem Relation Age of Onset    Diabetes Mother     Cancer Mother         breast    Hypertension Brother     Heart Attack Brother     Heart Attack Brother     Hypertension Brother     Stroke Brother      Diabetes Brother     Hypertension Sister     Diabetes Brother     Hypertension Brother     Kidney Disease Brother         had transplant    Hypertension Sister     Heart Failure Sister     Cancer Sister         cervical cancer    Osteoporosis Sister     Hypertension Sister     Diabetes Sister     Stroke Father     Diabetes Father     Heart Disease Mother     Cancer Father         lung  Social History:  Social History     Tobacco Use    Smoking status: Former    Smokeless tobacco: Never   Substance Use Topics    Alcohol use: Not Currently     Alcohol/week: 0.0 standard drinks    Drug use: No       Allergies:  Allergies   Allergen Reactions    Heparin Angioedema and Shortness Of Breath       CURRENT MEDICATIONS      Discharge Medication List as of 08/27/2021  1:09 PM        CONTINUE these medications which have NOT CHANGED    Details   clopidogrel (PLAVIX) 75 MG tablet Take by mouth dailyHistorical Med      rivaroxaban (XARELTO) 20 MG TABS tablet Take by mouthHistorical Med      acetaminophen (TYLENOL) 325 MG tablet Take by mouth every 4 hours as neededHistorical Med      atenolol (TENORMIN) 25 MG tablet Take 25 mg by mouth dailyHistorical Med      atorvastatin (LIPITOR) 20 MG tablet Take 20 mg by mouthHistorical Med      cyclobenzaprine (FLEXERIL) 10 MG tablet Take 10 mg by mouth every 8 hours as neededHistorical Med      dilTIAZem (TIAZAC) 120 MG extended release capsule TAKE 1 CAPSULE BY MOUTH ONCE DAILYHistorical Med      docusate (COLACE, DULCOLAX) 100 MG CAPS Take 100 mg by mouth dailyHistorical Med      enalapril (VASOTEC) 20 MG tablet Take 20 mg by mouth dailyHistorical Med      hydroCHLOROthiazide (HYDRODIURIL) 25 MG tablet Take 25 mg by mouth dailyHistorical Med      Insulin NPH Isophane & Regular (HUMULIN 70/30 KWIKPEN) (70-30) 100 UNIT per ML injection pen INJECT 20 UNITS SUBCUTANEOUSLY IN THE MORNING WITH BREAKFAST AND 10 UNITS IN THE EVENING WITH DINNERHistorical Med      nitroGLYCERIN (NITROSTAT)  0.4 MG SL tablet Place 0.4 mg under the tongueHistorical Med      omeprazole (PRILOSEC) 20 MG delayed release capsule TAKE 1 CAPSULE BY MOUTH ONCE DAILYHistorical Med      traZODone (DESYREL) 150 MG tablet Take 150 mg by mouth 2 times dailyHistorical Med             SCREENINGS               No data recorded         PHYSICAL EXAM      Vitals:    08/27/21 1007 08/27/21 1255   BP: 135/82 (!) 140/82   Pulse: 68 52   Resp: 18 16   Temp: 97.9 ??F (36.6 ??C)    TempSrc: Oral    SpO2: 100% 96%   Weight: 290 lb (131.5 kg)    Height: '6\' 3"'$  (1.905 m)      Physical Exam    Nursing notes and vital signs reviewed    Constitutional: Morbidly obese male who appears uncomfortable but in no acute distress.  Head: Normocephalic, Atraumatic  Eyes: EOMI  Neck: Supple  Cardiovascular: Regular rate and rhythm, no murmurs, rubs, or gallops  Chest: Normal work of breathing and chest excursion bilaterally  Lungs: Clear to ausculation bilaterally  Abdomen: Soft, non tender, non distended, normoactive bowel sounds  Back: No evidence of trauma or deformity  Extremities: Tenderness to palpation of right hip.  No crepitus.  Patient able to raise right leg against gravity, raise his left leg better.  Neurovascularly right leg intact.  Axial loading  to right leg also intact.  Skin: Warm and dry, normal cap refill  Neuro: Alert and appropriate, CN intact, normal speech, strength and sensation full and symmetric bilaterally,   Psychiatric: Normal mood and affect     DIAGNOSTIC RESULTS   LABS:     Labs Reviewed   BASIC METABOLIC PANEL - Abnormal; Notable for the following components:       Result Value    Glucose 110 (*)     Bun/Cre Ratio 24 (*)     All other components within normal limits   CBC WITH AUTO DIFFERENTIAL - Abnormal; Notable for the following components:    Hemoglobin 12.9 (*)     All other components within normal limits        RADIOLOGY:  Non-plain film images such as CT, Ultrasound and MRI are read by the radiologist. Plain radiographic  images are visualized and preliminarily interpreted by the ED Provider with the below findings:       Interpretation per the Radiologist below, if available at the time of this note:     CT HIP RIGHT WO CONTRAST   Final Result      No fracture. Moderate right hip osteoarthritis.      XR HIP 2-3 VW W PELVIS RIGHT   Final Result   No acute abnormality.           PROCEDURES   Unless otherwise noted below, none  Procedures     CRITICAL CARE TIME       EMERGENCY DEPARTMENT COURSE and DIFFERENTIAL DIAGNOSIS/MDM   Vitals:    Vitals:    08/27/21 1007 08/27/21 1255   BP: 135/82 (!) 140/82   Pulse: 68 52   Resp: 18 16   Temp: 97.9 ??F (36.6 ??C)    TempSrc: Oral    SpO2: 100% 96%   Weight: 290 lb (131.5 kg)    Height: '6\' 3"'$  (1.905 m)         Patient was given the following medications:  Medications   ondansetron (ZOFRAN) injection 4 mg (4 mg IntraVENous Given 08/27/21 1053)   morphine injection 4 mg (4 mg IntraVENous Given 08/27/21 1055)   ketorolac (TORADOL) injection 30 mg (30 mg IntraVENous Given 08/27/21 1257)       CONSULTS: (Who and What was discussed)  None    Chronic Conditions: As above    Social Determinants affecting Dx or Tx:  None    Records Reviewed (source and summary): Old medical records.  Nursing notes.      CC/HPI Summary, DDx, ED Course, and Reassessment:   Patient presented with right hip pain after a fall and now unable to support weight with right leg.  On exam tender right hip.  Also tender with right leg axial loading.  Clinically symmetry of the fracture however x-rays revealed just some arthritis, CT scan also no fracture.  Patient still unable to support weight that well after pain medications.  He however was able to stand and refused crutches in ED.  He stated he has a walker and crutches at home.  Patient discharge to follow-up with PCP but also given Ortho referral.       Disposition Considerations (Tests not done, Shared Decision Making, Pt Expectation of Test or Tx.):      FINAL IMPRESSION     1.  Acute right hip pain    2. Osteoarthritis of right hip, unspecified osteoarthritis type         DISPOSITION/PLAN   DISPOSITION  Decision To Discharge 08/27/2021 12:53:42 PM      Discharged     PATIENT REFERRED TO:  Lonia Mad, PA  [address]  [place] [address]  [phone]    In 3 days      [facility] EMERGENCY DEPT  [place]  [phone]    If symptoms worsen     DISCHARGE MEDICATIONS:  Discharge Medication List as of 08/27/2021  1:09 PM             Details   ketorolac (TORADOL) 10 MG tablet Take 1 tablet by mouth every 8 hours as needed for Pain, Disp-15 tablet, R-0Normal                Details   clopidogrel (PLAVIX) 75 MG tablet Take by mouth dailyHistorical Med      rivaroxaban (XARELTO) 20 MG TABS tablet Take by mouthHistorical Med      acetaminophen (TYLENOL) 325 MG tablet Take by mouth every 4 hours as neededHistorical Med      atenolol (TENORMIN) 25 MG tablet Take 25 mg by mouth dailyHistorical Med      atorvastatin (LIPITOR) 20 MG tablet Take 20 mg by mouthHistorical Med      cyclobenzaprine (FLEXERIL) 10 MG tablet Take 10 mg by mouth every 8 hours as neededHistorical Med      dilTIAZem (TIAZAC) 120 MG extended release capsule TAKE 1 CAPSULE BY MOUTH ONCE DAILYHistorical Med      docusate (COLACE, DULCOLAX) 100 MG CAPS Take 100 mg by mouth dailyHistorical Med      enalapril (VASOTEC) 20 MG tablet Take 20 mg by mouth dailyHistorical Med      hydroCHLOROthiazide (HYDRODIURIL) 25 MG tablet Take 25 mg by mouth dailyHistorical Med      Insulin NPH Isophane & Regular (HUMULIN 70/30 KWIKPEN) (70-30) 100 UNIT per ML injection pen INJECT 20 UNITS SUBCUTANEOUSLY IN THE MORNING WITH BREAKFAST AND 10 UNITS IN THE EVENING WITH DINNERHistorical Med      nitroGLYCERIN (NITROSTAT) 0.4 MG SL tablet Place 0.4 mg under the tongueHistorical Med      omeprazole (PRILOSEC) 20 MG delayed release capsule TAKE 1 CAPSULE BY MOUTH ONCE DAILYHistorical Med      traZODone (DESYREL) 150 MG  tablet Take 150 mg by mouth 2 times dailyHistorical Med             DISCONTINUED MEDICATIONS:  Discharge Medication List as of 08/27/2021  1:09 PM        STOP taking these medications       HYDROcodone-acetaminophen (NORCO) 7.5-325 MG per tablet Comments:   Reason for Stopping:               I am the Primary Clinician of Record.   Gearldene Fiorenza, MD (electronically signed)    (Please note that parts of this dictation were completed with voice recognition software. Quite often unanticipated grammatical, syntax, homophones, and other interpretive errors are inadvertently transcribed by the computer software. Please disregards these errors. Please excuse any errors that have escaped final proofreading.)        Tashay Bozich S Jalacia Mattila, MD  09/03/21 1205

## 2022-02-19 ENCOUNTER — Emergency Department: Admit: 2022-02-19 | Payer: MEDICAID | Primary: Physician Assistant

## 2022-02-19 ENCOUNTER — Inpatient Hospital Stay
Admit: 2022-02-19 | Discharge: 2022-02-19 | Disposition: A | Discharge status: Home / Self Care | Payer: MEDICAID | Attending: Emergency Medicine

## 2022-02-19 DIAGNOSIS — R079 Chest pain, unspecified: Secondary | ICD-10-CM

## 2022-02-19 LAB — COMPREHENSIVE METABOLIC PANEL
ALT: 35 U/L (ref 16–61)
AST: 41 U/L — ABNORMAL HIGH (ref 10–38)
Albumin/Globulin Ratio: 0.8 (ref 0.8–1.7)
Albumin: 3.6 g/dL (ref 3.4–5.0)
Alk Phosphatase: 133 U/L — ABNORMAL HIGH (ref 45–117)
Anion Gap: 8 mmol/L (ref 3.0–18.0)
BUN: 18 mg/dL (ref 7–18)
Bun/Cre Ratio: 20 (ref 12–20)
CO2: 26 mmol/L (ref 21–32)
Calcium: 8.9 mg/dL (ref 8.5–10.1)
Chloride: 103 mmol/L (ref 100–111)
Creatinine: 0.91 mg/dL (ref 0.60–1.30)
Est, Glom Filt Rate: >60 mL/min/{1.73_m2} (ref >60)
Globulin: 4.4 g/dL — ABNORMAL HIGH (ref 2.0–4.0)
Glucose: 185 mg/dL — ABNORMAL HIGH (ref 74–99)
Potassium: 3.8 mmol/L (ref 3.5–5.5)
Sodium: 137 mmol/L (ref 136–145)
Total Bilirubin: 0.9 mg/dL (ref 0.2–1.0)
Total Protein: 8 g/dL (ref 6.4–8.2)

## 2022-02-19 LAB — CBC WITH AUTO DIFFERENTIAL
Absolute Immature Granulocyte: 0 10*3/uL (ref 0.00–0.04)
Basophils %: 0 % (ref 0–2)
Basophils Absolute: 0 10*3/uL (ref 0.0–0.1)
Eosinophils %: 2 % (ref 0–5)
Eosinophils Absolute: 0.2 10*3/uL (ref 0.0–0.4)
Hematocrit: 40.1 % (ref 36.0–48.0)
Hemoglobin: 13.2 g/dL (ref 13.0–16.0)
Immature Granulocytes: 0 % (ref 0–0.5)
Lymphocytes %: 22 % (ref 21–52)
Lymphocytes Absolute: 1.9 10*3/uL (ref 0.9–3.6)
MCH: 27.3 PG (ref 24.0–34.0)
MCHC: 32.9 g/dL (ref 31.0–37.0)
MCV: 83 FL (ref 78.0–100.0)
MPV: 11 FL (ref 9.2–11.8)
Monocytes %: 5 % (ref 3–10)
Monocytes Absolute: 0.4 10*3/uL (ref 0.05–1.2)
Neutrophils %: 71 % (ref 40–73)
Neutrophils Absolute: 6.2 10*3/uL (ref 1.8–8.0)
Nucleated RBCs: 0 PER 100 WBC
Platelets: 227 10*3/uL (ref 135–420)
RBC: 4.83 M/uL (ref 4.35–5.65)
RDW: 15.3 % — ABNORMAL HIGH (ref 11.6–14.5)
WBC: 8.7 10*3/uL (ref 4.6–13.2)
nRBC: 0 10*3/uL (ref 0.00–0.01)

## 2022-02-19 LAB — TROPONIN
Troponin, High Sensitivity: 16 ng/L (ref 0–78)
Troponin, High Sensitivity: 15 ng/L (ref 0–78)

## 2022-02-19 LAB — LACTIC ACID: Lactic Acid, Plasma: 2.1 mmol/L (ref 0.4–2.0)

## 2022-02-19 LAB — MAGNESIUM: Magnesium: 1.9 mg/dL (ref 1.6–2.6)

## 2022-02-19 MED ORDER — RANOLAZINE ER 500 MG PO TB12
500 MG | Freq: Two times a day (BID) | ORAL | Status: DC
Start: 2022-02-19 — End: 2022-02-19
  Administered 2022-02-19: 13:00:00 500 mg via ORAL

## 2022-02-19 MED ORDER — NITROGLYCERIN 0.4 MG SL SUBL
0.4 MG | SUBLINGUAL | Status: DC | PRN
Start: 2022-02-19 — End: 2022-02-19
  Administered 2022-02-19: 12:00:00 0.4 mg via SUBLINGUAL

## 2022-02-19 MED FILL — RANOLAZINE ER 500 MG PO TB12: 500 MG | ORAL | Qty: 1

## 2022-02-19 NOTE — ED Provider Notes (Addendum)
[facility] EMERGENCY DEPT  EMERGENCY DEPARTMENT ENCOUNTER      Pt Name: Jordan Weiss  MRN: [id number]  [place] 1971/02/15  Date of evaluation: 02/19/2022  Provider: Ciro Backer, MD    CHIEF COMPLAINT       Chief Complaint   Patient presents with    Chest Pain         HISTORY OF PRESENT ILLNESS   (Location/Symptom, Timing/Onset, Context/Setting, Quality, Duration, Modifying Factors, Severity)  Note limiting factors.   Yossef Gilkison is a 51 y.o. male who presents to the emergency department      51 year old male with history of lumbar spondylosis, type 2 diabetes on insulin pump, severe obesity, WPW, atrial fibrillation, hypertension, chronic pain, left kidney cancer, melanoma, coronary artery disease status post MI with stent on 07/11/2021 at [place] that presents with chest pain for the last few days.  Patient states that he developed substernal pressure that last for few minutes with some radiation to the left elbow and left bicep area that occurs several times a day.  Patient describes the pain as a pressure, gradual, sometimes occurs with activity and sometimes at rest.  He states it is similar to the heart attack he had July 11, 2021.  He states that at that time he went to [place] with his family due to poor family white cough and they were all diagnosed as having COVID and at the same time he was noted to have an abnormal EKG with an acute MI.  Patient is on Plavix and Xarelto.  He states the pain is a 7 out of 10 when it occurs.  He states that he is not sure if he is having cardiac pain because he is so anxious about his heart and is under somewhat stress that he gets worried about every pain that he gets in his chest.    The history is provided by the patient.     Nursing Notes were reviewed.    REVIEW OF SYSTEMS    (2-9 systems for level 4, 10 or more for level 5)     Review of Systems   Constitutional:  Negative for chills and fever.   Respiratory:  Negative for cough and shortness of breath.     Cardiovascular:  Positive for chest pain. Negative for palpitations.   Gastrointestinal:  Negative for abdominal pain, diarrhea, nausea and vomiting.   Genitourinary:  Negative for dysuria and flank pain.   Neurological:  Negative for headaches.   Psychiatric/Behavioral:  Negative for confusion.      Except as noted above the remainder of the review of systems was reviewed and negative.       PAST MEDICAL HISTORY     Past Medical History:   Diagnosis Date    CAD (coronary artery disease)     Cancer ([place])     kidney    Chronic pain     DDD (degenerative disc disease), lumbar     Diabetes ([place])     Hypertension     Melanoma ([place])     [place]    OA (osteoarthritis) of knee     Renal lesion          SURGICAL HISTORY       Past Surgical History:   Procedure Laterality Date    BACK SURGERY  07/2019    GASTRIC BYPASS SURGERY      GASTRIC BYPASS SURGERY  2005    HERNIA REPAIR  2015    ORTHOPEDIC SURGERY  2013  fractured back    OTHER SURGICAL HISTORY Left 1989    degloved injury    OTHER SURGICAL HISTORY Bilateral 2012, 2014    Rotator cuff    SHOULDER ARTHROSCOPY      rotator cuff repair x 2         CURRENT MEDICATIONS       Previous Medications    ACETAMINOPHEN (TYLENOL) 325 MG TABLET    Take by mouth every 4 hours as needed    ATENOLOL (TENORMIN) 25 MG TABLET    Take 25 mg by mouth daily    ATORVASTATIN (LIPITOR) 20 MG TABLET    Take 20 mg by mouth    CLOPIDOGREL (PLAVIX) 75 MG TABLET    Take by mouth daily    CYCLOBENZAPRINE (FLEXERIL) 10 MG TABLET    Take 10 mg by mouth every 8 hours as needed    DILTIAZEM (TIAZAC) 120 MG EXTENDED RELEASE CAPSULE    TAKE 1 CAPSULE BY MOUTH ONCE DAILY    DOCUSATE (COLACE, DULCOLAX) 100 MG CAPS    Take 100 mg by mouth daily    ENALAPRIL (VASOTEC) 20 MG TABLET    Take 20 mg by mouth daily    HYDROCHLOROTHIAZIDE (HYDRODIURIL) 25 MG TABLET    Take 25 mg by mouth daily    INSULIN NPH ISOPHANE & REGULAR (HUMULIN 70/30 KWIKPEN) (70-30) 100 UNIT PER ML INJECTION PEN    INJECT 20 UNITS  SUBCUTANEOUSLY IN THE MORNING WITH BREAKFAST AND 10 UNITS IN THE EVENING WITH DINNER    KETOROLAC (TORADOL) 10 MG TABLET    Take 1 tablet by mouth every 8 hours as needed for Pain    NITROGLYCERIN (NITROSTAT) 0.4 MG SL TABLET    Place 0.4 mg under the tongue    OMEPRAZOLE (PRILOSEC) 20 MG DELAYED RELEASE CAPSULE    TAKE 1 CAPSULE BY MOUTH ONCE DAILY    RIVAROXABAN (XARELTO) 20 MG TABS TABLET    Take by mouth    TRAZODONE (DESYREL) 150 MG TABLET    Take 150 mg by mouth 2 times daily       ALLERGIES     Heparin    FAMILY HISTORY       Family History   Problem Relation Age of Onset    Diabetes Mother     Cancer Mother         breast    Hypertension Brother     Heart Attack Brother     Heart Attack Brother     Hypertension Brother     Stroke Brother     Diabetes Brother     Hypertension Sister     Diabetes Brother     Hypertension Brother     Kidney Disease Brother         had transplant    Hypertension Sister     Heart Failure Sister     Cancer Sister         cervical cancer    Osteoporosis Sister     Hypertension Sister     Diabetes Sister     Stroke Father     Diabetes Father     Heart Disease Mother     Cancer Father         lung          SOCIAL HISTORY       Social History     Socioeconomic History    Marital status: Married     Spouse name: None  Number of children: None    Years of education: None    Highest education level: None   Tobacco Use    Smoking status: Former    Smokeless tobacco: Never   Substance and Sexual Activity    Alcohol use: Not Currently     Alcohol/week: 0.0 standard drinks    Drug use: No       SCREENINGS                               CIWA Assessment  BP: (!) 106/44  Pulse: 60                 PHYSICAL EXAM    (up to 7 for level 4, 8 or more for level 5)     ED Triage Vitals [02/19/22 0737]   BP Temp Temp Source Pulse Respirations SpO2 Height Weight - Scale   123/76 97.9 F (36.6 C) Oral 61 18 98 % 6' 3" (1.905 m) (!) 315 lb (142.9 kg)       Physical Exam  Vitals and nursing note reviewed.    Constitutional:       General: He is not in acute distress.     Appearance: Normal appearance. He is obese. He is not ill-appearing or toxic-appearing.   HENT:      Mouth/Throat:      Pharynx: Oropharynx is clear.   Eyes:      Extraocular Movements: Extraocular movements intact.      Conjunctiva/sclera: Conjunctivae normal.   Cardiovascular:      Rate and Rhythm: Regular rhythm.      Heart sounds: Murmur heard.   Pulmonary:      Effort: Pulmonary effort is normal. No respiratory distress.      Breath sounds: Normal breath sounds. No wheezing or rales.   Abdominal:      General: There is no distension.      Palpations: Abdomen is soft.      Tenderness: There is no abdominal tenderness.   Musculoskeletal:         General: Normal range of motion.      Cervical back: Neck supple.   Skin:     General: Skin is warm.   Neurological:      Mental Status: He is alert and oriented to person, place, and time.   Psychiatric:         Behavior: Behavior normal.       DIAGNOSTIC RESULTS     EKG: All EKG's are interpreted by the Emergency Department Physician who either signs or Co-signs this chart in the absence of a cardiologist.      EKG read by Dr. Lollie Marrow 07: 4 3  Atrial fibrillation 64/min.  Normal QRS, normal axis, normal QTc.  No acute ST segment changes.    RADIOLOGY:   Non-plain film images such as CT, Ultrasound and MRI are read by the radiologist. Plain radiographic images are visualized and preliminarily interpreted by the emergency physician with the below findings:    Interpretation per the Radiologist below, if available at the time of this note:    XR CHEST PORTABLE   Final Result   No acute cardiopulmonary disease.                    ED BEDSIDE ULTRASOUND:   Performed by ED Physician - none    LABS:  Labs Reviewed   CBC WITH AUTO DIFFERENTIAL -  Abnormal; Notable for the following components:       Result Value    RDW 15.3 (*)     All other components within normal limits   COMPREHENSIVE METABOLIC PANEL -  Abnormal; Notable for the following components:    Glucose 185 (*)     AST 41 (*)     Alk Phosphatase 133 (*)     Globulin 4.4 (*)     All other components within normal limits   LACTIC ACID - Abnormal; Notable for the following components:    Lactic Acid, Plasma 2.1 (*)     All other components within normal limits   MAGNESIUM   TROPONIN   TROPONIN       All other labs were within normal range or not returned as of this dictation.    EMERGENCY DEPARTMENT COURSE and DIFFERENTIAL DIAGNOSIS/MDM:   Vitals:    Vitals:    02/19/22 0737 02/19/22 0813 02/19/22 0821 02/19/22 0828   BP: 123/76 115/80 (!) 88/54 (!) 106/44   Pulse: 61 58 81 60   Resp: _0 Temp: 97.9 F (36.6 C)      TempSrc: Oral      SpO2: 98%   96%   Weight: (!) 315 lb (142.9 kg)      Height: 6' 3" (1.905 m)            Medical Decision Making  Amount and/or Complexity of Data Reviewed  Labs: ordered.  Radiology: ordered.  ECG/medicine tests: ordered.    Risk  Prescription drug management.    51 year old anxious male with history of MI July 13, 2021 with 1 stent that presents with chest pain.  Has atrial fibrillation.Differential Diagnosis to include but not limited to: Acute coronary syndrome, esophagitis, hiatal hernia, esophageal perforation, PE, pericarditis, valvular heart disease, pneumonia, pneumothorax, hemothorax, foreign body, rib fracture, pleurisy, chest contusion, aortic dissection, referred pain from abdomen.      I utilized an evidence-based risk rating tool (CMT) along with my training and experience to weigh the risk of discharge against the risks of further testing, imaging, or hospitalization. At this time I estimate the risks of additional testing, imaging, or hospitalization to be equal to or greater than the risk of discharge. I discussed my risk assessment with the patient and the patient consents to the risk of discharge as well as the risk of uncertainty in estimating outcomes.      The patient's HEART Score is <4. In rare  cases, I give patients with HEART Score of 4 the option of discharge, but only when they meet criteria for "Low 4," meaning that HST was used, and the 4 is not from a highly suspicious story, highly suspicious EKG, or positive cardiac enzymes.  In these selected cases, the risk of a "Low 4" is still most likely lower than the risk of admission and further testing/imaging. [id number]          REASSESSMENT          CRITICAL CARE TIME       CONSULTS:  IP CONSULT TO CARDIOLOGY  11:00 AM  Case discussed with Nira Conn; will see pt in the ER  11:00 AM  Seen by Nira Conn in ED. States that pt had an RCA stent that appeared to be from a clot rather than blockage. Pt  has upcoming appointment.     PROCEDURES:  Unless otherwise noted below, none     Procedures    FINAL IMPRESSION  1. Chest pain, unspecified type          DISPOSITION/PLAN   DISPOSITION Decision To Discharge 02/19/2022 10:59:52 AM      PATIENT REFERRED TO:  Cranston Neighbor, APRN - NP  [address]  [address]  [place] [address]  [phone]    Schedule an appointment as soon as possible for a visit in 2 days        DISCHARGE MEDICATIONS:  New Prescriptions    No medications on file     Controlled Substances Monitoring:     No flowsheet data found.    (Please note that portions of this note were completed with a voice recognition program.  Efforts were made to edit the dictations but occasionally words are mis-transcribed.)    Ciro Backer, MD (electronically signed)  Attending Emergency Physician           Ciro Backer, MD  02/19/22 [place], MD  02/19/22 1109

## 2022-02-19 NOTE — Consults (Cosign Needed)
CARDIOLOGY CONSULTATION    REASON FOR CONSULT: Chest pain    REQUESTING PROVIDER: Dennison Mascot, MD    CHIEF COMPLAINT:  Chest pain     HISTORY OF PRESENT ILLNESS:  Jordan Weiss is a 51 y.o. year-old male with past medical history significant for CAD, NSTEMI (07/12/21)-acute thrombic 99% STO mid RCA; s/p PCI w/DES, persistent atrial fibrillation, OSA, DM type II, obesity, schizophrenia, depression and anxiety who was evaluated today in the ER due to chest pain. Patient reports having intermittent mid-sternal chest pain for the past four days. He admits he has been suffering from anxiety since his cardiac event in January. He was given 1 SL nitro upon arrival to the ER. He currently denies CP, SOB, palpitations, diaphoresis, nausea or vomiting. He report under stress with his chronic back pain and is worried about his cognitive function. He is pending a neurological work-up with neurologist. He reports taking all medication as prescribed with no missed doses. EKG shows AF, rate controlled with no ischemic changes, troponin negative, BMP and CBC unremarkable, and CXR with no acute process.    Records from hospital admission course thus far reviewed.      Telemetry reviewed.  Atrial fibrillation, rate 60's.    INPATIENT MEDICATIONS:  Home medications reviewed.    Current Facility-Administered Medications:     nitroGLYCERIN (NITROSTAT) SL tablet 0.4 mg, 0.4 mg, SubLINGual, Q5 Min PRN, Amalia Greenhouse, MD, 0.4 mg at 02/19/22 0813    ranolazine (RANEXA) extended release tablet 500 mg, 500 mg, Oral, BID, Larena Sox, APRN - CNP, 500 mg at 02/19/22 7062    Current Outpatient Medications:     clopidogrel (PLAVIX) 75 MG tablet, Take by mouth daily, Disp: , Rfl:     rivaroxaban (XARELTO) 20 MG TABS tablet, Take by mouth, Disp: , Rfl:     ketorolac (TORADOL) 10 MG tablet, Take 1 tablet by mouth every 8 hours as needed for Pain (Patient not taking: Reported on 02/19/2022), Disp: 15 tablet, Rfl: 0    acetaminophen  (TYLENOL) 325 MG tablet, Take by mouth every 4 hours as needed, Disp: , Rfl:     atenolol (TENORMIN) 25 MG tablet, Take 25 mg by mouth daily, Disp: , Rfl:     atorvastatin (LIPITOR) 20 MG tablet, Take 20 mg by mouth, Disp: , Rfl:     cyclobenzaprine (FLEXERIL) 10 MG tablet, Take 10 mg by mouth every 8 hours as needed (Patient not taking: Reported on 02/19/2022), Disp: , Rfl:     dilTIAZem (TIAZAC) 120 MG extended release capsule, TAKE 1 CAPSULE BY MOUTH ONCE DAILY (Patient not taking: Reported on 08/27/2021), Disp: , Rfl:     docusate (COLACE, DULCOLAX) 100 MG CAPS, Take 100 mg by mouth daily, Disp: , Rfl:     enalapril (VASOTEC) 20 MG tablet, Take 20 mg by mouth daily, Disp: , Rfl:     hydroCHLOROthiazide (HYDRODIURIL) 25 MG tablet, Take 25 mg by mouth daily, Disp: , Rfl:     Insulin NPH Isophane & Regular (HUMULIN 70/30 KWIKPEN) (70-30) 100 UNIT per ML injection pen, INJECT 20 UNITS SUBCUTANEOUSLY IN THE MORNING WITH BREAKFAST AND 10 UNITS IN THE EVENING WITH DINNER, Disp: , Rfl:     nitroGLYCERIN (NITROSTAT) 0.4 MG SL tablet, Place 0.4 mg under the tongue, Disp: , Rfl:     omeprazole (PRILOSEC) 20 MG delayed release capsule, TAKE 1 CAPSULE BY MOUTH ONCE DAILY, Disp: , Rfl:     traZODone (DESYREL) 150 MG tablet, Take 150 mg by  mouth 2 times daily, Disp: , Rfl:      ALLERGIES:  Allergies reviewed with the patient,  Allergies   Allergen Reactions    Heparin Angioedema and Shortness Of Breath    .      FAMILY HISTORY:  Family history reviewed.     SOCIAL HISTORY:  Notable for former tobacco use, no heavy alcohol or illicit drug use.      REVIEW OF SYSTEMS:  Complete review of systems performed, pertinents noted above, all other systems are negative.    PHYSICAL EXAMINATION:    General:  Alert and oriented. NAD. Nontoxic.   Cardiovascular:  Rate regular, rhythm irregular, No murmur appreciated.  Respiratory:  Lungs are clear. Non labored respirations.   Abdomen:  Obese, soft, and nontender  Extremities:  No lower  extremity edema  Skin:  Dry, warm  Psych:  Normal affect      Vitals:    02/19/22 0828   BP: (!) 106/44   Pulse: 60   Resp: 17   Temp:    SpO2: 96%       Recent labs results and imaging reviewed.     Discussed case with Dr. Nelson Chimes and our impression and recommendations are as follows:    Chest pain: Resolved.   -LCH (07/12/21) acute thrombotic 99% STO mid RCA; s/p PCI w/ 1 DES  -Echo (07/12/21) Preserved EF 60%, basal hypoK with LVH, mild MVR/AVR  -Troponin negative x2  -CXR with no acute findings  -EKG: AF, rate controlled with no ischemic changes.   -Continue atenolol 50 mg/d  -Start Ranexa 500 mg BID   -Recommend NST/PET as an OP, followed by [facility]      CAD: NSTEMI 07/12/21, LHC as above   -Continue Plavix 75 mg/d  -Continue BB as above  -Continue HI statin   -Start antianginal as above     Hypertension: BP at goal   -Continue home regimen     Persistent atrial fibrillation: Rate controlled   -CHADsVASC 2 (HTN, DM)  -Continue OAC with Xarelto -no bleeding issues     Hyperlipidemia:   -Continue HI statin and     DM type II: last A1c 10.6  -recently started on insulin pump  -Reinforced importance of tight glucose control on cardiac health     Schizophrenia: Increase anxiety over the past few months   -Continue current RX regimen   -Long discussion with patient, recommend making a PCP appointment to discuss treatment modalities for anxiety.     Thank you for involving [place] in the care of this patient.The patient will need a 2 week follow up at [facility] upon discharge.   Please do not hesitate to call me or Dr. Nelson Chimes if additional questions arise.    Larena Sox, APRN - CNP  02/19/2022

## 2022-02-19 NOTE — ED Triage Notes (Signed)
States he has had chest pains since this past Sunday (4 days ago)

## 2022-02-20 LAB — EKG 12-LEAD
Q-T Interval: 416 ms
QRS Duration: 80 ms
QTc Calculation (Bazett): 429 ms
R Axis: 82 degrees
T Axis: 85 degrees
Ventricular Rate: 64 {beats}/min

## 2022-05-27 NOTE — ED Notes (Signed)
Formatting of this note might be different from the original.  Assumed care of pt for d/c.     Pain assessment on discharge was WDL.  Condition stable.  Patient discharged to home.  Patient education was completed:  yes  Education taught to:  patient  Teaching method used was discussion.  Understanding of teaching was good.  Patient was discharged ambulatory.  Discharged with self.  Valuables were given to: patient.    Electronically signed by Jeannett Senior, RN at 05/27/2022 11:20 AM EST

## 2022-05-27 NOTE — ED Notes (Signed)
Formatting of this note might be different from the original.  Repeat troponin collected and sent to lab.   Electronically signed by Jeannett Senior, RN at 05/27/2022  8:27 AM EST

## 2022-05-27 NOTE — ED Triage Notes (Signed)
Formatting of this note might be different from the original.  X4 days of chest pain.  Electronically signed by Alta Corning, RN at 06/13/2022 12:13 AM EST

## 2022-05-27 NOTE — ED Provider Notes (Signed)
Formatting of this note is different from the original.  Images from the original note were not included.    [name]    Time of Arrival:   05/27/22 [id number]    Final diagnoses:   [R07.89] Non-cardiac chest pain (Primary)     Medical Decision Making:      Differential Diagnosis:   pt presents to the ER c/o reproducible left sided focal chest pain likely not ACS, aorta emergency or PE. Was concerned as he had a NSTEMI in Jan this year and wanted to make sure that wasn't happening again. Reports viral illness a couple of weeks ago. Likely costochondritis but with his hx will get EKG and trops to eval for atypical presentation. Doesn't sound GI in nature. No reported trauma. No positional doubt pericarditis. No hammman's crunch or e/o pneumonediatstinum on xray.     Social Determinants of Health:  social factors reviewed, did not limit treatment                               Supplemental Historians include:  patient    ED Course:   Pt seen and examined  V/S reassuring   PE reassuring     EKG w/no e/oa cute ischemia or infarct    Labs  XR     ED Course as of 06/03/22 0940   Tue May 27, 2022   1033 Troponin (T) Quant High Sensitivity (5th Gen): 14 [LF]     Pt reports he feels relieved there is no e/o him having a heart attack. Discussed tylenol and heat packs for the pain. Avoid NSAIDS. Discussed strict return precautions. Pt is in agreement with the plan, verbalized understanding and had no further questions for myself at this time.     Documentation/Prior Results Review:  Old medical records, Previous electrocardiograms, Nursing notes    Cath report Jan 2023    Rhythm interpretation from monitor: normal sinus rhythm    Imaging Interpreted by me: X-Ray no pulm edema, no ptx     CHEST PA  AND LATERAL   Final Result     Stable minimal atelectasis/scarring lingula, no new active cardiopulmonary disease.     Signed By: Ladonna Snide, MD on 05/27/2022 7:43 AM       EKG 12 LEAD UNIT PERFORMED          .     Discussion of Management with other Physicians, QHP or Appropriate Source:   None    .    Disposition:  Home    New Prescriptions    No medications on file     Chief Complaint   Patient presents with    CHEST PAIN (ADULT)     HPI     Jordan Weiss is a 51 y.o. male w/sig PMHx of WPW, HTN NSTEMI with stent placement in Jan 2023 who presents to the ED c/o anterior chest wall pain for the past week. Pt reports is sharp, doesn't radiate. No numbness or tingling. Hurts with palpation. Doesn't reproduce with breathing or movement. Not positional. Denies any F/C, cough,SOB, DOE, leg swelling, PND, palpitations. Pt reports a couple of weeks ago had a "cold" for a couple of days. Hasn't had pain like this before.  Pt wanted to be evaluated as he has a heart attack in January and wanted to make sure it wasn't happening again. Pt had different pain then c/t today. Pt denies any  long car trips, leg swelling, air travel, hx of blood clots in himself or his family he is aware of.     Review of Systems    Physical Exam  Vitals and nursing note reviewed.   HENT:      Head: Normocephalic and atraumatic.   Eyes:      General: No scleral icterus.     Conjunctiva/sclera: Conjunctivae normal.   Cardiovascular:      Rate and Rhythm: Normal rate and regular rhythm.      Heart sounds: S1 normal and S2 normal. No murmur heard.     No friction rub. No gallop.      Comments: No hamman's crunch   Pulmonary:      Effort: Pulmonary effort is normal.      Breath sounds: Normal breath sounds. No wheezing or rales.   Chest:     Abdominal:      General: There is no distension.      Palpations: Abdomen is soft.      Tenderness: There is no abdominal tenderness.   Musculoskeletal:         General: Normal range of motion.      Cervical back: Neck supple.      Right lower leg: No edema.      Left lower leg: No edema.   Skin:     General: Skin is warm and dry.   Neurological:      Mental Status: He is alert and oriented to person, place,  and time.     Past Medical History:   Diagnosis Date    Arthritis 1995    Atrial fibrillation ([place])     pt states it was mentioned 3 years ago    Cancer of kidney ([place])     COVID 07/11/2021    Decreased exercise tolerance     SOB    Depression 1993    Heart attack ([place]) 1996    Hiatal hernia 2014    History of emotional problems     Hypertension     Loose, teeth     MISSING TEETH AND A BROKEN TOOTH    PONV (postoperative nausea and vomiting)     Recurrent major depressive disorder, in partial remission ([place]) 07/11/2021    Schizophrenia ([place]) 07/12/2021    WPW (Wolff-Parkinson-White syndrome)      Past Surgical History:   Procedure Laterality Date    BACK SURGERY  12/2018    COLONOSCOPY      GASTRIC BYPASS      HERNIA REPAIR      NEPHRECTOMY PARTIAL Left 11/29/2019    Procedure: NEPHRECTOMY, PARTIAL;  Surgeon: Rodney Cruise, MD    ORTHOPEDIC SURGERY      ROTATOR CUFF REPAIR       Family History   Problem Relation Age of Onset    Heart Disease Father     Hypertension Father     Lung Cancer Father     Colon Polyp Father     Stroke Mother     Cancer, Breast Mother     Diabetes Mother     Hypertension Mother     Colon Polyp Mother     Heart Disease Mother      Social History     Occupational History    Not on file   Tobacco Use    Smoking status: Former     Types: Cigarettes     Quit date: 2021     Years since quitting:  2.9    Smokeless tobacco: Former     Quit date: 04/18/2015   Vaping Use    Vaping Use: Never used   Substance and Sexual Activity    Alcohol use: No     Alcohol/week: 0.0 standard drinks of alcohol    Drug use: No    Sexual activity: Not on file     No outpatient medications have been marked as taking for the 05/27/22 encounter [facility] Encounter).     Allergies   Allergen Reactions    Heparin anaphylaxis/angioedema     Vital Signs:  Patient Vitals for the past 72 hrs:   Temp Heart Rate Resp BP BP Mean SpO2   05/27/22 0824 -- 65 16 110/69 83 MM HG 97 %   05/27/22 0646 98.6 F (37 C) -- 16 107/61  76 MM HG 98 %     Diagnostics:  Labs:    Results for orders placed or performed during the hospital encounter of [id number]   BASIC METABOLIC PANEL   Result Value Ref Range    Potassium 4.5 3.5 - 5.5 mmol/L    Sodium 140 133 - 145 mmol/L    Chloride 101 98 - 110 mmol/L    Glucose 157 (H) 70 - 99 mg/dL    Calcium 9.0 8.4 - 10.5 mg/dL    BUN 14 6 - 22 mg/dL    Creatinine 0.7 0.5 - 1.2 mg/dL    CO2 25 20 - 32 mmol/L    eGFR >60.0 >60.0 mL/min/1.73 sq.m.    Anion Gap 14.0 3.0 - 15.0 mmol/L   CBC WITH DIFFERENTIAL AUTO   Result Value Ref Range    WBC 7.3 4.0 - 11.0 K/uL    RBC 5.02 3.80 - 5.80 M/uL    HGB 13.4 13.1 - 17.2 g/dL    HCT 41.8 39.3 - 51.6 %    MCV 83 80 - 95 fL    MCH 27 26 - 34 pg    MCHC 32 31 - 36 g/dL    RDW 15.4 10.0 - 15.5 %    Platelet 195 140 - 440 K/uL    MPV 10.2 9.0 - 13.0 fL    Segmented Neutrophils (Auto) 70 40 - 75 %    Lymphocytes (Auto) 23 20 - 45 %    Monocytes (Auto) 6 3 - 12 %    Eosinophils (Auto) 2 0 - 6 %    Basophils (Auto) 0 0 - 2 %    Absolute Neutrophils (Auto) 5.1 1.8 - 7.7 K/uL    Absolute Lymphocytes (Auto) 1.6 1.0 - 4.8 K/uL    Absolute Monocytes (Auto) 0.4 0.1 - 1.0 K/uL    Absolute Eosinophils (Auto) 0.1 0.0 - 0.5 K/uL    Absolute Basophils (Auto) 0.0 0.0 - 0.2 K/uL   Troponin Care Path   Result Value Ref Range    Troponin (T) Quant High Sensitivity (5th Gen) 14 0 - 19 ng/L   Troponin 1 HR   Result Value Ref Range    Troponin (T) Quant High Sensitivity (5th Gen) 14 0 - 19 ng/L     ECG:  Results for orders placed or performed during the hospital encounter of 05/27/22   EKG 12 LEAD UNIT PERFORMED   Result Value Ref Range Status    Heart Rate 79 bpm Preliminary    RR Interval 763 ms Preliminary    Atrial Rate 87 ms Preliminary    P-R Interval  ms Preliminary    P Duration  0 ms Preliminary    P Horizontal Axis  deg Preliminary    P Front Axis  deg Preliminary    Q Onset 499 ms Preliminary    QRSD Interval 83 ms Preliminary    QT Interval 393 ms Preliminary    QTcB 450 ms Preliminary     QTcF 431 ms Preliminary    QRS Horizontal Axis -27 deg Preliminary    QRS Axis 54 deg Preliminary    I-40 Front Axis 32 deg Preliminary    t-40 Horizontal Axis -63 deg Preliminary    T-40 Front Axis 95 deg Preliminary    T Horizontal Axis 38 deg Preliminary    T Wave Axis 67 deg Preliminary    S-T Horizontal Axis 80 deg Preliminary    S-T Front Axis 165 deg Preliminary    Impression - ABNORMAL ECG -  Preliminary    Impression   Preliminary     AFIB-Atrial fibrillation-V-rate 64-93, irreg A-activity     Medications ordered/given in the ED  Medications   NS Flush injection 10 mL (has no administration in time range)       Electronically signed by Joycelyn Schmid, MD at 06/03/2022  9:49 AM EST

## 2022-08-07 NOTE — Telephone Encounter (Signed)
Formatting of this note is different from the original.  Last Refill Date: 03/10/22     Last office Visit:  08/13/2021    BP Readings from Last 2 Encounters:   05/27/22 119/59   03/18/22 132/77       Basic Metabolic Profile  Lab Results   Component Value Date    NA 140 05/27/2022    POTASSIUM 4.5 05/27/2022    CHLORIDE 101 05/27/2022    CO2 25 05/27/2022    BUN 14 05/27/2022    CREAT 0.7 05/27/2022    GLUCOSE 157 (H) 05/27/2022    CALCIUM 9.0 05/27/2022    MAGNESIUM 1.8 07/12/2021       Electronically signed by Sherron Flemings at 08/07/2022  9:38 AM EST

## 2022-08-11 ENCOUNTER — Inpatient Hospital Stay: Admit: 2022-08-11 | Discharge: 2022-08-11 | Disposition: A | Discharge status: Home / Self Care | Payer: MEDICAID

## 2022-08-11 DIAGNOSIS — S39012A Strain of muscle, fascia and tendon of lower back, initial encounter: Secondary | ICD-10-CM

## 2022-08-11 NOTE — ED Provider Notes (Signed)
[facility] EMERGENCY DEPT  EMERGENCY DEPARTMENT ENCOUNTER      Pt Name: Jordan Weiss  MRN: [id number]  [place] 1971-06-13  Date of evaluation: 08/11/2022  Provider: Vela Prose, MD  7:41 AM    CHIEF COMPLAINT       Chief Complaint   Patient presents with    Back Pain    Hip Pain         HISTORY OF PRESENT ILLNESS    Jordan Weiss is an obese man 52 y.o. male with past medical history significant for CAD, renal carcinoma in remission, degenerative disc disease, previous lumbar surgery, atrial fibrillation on Xarelto who presents to the emergency department for evaluation of sudden onset, sharp spasm-like right buttock and right lower back pain after moving some 100 pound railroad ties a few days ago with his son and son-in-law.  Aggravated by bending and twisting.  Not entirely alleviated by his prescription opioids from his pain management physician as well as Flexeril.  Is here at the request of his wife to be evaluated.  Patient otherwise feels well without complaint of saddle anesthesia, urinary fecal incontinence retention.    The history is provided by the patient and medical records.       Nursing Notes were reviewed.    REVIEW OF SYSTEMS       Review of Systems   All other systems reviewed and are negative.      Except as noted above the remainder of the review of systems was reviewed and negative.       PAST MEDICAL HISTORY     Past Medical History:   Diagnosis Date    CAD (coronary artery disease)     Cancer ([place])     kidney    Chronic pain     DDD (degenerative disc disease), lumbar     Diabetes ([place])     Hypertension     Melanoma ([place])     [place]    OA (osteoarthritis) of knee     Renal lesion          SURGICAL HISTORY       Past Surgical History:   Procedure Laterality Date    BACK SURGERY  07/2019    GASTRIC BYPASS SURGERY      GASTRIC BYPASS SURGERY  2005    HERNIA REPAIR  2015    ORTHOPEDIC SURGERY  2013    fractured back    OTHER SURGICAL HISTORY Left 1989    degloved injury    OTHER SURGICAL HISTORY  Bilateral 2012, 2014    Rotator cuff    SHOULDER ARTHROSCOPY      rotator cuff repair x 2         CURRENT MEDICATIONS       Discharge Medication List as of 08/11/2022  6:23 PM        CONTINUE these medications which have NOT CHANGED    Details   tamsulosin (FLOMAX) 0.4 MG capsule TAKE 1 CAPSULE BY MOUTH AFTER SUPPERHistorical Med      dicyclomine (BENTYL) 20 MG tablet TAKE 1 TABLET BY MOUTH 4 TIMES DAILY AS NEEDEDHistorical Med      aspirin 81 MG chewable tablet Take 1 tablet by mouth dailyHistorical Med      albuterol sulfate HFA (PROVENTIL;VENTOLIN;PROAIR) 108 (90 Base) MCG/ACT inhaler TAKE 1-2 PUFFS INHALED BY MOUTH EVERY 6 HOURS.Historical Med      empagliflozin (JARDIANCE) 10 MG tablet Take 1 tablet by mouth every morningHistorical Med  dulaglutide (TRULICITY) 1.5 0000000 SC injection INJECT 1 SYRINGE SUBCUTANEOUSLY ONCE A WEEKHistorical Med      rivaroxaban (XARELTO) 20 MG TABS tablet Take by mouthHistorical Med      acetaminophen (TYLENOL) 325 MG tablet Take by mouth every 4 hours as neededHistorical Med      atenolol (TENORMIN) 25 MG tablet Take 25 mg by mouth dailyHistorical Med      atorvastatin (LIPITOR) 20 MG tablet Take 20 mg by mouthHistorical Med      cyclobenzaprine (FLEXERIL) 10 MG tablet Take 10 mg by mouth every 8 hours as neededHistorical Med      docusate (COLACE, DULCOLAX) 100 MG CAPS Take 100 mg by mouth dailyHistorical Med      enalapril (VASOTEC) 20 MG tablet Take 20 mg by mouth dailyHistorical Med      hydroCHLOROthiazide (HYDRODIURIL) 25 MG tablet Take 25 mg by mouth dailyHistorical Med      nitroGLYCERIN (NITROSTAT) 0.4 MG SL tablet Place 0.4 mg under the tongueHistorical Med      omeprazole (PRILOSEC) 20 MG delayed release capsule TAKE 1 CAPSULE BY MOUTH ONCE DAILYHistorical Med             ALLERGIES     Heparin    FAMILY HISTORY       Family History   Problem Relation Age of Onset    Diabetes Mother     Cancer Mother         breast    Hypertension Brother     Heart Attack Brother      Heart Attack Brother     Hypertension Brother     Stroke Brother     Diabetes Brother     Hypertension Sister     Diabetes Brother     Hypertension Brother     Kidney Disease Brother         had transplant    Hypertension Sister     Heart Failure Sister     Cancer Sister         cervical cancer    Osteoporosis Sister     Hypertension Sister     Diabetes Sister     Stroke Father     Diabetes Father     Heart Disease Mother     Cancer Father         lung          SOCIAL HISTORY       Social History     Socioeconomic History    Marital status: Married     Spouse name: None    Number of children: None    Years of education: None    Highest education level: None   Tobacco Use    Smoking status: Former    Smokeless tobacco: Never   Brewing technologist Use: Former   Substance and Sexual Activity    Alcohol use: Not Currently     Alcohol/week: 0.0 standard drinks of alcohol    Drug use: No       SCREENINGS         Glasgow Coma Scale  Eye Opening: Spontaneous  Best Verbal Response: Oriented  Best Motor Response: Obeys commands  Glasgow Coma Scale Score: 15                     CIWA Assessment  BP: 121/77  Pulse: 79                 PHYSICAL  EXAM       ED Triage Vitals [08/11/22 1736]   BP Temp Temp Source Pulse Respirations SpO2 Height Weight - Scale   121/77 98.4 F (36.9 C) Oral 79 19 99 % 1.905 m (6' 3"$ ) 131.5 kg (290 lb)       Physical Exam  Vitals and nursing note reviewed.   Constitutional:       General: He is not in acute distress.     Appearance: Normal appearance. He is obese. He is not ill-appearing, toxic-appearing or diaphoretic.   HENT:      Head: Normocephalic and atraumatic.   Cardiovascular:      Rate and Rhythm: Normal rate and regular rhythm.      Pulses: Normal pulses.      Heart sounds: Normal heart sounds. No murmur heard.     No friction rub. No gallop.   Pulmonary:      Effort: Pulmonary effort is normal. No respiratory distress.      Breath sounds: Normal breath sounds. No stridor. No wheezing,  rhonchi or rales.   Chest:      Chest wall: No tenderness.   Abdominal:      General: Abdomen is flat. There is no distension.      Palpations: Abdomen is soft. There is no mass.      Tenderness: There is no abdominal tenderness. There is no right CVA tenderness, left CVA tenderness, guarding or rebound.      Hernia: No hernia is present.   Musculoskeletal:         General: Tenderness and signs of injury present. No swelling or deformity. Normal range of motion.      Right lower leg: No edema.      Left lower leg: No edema.      Comments: Reproducible right lateral buttock and distal right paralumbar tenderness to palpation.  Straight leg raise negative bilateral.  No midline back pain.   Skin:     General: Skin is warm and dry.      Capillary Refill: Capillary refill takes less than 2 seconds.      Coloration: Skin is not jaundiced or pale.      Findings: No bruising, erythema, lesion or rash.   Neurological:      General: No focal deficit present.      Mental Status: He is alert and oriented to person, place, and time.      Cranial Nerves: No cranial nerve deficit.      Sensory: No sensory deficit.      Motor: No weakness.      Gait: Gait normal.         DIAGNOSTIC RESULTS     EKG: All EKG's are interpreted by the Emergency Department Physician who either signs or Co-signs this chart in the absence of a cardiologist.        RADIOLOGY:   Non-plain film images such as CT, Ultrasound and MRI are read by the radiologist. Plain radiographic images are visualized and preliminarily interpreted by the emergency physician with the below findings:        Interpretation per the Radiologist below, if available at the time of this note:    No orders to display         ED BEDSIDE ULTRASOUND:   Performed by ED Physician - none    LABS:  Labs Reviewed - No data to display    All other labs were within normal range or not returned as of this  dictation.    EMERGENCY DEPARTMENT COURSE and DIFFERENTIAL DIAGNOSIS/MDM:   Vitals:     Vitals:    08/11/22 1736   BP: 121/77   Pulse: 79   Resp: 19   Temp: 98.4 F (36.9 C)   TempSrc: Oral   SpO2: 99%   Weight: 131.5 kg (290 lb)   Height: 1.905 m (6' 3"$ )           Medical Decision Making  Primary concern for lumbar and right buttock strain.  Straight leg raise negative bilateral.  No midline back pain.  Normal neurological exam.  Patient was given follow-up instructions with his primary care provider, orthopedic surgeon as well as his pain management physician to discuss outpatient MRI if the pain persists.  Patient was recommended to take his medications as prescribed, topical agents and supportive care measures since he has a previous gastric bypass surgery.    Problems Addressed:    Differential Diagnosis: Lumbar strain, disc herniation  Ruled In: Lumbar strain, buttock strain  [employer]: Disc herniation, disc/cord compression    Data Complexity:   QHP Sources:  Summarizat vital signs ion of Records Reviewed: I have reviewed the patient's most up-to-date medication list, allergies, social history  Results of Studies and Effect on Management:  Independent Interpretation: Vital signs    Risk Element/Consideration:    Admission or Observation: N  Follow Up Care Discussed: Primary care  Studies:   Meds Given or Considered: Topical agent  Reassessment:    Social Determinants Affecting Care:           Amount and/or Complexity of Data Reviewed  External Data Reviewed: labs, radiology, ECG and notes.      FINAL IMPRESSION      1. Strain of lumbar region, initial encounter          DISPOSITION/PLAN   DISPOSITION Decision To Discharge 08/11/2022 06:03:26 PM      PATIENT REFERRED TO:  Lonia Mad, [place]  [place] [address]  [phone]    Schedule an appointment as soon as possible for a visit       Bary Richard, MD  [address]  [address]  [place] [address]  [phone]    Schedule an appointment as soon as possible for a visit       Orlena Sheldon, MD  [place] [address]  [phone]    Schedule an appointment as soon as possible for a visit         DISCHARGE MEDICATIONS:  Discharge Medication List as of 08/11/2022  6:23 PM        Controlled Substances Monitoring:          No data to display                (Please note that portions of this note were completed with a voice recognition program.  Efforts were made to edit the dictations but occasionally words are mis-transcribed.)    Vela Prose, MD (electronically signed)  Attending Emergency Physician            [name], MD  08/12/22 [phone]

## 2022-08-11 NOTE — ED Triage Notes (Signed)
Pt reports lower back pain and right hip that started today after lifting/carrying something heavy today. Reports he pulled something.

## 2022-08-19 ENCOUNTER — Encounter

## 2022-08-19 ENCOUNTER — Ambulatory Visit: Admit: 2022-08-19 | Discharge: 2022-08-19 | Payer: MEDICARE | Attending: Family | Primary: Family

## 2022-08-19 DIAGNOSIS — I10 Essential (primary) hypertension: Secondary | ICD-10-CM

## 2022-08-19 MED ORDER — TAMSULOSIN HCL 0.4 MG PO CAPS
0.4 MG | ORAL_CAPSULE | ORAL | 0 refills | Status: AC
Start: 2022-08-19 — End: 2022-12-29

## 2022-08-19 MED ORDER — ALBUTEROL SULFATE HFA 108 (90 BASE) MCG/ACT IN AERS
108 | RESPIRATORY_TRACT | 1 refills | Status: AC
Start: 2022-08-19 — End: ?

## 2022-08-19 NOTE — Progress Notes (Addendum)
Sees Jordan Dubin, NP for Diabetes. [name]. Needs referral to Pulmonology for COPD. Colace doesn't help and he needs a new stool softener.     Chief Complaint   Patient presents with    New Patient       "Have you been to the ER, urgent care clinic since your last visit?  Hospitalized since your last visit?"    [facility], back pain    "Have you seen or consulted any other health care providers outside of [place] since your last visit?"    Jordan Weiss few weeks ago    "Have you had a colorectal cancer screening such as a colonoscopy/FIT/Cologuard?    NO     Referral to GI entered   Patient instructed to have eye exam

## 2022-08-19 NOTE — Progress Notes (Signed)
Darvon Ueland (DOB: [birth year]) is a 52 y.o. male patient, here for evaluation of the following chief complaint(s):  New Patient       ASSESSMENT/PLAN:  Below is the assessment and plan developed based on review of pertinent history, physical exam, labs, studies, and medications.      Blood pressure well-controlled  Referral to GI for colonoscopy and possible endoscopy  CT of the head ordered for concern of memory changes referral to neurology  Patient is managed by pain management for lumbar issues  Patient sees cardiology [facility], for cardiac issues [place] Xarelto management  Patient sees [name] end[facility]inology for management of diabetes    Referred to pulmonology for further workup of lung issues possible emphysema COPD    Medications as prescribed      Follow-up in approximately 2 weeks sooner if indicated to review all test findings and discuss all referrals            1. Essential hypertension  2. Screen for colon cancer  -     [facility] - Gibson Ramp, MD, Gastroenterology, [place]  3. Longstanding persistent atrial fibrillation (HCC)  -     EKG 12 Lead; Future  4. Memory change  -     CT HEAD WO CONTRAST; Future  -     External Referral To Neurology  5. Lumbosacral spondylosis without myelopathy  6. Benign prostatic hyperplasia, unspecified whether lower urinary tract symptoms present  -     tamsulosin (FLOMAX) 0.4 MG capsule; TAKE 1 CAPSULE BY MOUTH AFTER SUPPER, Disp-90 capsule, R-0Normal  7. Pulmonary emphysema, unspecified emphysema type (HCC)  -     [facility] - Asah, Derick, DO, Pulmonology, [place] ([address])  -     albuterol sulfate HFA (PROVENTIL;VENTOLIN;PROAIR) 108 (90 Base) MCG/ACT inhaler; TAKE 1-2 PUFFS INHALED BY MOUTH EVERY 6 HOURS., Disp-18 g, R-1Normal  8. Gastroesophageal reflux disease without esophagitis    No results found for any visits on 08/19/22.     No follow-ups on file.      I have discussed the diagnosis with the patient and the intended plan as seen in the above orders.   Questions were answered concerning future plans.  I have discussed medication side effects and warnings with the patient as well. I have reviewed the plan of care with the patient, accepted their input and they are in agreement with the treatment goals. Previous lab and imaging results were reviewed by me.     I have performed a medically appropriate exam, ordering tests and medications, counseling and educating, and documenting in the EMR     I recommend this patient have regular follow-up appointments every   months for general health evaluations and management   Sooner if indicated     Past Medical History  Past Medical History:   Diagnosis Date    CAD (coronary artery disease)     Cancer ([place])     kidney    Chronic pain     DDD (degenerative disc disease), lumbar     Diabetes ([place])     Hypertension     Melanoma ([place])     [place]    OA (osteoarthritis) of knee     Renal lesion           SUBJECTIVE/OBJECTIVE:    HPI: 52 year old male presents to the office for establishment of care lab work and primary care management  Patient sees [name] endocrinology to manage his diabetes  The patient states he has a  concern that he may have early Alzheimer's, he states he was told this was possible by his last primary, never seen neurology never had radiology exams to evaluate brain such as CT.  Patient states for about 1 year he has noticed he cannot remember things in the past or recent things he forgets where he puts things he makes notes to himself to remember things but then he does not even remember he made the note or where he put it he says he cannot remember certain things in his past either.    The patient sees Dr. [name] for pain management who does give him Tylenol 3 and Flexeril patient states he has severe degenerative disc disease he may need more surgery on his back he has seen Dr. Hassell Done in the past    Patient does see cardiology [facility], he was there about 4 months ago and he thinks he has an  appointment soon  He takes Xarelto for atrial fibs and he states he had a myocardial infarction about 1 year ago        No report today of illness such as fever chills cough shortness of breath palpitations leg swelling muscle pain rash fainting or weakness                ROS:      Constitutional: Negative for fever, chills ,fatigue, unexplained weight gain/loss  HENT:  Negative for ear pain,postnasal drip, rhinitis,  Eyes:  Negative for visual disturbance,   Respiratory:  Negative for cough and shortness of breath,wheezing ,congestion  Cardiovascular:  Negative for chest pain, palpitations and leg swelling.   Gastrointestinal:  Negative for abdominal pain, constipation, diarrhea, nausea ,vomiting.   Musculoskeletal:  Negative for arthralgias, back pain and gait problem. Joint pain, muscle pain  Skin:  Negative for rash, lesions,  Neurological:  Negative for fainting,dizziness, weakness, headaches,numbness       Current Outpatient Medications   Medication Sig    acetaminophen-codeine (TYLENOL #3) 300-30 MG per tablet Take 2 tablets by mouth every 8 hours as needed.    TRULICITY 3 0000000 SOPN Inject 3 mg as directed Once a week at 5 PM    fluticasone-salmeterol (ADVAIR DISKUS) 250-50 MCG/ACT AEPB diskus inhaler Inhale 1 puff into the lungs 2 times daily    fenofibrate (TRICOR) 145 MG tablet Take 1 tablet by mouth daily    vitamin D (ERGOCALCIFEROL) 1.25 MG (50000 UT) CAPS capsule Take 1 capsule by mouth once a week    FEROSUL 325 (65 Fe) MG tablet Take 1 tablet by mouth daily (with breakfast)    vitamin C (ASCORBIC ACID) 500 MG tablet Take 1 tablet by mouth daily    insulin lispro (HUMALOG) 100 UNIT/ML SOLN injection vial Inject into the skin Patient has insulin pump    omeprazole (PRILOSEC) 40 MG delayed release capsule Take 1 capsule by mouth daily    albuterol sulfate HFA (PROVENTIL;VENTOLIN;PROAIR) 108 (90 Base) MCG/ACT inhaler TAKE 1-2 PUFFS INHALED BY MOUTH EVERY 6 HOURS.    tamsulosin (FLOMAX) 0.4 MG capsule  TAKE 1 CAPSULE BY MOUTH AFTER SUPPER    empagliflozin (JARDIANCE) 10 MG tablet Take 1 tablet by mouth every morning    dicyclomine (BENTYL) 20 MG tablet TAKE 1 TABLET BY MOUTH 4 TIMES DAILY AS NEEDED    aspirin 81 MG chewable tablet Take 1 tablet by mouth daily    rivaroxaban (XARELTO) 20 MG TABS tablet Take by mouth    atenolol (TENORMIN) 25 MG tablet Take 1  tablet by mouth daily    atorvastatin (LIPITOR) 20 MG tablet Take 1 tablet by mouth    cyclobenzaprine (FLEXERIL) 10 MG tablet Take 1 tablet by mouth every 8 hours as needed    docusate (COLACE, DULCOLAX) 100 MG CAPS Take 100 mg by mouth daily    enalapril (VASOTEC) 20 MG tablet Take 1 tablet by mouth daily    hydroCHLOROthiazide (HYDRODIURIL) 25 MG tablet Take 1 tablet by mouth daily    nitroGLYCERIN (NITROSTAT) 0.4 MG SL tablet Place 1 tablet under the tongue     No current facility-administered medications for this visit.       BP 110/70   Pulse 70   Temp 97.3 F (36.3 C) (Temporal)   Resp 18   Ht 1.905 m ('6\' 3"'$ )   Wt (!) 140.6 kg (310 lb)   SpO2 96%   BMI 38.75 kg/m            General:  alert, cooperative, well appearing, in no apparent distress.  Eyes:  Pupils are equally round and reactive to light with accommodation.  The extra-ocular movements are intact.  The lids are without swelling, lesions, or drainage.  The conjunctiva is clear and noninjected.  ENT:    The tongue and mucous membranes are pink and moist without lesions.  The pharynx is non-erythematous without exudates.  Neck:  There is normal range of motion.  The thyroid is normal size without nodules or masses.  There is no lymphadenopathy.  CV:  The heart sounds are irregular, regular in rate and rhythm.  There is a normal S1 and S2.  There or no murmurs, rubs, or gallops.  Distal pulses are intact and equal.  Lungs:  Inspiratory and expiratory efforts are full and unlabored.  Lung sounds are clear and equal to auscultation throughout all lung fields without wheezing, rales, or  rhonchi.  GI:  The abdomen is soft and nontender.  Bowel sounds are present in all 4 quadrants.  There is no hepatosplenomegaly or other masses.  There is no rebound or guarding.  There is no CVA or suprapubic tenderness..  Extremities:  There is no clubbing, cyanosis, or edema.  3+ peripheral pulses.cap refill less than 2 seconds   Neurological:    There is no obvious focal sensory or motor deficits.   Strength is 5/5 in the upper and lower extremity bilaterally.          This chart was prepared using voice recognition software and may contain unintended word substitution errors    An electronic signature was used to authenticate this note.  -- Princella Pellegrini, FNP

## 2022-08-20 ENCOUNTER — Inpatient Hospital Stay: Admit: 2022-08-20 | Payer: MEDICAID | Primary: Family

## 2022-08-20 ENCOUNTER — Inpatient Hospital Stay: Admit: 2022-08-20 | Primary: Family

## 2022-08-20 DIAGNOSIS — I4811 Longstanding persistent atrial fibrillation: Secondary | ICD-10-CM

## 2022-08-20 LAB — LABCORP SPECIMEN COLLECTION

## 2022-08-20 LAB — EKG 12-LEAD
Q-T Interval: 418 ms
QRS Duration: 86 ms
QTc Calculation (Bazett): 427 ms
R Axis: 67 degrees
T Axis: 65 degrees
Ventricular Rate: 63 {beats}/min

## 2022-08-20 NOTE — Other (Signed)
EKG without any changes in comparison  Known atrial fibs controlled rate  Not critical or urgent finding  Will discuss in detail 319 follow-up appointment

## 2022-08-21 LAB — CBC
Hematocrit: 41.5 % (ref 37.5–51.0)
Hemoglobin: 13.2 g/dL (ref 13.0–17.7)
MCH: 27 pg (ref 26.6–33.0)
MCHC: 31.8 g/dL (ref 31.5–35.7)
MCV: 85 fL (ref 79–97)
Platelets: 184 10*3/uL (ref 150–450)
RBC: 4.89 x10E6/uL (ref 4.14–5.80)
RDW: 15.4 % (ref 11.6–15.4)
WBC: 5.6 10*3/uL (ref 3.4–10.8)

## 2022-08-21 LAB — COMPREHENSIVE METABOLIC PANEL
ALT: 18 IU/L (ref 0–44)
AST: 26 IU/L (ref 0–40)
Albumin/Globulin Ratio: 1.8 (ref 1.2–2.2)
Albumin: 4.3 g/dL (ref 3.8–4.9)
Alkaline Phosphatase: 120 IU/L (ref 44–121)
BUN/Creatinine Ratio: 13 (ref 9–20)
BUN: 11 mg/dL (ref 6–24)
CO2: 21 mmol/L (ref 20–29)
Calcium: 8.8 mg/dL (ref 8.7–10.2)
Chloride: 103 mmol/L (ref 96–106)
Creatinine: 0.88 mg/dL (ref 0.76–1.27)
Est, Glom Filt Rate: 102 mL/min/{1.73_m2} (ref >59)
Globulin, Total: 2.4 g/dL (ref 1.5–4.5)
Glucose: 175 mg/dL — ABNORMAL HIGH (ref 70–99)
Potassium: 4.2 mmol/L (ref 3.5–5.2)
Sodium: 141 mmol/L (ref 134–144)
Total Bilirubin: 0.4 mg/dL (ref 0.0–1.2)
Total Protein: 6.7 g/dL (ref 6.0–8.5)

## 2022-08-21 LAB — LIPID PANEL
Cholesterol: 121 mg/dL (ref 100–199)
HDL: 45 mg/dL (ref >39)
LDL Calculated: 51 mg/dL (ref 0–99)
Triglycerides: 146 mg/dL (ref 0–149)
VLDL Cholesterol Calculated: 25 mg/dL (ref 5–40)

## 2022-08-21 NOTE — Other (Signed)
Lab work reviewed  No critical no urgent findings  Patient has follow-up appointment 09/02/2022 we will review in detail

## 2022-08-27 NOTE — Telephone Encounter (Signed)
Referral for screening colonoscopy. Please review and advise.      Referral  Referral # [id number]  Referral Information    Referral # Creation Date Referral Status Status Update    [id number] 08/19/2022 In Progress 08/27/2022: Status History     Status Reason Referral Type Referral Reasons Referral Class   none Eval and Treat Specialty Services Required Internal     To Specialty To Provider To Location/Place of Service To Department   Gastroenterology Benard Halsted, MD none [facility] - GASTRO[facility]OLOG[facility]     To Vendor Referred By By Location/Place of Service By Department   none Princella Pellegrini, FNP [facility] [place] [facility]     Priority Start Date Expiration Date Referral Entered By   Routine 08/19/2022 08/19/2023 Princella Pellegrini, FNP     Visits Requested Visits Authorized Visits Completed Visits Scheduled   2 2       [place]. Required? Covered? Member # Authorized From Careers information officer. # Comment   [employer] [place] [employer] [place] [employer] -- Covered [id number] 10/14/2020 -- -- -- --     Referral Information    Referral # Creation Date Referral Status Status Update    [id number] 08/19/2022 In Progress 08/27/2022: Status History     Status Reason Referral Type Referral Reasons Referral Class   none Eval and Treat Specialty Services Required Internal     To Specialty To Provider To Location/Place of Service To Department   Gastroenterology Benard Halsted, MD none [facility] - GASTRO[facility]OLOG[facility]     To Vendor Referred By By Location/Place of Service By Department   none Princella Pellegrini, FNP [facility] [place] [facility]     Priority Start Date Expiration Date Referral Entered By   Routine 08/19/2022 08/19/2023 Princella Pellegrini, [place]     Visits Requested Visits Authorized Visits Completed Visits Scheduled   2 2       Procedure Information    Service Details  Procedure Modifiers  Provider Requested Approved   [id number] - AMB REFERRAL TO GASTROENTEROLOGY none Hartle, Tana Conch, MD 2 2     Diagnosis Information    Diagnosis   Z12.11 (ICD-10-CM) - Screen for colon cancer     Service Level Authorizations    No service level authorizations were were found.  Referral Notes  Number of Notes: 2  .  Type Date User Summary Attachment   Specialty Comments 08/27/2022 10:05 AM Pinner, Shirlean Mylar - -   Note:  Sent to provider to review.   .  Type Date User Summary Attachment   Provider Comments 08/19/2022  9:40 AM Princella Pellegrini, FNP Provider Comments -   Note:  The patient can be scheduled with any member of the group, including the provider with the first available appointments.   Referral Order    Order   [facility] - Gibson Ramp, MD, Gastroenterology, [place] (Order # [id number]) on 08/19/2022   View Encounter        Triage Information    Decision: (none)   Priority: Routine   Schedule by date: 09/18/2022     Scheduling Instructions    [facility], * needs colonoscopy and to be seen for stomach issues as well   [facility], * needs colonoscopy and to be seen for stomach issues as well

## 2022-08-29 ENCOUNTER — Ambulatory Visit: Payer: MEDICAID | Primary: Family

## 2022-09-02 ENCOUNTER — Encounter: Admit: 2022-09-02 | Discharge: 2022-09-02 | Payer: MEDICARE | Attending: Family | Primary: Family

## 2022-09-02 DIAGNOSIS — E119 Type 2 diabetes mellitus without complications: Secondary | ICD-10-CM

## 2022-09-02 NOTE — Progress Notes (Signed)
Jordan Weiss (DOB: 07-12-1970) is a 52 y.o. male patient, here for evaluation of the following chief complaint(s):  No chief complaint on file.       ASSESSMENT/PLAN:  Below is the assessment and plan developed based on review of pertinent history, physical exam, labs, studies, and medications.    #1: [place] code patient is followed his diabetes by diabetic specialty care Jordan Weiss, [facility] code patient is on insulin pump using medications as prescribed by specialty management    Blood pressure stable and at goal continue medications as prescribed    Patient is followed by specialty care continue all specialty care follow-ups and visit    Patient's blood work is very stable follow-up in 6 months with primary care for further evaluation and chronic condition      Sooner if indicated    1. Controlled type 2 diabetes mellitus without complication, without long-term current use of insulin (Jordan Weiss)  2. Essential hypertension  -     CBC; Future  -     Comprehensive Metabolic Panel; Future  3. High cholesterol  -     Lipid Panel; Future    No results found for any visits on 09/02/22.     Return in about 6 months (around 03/05/2023).      I have discussed the diagnosis with the patient and the intended plan as seen in the above orders.  Questions were answered concerning future plans.  I have discussed medication side effects and warnings with the patient as well. I have reviewed the plan of care with the patient, accepted their input and they are in agreement with the treatment goals. Previous lab and imaging results were reviewed by me.     20 minutes, I have performed a medically appropriate exam, ordering tests and medications, counseling and educating, and documenting in the EMR     I recommend this patient have regular follow-up appointments every   months for general health evaluations and management   Sooner if indicated     Past Medical History  Past Medical History:   Diagnosis Date    CAD (coronary artery disease)      Cancer ([place])     kidney    Chronic pain     DDD (degenerative disc disease), lumbar     Diabetes ([place])     Hypertension     Melanoma ([place])     [place]    OA (osteoarthritis) of knee     Renal lesion           SUBJECTIVE/OBJECTIVE:    HPI: 52 year old male presents to the office to review lab work  Lab work is stable  Patient had a gastric bypass in 2006 and he has a concern of upper abdominal and gastric pain he also had a hernia repair.  Patient was previously referred to GI he has not heard anything we will reupdate this referral      Patient states he had a gastric bypass in 2006.          ROS:      Constitutional: Negative for fever, chills ,fatigue, unexplained weight gain/loss  HENT:  Negative for ear pain,postnasal drip, rhinitis,  Eyes:  Negative for visual disturbance,   Respiratory:  Negative for cough and shortness of breath,wheezing ,congestion  Cardiovascular:  Negative for chest pain, palpitations and leg swelling.   Gastrointestinal: Positive for mid to upper abdominal pain chronic     musculoskeletal:  Negative for arthralgias, back pain and gait problem.  Joint pain, muscle pain  Skin:  Negative for rash, lesions,  Neurological:  Negative for fainting,dizziness, weakness, headaches,numbness       Current Outpatient Medications   Medication Sig    acetaminophen-codeine (TYLENOL #3) 300-30 MG per tablet Take 2 tablets by mouth every 8 hours as needed.    TRULICITY 3 0000000 SOPN Inject 3 mg as directed Once a week at 5 PM    fluticasone-salmeterol (ADVAIR DISKUS) 250-50 MCG/ACT AEPB diskus inhaler Inhale 1 puff into the lungs 2 times daily    fenofibrate (TRICOR) 145 MG tablet Take 1 tablet by mouth daily    vitamin D (ERGOCALCIFEROL) 1.25 MG (50000 UT) CAPS capsule Take 1 capsule by mouth once a week    FEROSUL 325 (65 Fe) MG tablet Take 1 tablet by mouth daily (with breakfast)    vitamin C (ASCORBIC ACID) 500 MG tablet Take 1 tablet by mouth daily    insulin lispro (HUMALOG) 100 UNIT/ML SOLN  injection vial Inject into the skin Patient has insulin pump    omeprazole (PRILOSEC) 40 MG delayed release capsule Take 1 capsule by mouth daily    albuterol sulfate HFA (PROVENTIL;VENTOLIN;PROAIR) 108 (90 Base) MCG/ACT inhaler TAKE 1-2 PUFFS INHALED BY MOUTH EVERY 6 HOURS.    tamsulosin (FLOMAX) 0.4 MG capsule TAKE 1 CAPSULE BY MOUTH AFTER SUPPER    empagliflozin (JARDIANCE) 10 MG tablet Take 1 tablet by mouth every morning    dicyclomine (BENTYL) 20 MG tablet TAKE 1 TABLET BY MOUTH 4 TIMES DAILY AS NEEDED    aspirin 81 MG chewable tablet Take 1 tablet by mouth daily    rivaroxaban (XARELTO) 20 MG TABS tablet Take by mouth    atenolol (TENORMIN) 25 MG tablet Take 1 tablet by mouth daily    atorvastatin (LIPITOR) 20 MG tablet Take 1 tablet by mouth    cyclobenzaprine (FLEXERIL) 10 MG tablet Take 1 tablet by mouth every 8 hours as needed    docusate (COLACE, DULCOLAX) 100 MG CAPS Take 100 mg by mouth daily    enalapril (VASOTEC) 20 MG tablet Take 1 tablet by mouth daily    hydroCHLOROthiazide (HYDRODIURIL) 25 MG tablet Take 1 tablet by mouth daily    nitroGLYCERIN (NITROSTAT) 0.4 MG SL tablet Place 1 tablet under the tongue     No current facility-administered medications for this visit.       BP 108/65   Pulse 76   Temp 97.9 F (36.6 C)   Resp 18   Wt (!) 140.6 kg (310 lb)   SpO2 96%   BMI 38.75 kg/m            General:  alert, cooperative, well appearing, in no apparent distress.  Eyes:  Pupils are equally round and reactive to light with accommodation.  Lungs:  Inspiratory and expiratory efforts are full and unlabored.  Neurological:    There is no obvious focal sensory or motor deficits.        This chart was prepared using voice recognition software and may contain unintended word substitution errors    An electronic signature was used to authenticate this note.  -- Princella Pellegrini, FNP

## 2022-09-08 NOTE — Progress Notes (Addendum)
Peg prep given to the patient via telephone, emailed, and mailed.  Procedure 09-16-2022, Dx Code: z12.11. Patient made aware to stop Trulicity injections 7 days prior to procedure, stop Iron medications 7 days prior to procedure, stop Xarelto 5 days prior to procedure, and hold diabetic medications/insulins morning of procedure. Patient verbalized understanding. Lmurrrell,LPN

## 2022-09-08 NOTE — Telephone Encounter (Signed)
Formatting of this note is different from the original.  Last Refill Date: 07/29/22    Last Appt: Visit date not found    Future Appointments   Date Time Provider [place]   02/10/2023  4:05 PM Quentin Angst, NP [facility]        Lab Results   Component Value Date    NA 140 05/27/2022    POTASSIUM 4.5 05/27/2022    CHLORIDE 101 05/27/2022    CO2 25 05/27/2022    ANIONGAP 14.0 05/27/2022    BUN 14 05/27/2022    CREAT 0.7 05/27/2022    GLUCOSE 157 (H) 05/27/2022    CALCIUM 9.0 05/27/2022    ALBUMIN 4.0 07/12/2021    TOTPR 7.7 07/12/2021    ALKPHOS 142 (H) 07/12/2021    SGOTAST 77 (H) 07/12/2021    SGPTALT 37 07/12/2021    BILIT 0.5 07/12/2021     Pt need appt  last seen 09/27/20  Electronically signed by Teodoro Kil at 09/08/2022  8:53 AM EDT

## 2022-09-09 ENCOUNTER — Telehealth

## 2022-09-09 ENCOUNTER — Telehealth: Admit: 2022-09-09 | Discharge: 2022-09-09 | Payer: MEDICAID | Primary: Family

## 2022-09-09 DIAGNOSIS — Z1211 Encounter for screening for malignant neoplasm of colon: Secondary | ICD-10-CM

## 2022-09-09 MED ORDER — GOLYTELY 236 G PO SOLR
236 | Freq: Once | ORAL | 0 refills | Status: AC
Start: 2022-09-09 — End: 2022-09-09

## 2022-09-09 NOTE — Telephone Encounter (Signed)
Peg prep given to the patient via telephone, emailed, and mailed.  Procedure 09-16-2022, Dx Code: z12.11. Patient made aware to stop Trulicity injections 7 days prior to procedure, stop Iron medications 7 days prior to procedure, stop Xarelto 5 days prior to procedure, and hold diabetic medications/insulins morning of procedure. Patient verbalized understanding. Lmurrrell,LPN

## 2022-09-10 ENCOUNTER — Inpatient Hospital Stay: Admit: 2022-09-10 | Payer: MEDICAID | Primary: Family

## 2022-09-10 ENCOUNTER — Encounter

## 2022-09-10 DIAGNOSIS — R413 Other amnesia: Secondary | ICD-10-CM

## 2022-09-10 LAB — LABCORP SPECIMEN COLLECTION

## 2022-09-10 NOTE — Other (Signed)
Please let the patient know: This plain CT of the brain is negative with no evidence of acute or significant intracranial abnormalities

## 2022-09-10 NOTE — H&P (Signed)
Open access updated H&P.      Brief history: The patient is male White (non-Hispanic) 52 y.o. who was referred for screening colonoscopy.  Review of the records showed that they had few if any health problems, excessive obesity, or significant medications that would require office evaluation prior to colonoscopy for colon cancer screening.  After review of their chart, contact was made with the patient in discussion and literature sent regarding the procedure and the bowel preparation.  They are here now for their procedure.    @LASTCOLONOSCOPY @    @LASTCOLOGUARD @    @LASTFITTEST @          @LASTCOLONOSCOPY @    @LASTFLEXSIG @    @LASTFITTEST @     @LASTCOLOGUARD @    Past medical and surgical history:   Past Medical History:   Diagnosis Date    CAD (coronary artery disease)     Cancer ([place])     kidney    Chronic pain     DDD (degenerative disc disease), lumbar     Diabetes ([place])     Hypertension     Melanoma ([place])     [place]    OA (osteoarthritis) of knee     Renal lesion       Past Surgical History:   Procedure Laterality Date    BACK SURGERY  07/2019    GASTRIC BYPASS SURGERY      GASTRIC BYPASS SURGERY  2005    HERNIA REPAIR  2015    ORTHOPEDIC SURGERY  2013    fractured back    OTHER SURGICAL HISTORY Left 1989    degloved injury    OTHER SURGICAL HISTORY Bilateral 2012, 2014    Rotator cuff    SHOULDER ARTHROSCOPY      rotator cuff repair x 2        Allergies:    Allergies   Allergen Reactions    Heparin Angioedema, Shortness Of Breath, Anaphylaxis and Other (See Comments)        Medications:  No current facility-administered medications for this encounter.    Current Outpatient Medications:     acetaminophen-codeine (TYLENOL #3) 300-30 MG per tablet, Take 2 tablets by mouth every 8 hours as needed., Disp: , Rfl:     TRULICITY 3 0000000 SOPN, Inject 3 mg as directed Once a week at 5 PM, Disp: , Rfl:     fluticasone-salmeterol (ADVAIR DISKUS) 250-50 MCG/ACT AEPB diskus inhaler, Inhale 1 puff into the lungs 2 times  daily, Disp: , Rfl:     fenofibrate (TRICOR) 145 MG tablet, Take 1 tablet by mouth daily, Disp: , Rfl:     vitamin D (ERGOCALCIFEROL) 1.25 MG (50000 UT) CAPS capsule, Take 1 capsule by mouth once a week, Disp: , Rfl:     FEROSUL 325 (65 Fe) MG tablet, Take 1 tablet by mouth daily (with breakfast), Disp: , Rfl:     vitamin C (ASCORBIC ACID) 500 MG tablet, Take 1 tablet by mouth daily, Disp: , Rfl:     insulin lispro (HUMALOG) 100 UNIT/ML SOLN injection vial, Inject into the skin Patient has insulin pump, Disp: , Rfl:     omeprazole (PRILOSEC) 40 MG delayed release capsule, Take 1 capsule by mouth daily, Disp: , Rfl:     albuterol sulfate HFA (PROVENTIL;VENTOLIN;PROAIR) 108 (90 Base) MCG/ACT inhaler, TAKE 1-2 PUFFS INHALED BY MOUTH EVERY 6 HOURS., Disp: 18 g, Rfl: 1    tamsulosin (FLOMAX) 0.4 MG capsule, TAKE 1 CAPSULE BY MOUTH AFTER SUPPER, Disp: 90 capsule, Rfl: 0  empagliflozin (JARDIANCE) 10 MG tablet, Take 1 tablet by mouth every morning, Disp: , Rfl:     dicyclomine (BENTYL) 20 MG tablet, TAKE 1 TABLET BY MOUTH 4 TIMES DAILY AS NEEDED, Disp: , Rfl:     aspirin 81 MG chewable tablet, Take 1 tablet by mouth daily, Disp: , Rfl:     rivaroxaban (XARELTO) 20 MG TABS tablet, Take by mouth, Disp: , Rfl:     atenolol (TENORMIN) 25 MG tablet, Take 1 tablet by mouth daily, Disp: , Rfl:     atorvastatin (LIPITOR) 20 MG tablet, Take 1 tablet by mouth, Disp: , Rfl:     cyclobenzaprine (FLEXERIL) 10 MG tablet, Take 1 tablet by mouth every 8 hours as needed, Disp: , Rfl:     docusate (COLACE, DULCOLAX) 100 MG CAPS, Take 100 mg by mouth daily, Disp: , Rfl:     enalapril (VASOTEC) 20 MG tablet, Take 1 tablet by mouth daily, Disp: , Rfl:     hydroCHLOROthiazide (HYDRODIURIL) 25 MG tablet, Take 1 tablet by mouth daily, Disp: , Rfl:     nitroGLYCERIN (NITROSTAT) 0.4 MG SL tablet, Place 1 tablet under the tongue, Disp: , Rfl:      Family history:  The patient has a family history of no known GI disease.    Social history :     Social Connections: Not on file        ROS:   Review of Systems - negative     Vital signs:  There were no vitals taken for this visit.    PE: Healthy appearing male  HEENT: Normocephalic atraumatic.  No oral abnormalities.  Lungs: Clear to auscultation percussion.  Heart: Regular rate and rhythm without murmur rub or gallop.  Abdomen: Normal bowel sounds, soft nontender without hepatosplenomegaly or masses.  Extremities: No peripheral edema.  Neuro: No distinct abnormalities.    Impression:  1.  Colon cancer screening.  The patient is a healthy male that is stable and here for colon cancer screening via colonoscopy.      Recommendations:  1.  Colonoscopy with MAC.  The risks, benefits, adverse reactions including death and the possibility of missed lesions were neoplasia were discussed in detail today with the patient prior to the procedure.  They understand and agreed to undergo the procedure.  2.  Further recommendations pending their clinical course.  3.  Followup as needed.

## 2022-09-11 LAB — COMPREHENSIVE METABOLIC PANEL
ALT: 12 IU/L (ref 0–44)
AST: 27 IU/L (ref 0–40)
Albumin/Globulin Ratio: 1.8 (ref 1.2–2.2)
Albumin: 4.6 g/dL (ref 3.8–4.9)
Alkaline Phosphatase: 97 IU/L (ref 44–121)
BUN/Creatinine Ratio: 22 — ABNORMAL HIGH (ref 9–20)
BUN: 19 mg/dL (ref 6–24)
CO2: 25 mmol/L (ref 20–29)
Calcium: 9.5 mg/dL (ref 8.7–10.2)
Chloride: 100 mmol/L (ref 96–106)
Creatinine: 0.88 mg/dL (ref 0.76–1.27)
Est, Glom Filt Rate: 102 mL/min/{1.73_m2} (ref >59)
Globulin, Total: 2.6 g/dL (ref 1.5–4.5)
Glucose: 107 mg/dL — ABNORMAL HIGH (ref 70–99)
Potassium: 4.4 mmol/L (ref 3.5–5.2)
Sodium: 143 mmol/L (ref 134–144)
Total Bilirubin: 0.5 mg/dL (ref 0.0–1.2)
Total Protein: 7.2 g/dL (ref 6.0–8.5)

## 2022-09-11 LAB — CBC
Hematocrit: 43.2 % (ref 37.5–51.0)
Hemoglobin: 14.5 g/dL (ref 13.0–17.7)
MCH: 27.9 pg (ref 26.6–33.0)
MCHC: 33.6 g/dL (ref 31.5–35.7)
MCV: 83 fL (ref 79–97)
Platelets: 190 10*3/uL (ref 150–450)
RBC: 5.19 x10E6/uL (ref 4.14–5.80)
RDW: 16.3 % — ABNORMAL HIGH (ref 11.6–15.4)
WBC: 7.2 10*3/uL (ref 3.4–10.8)

## 2022-09-11 LAB — SPECIMEN STATUS REPORT

## 2022-09-11 LAB — LIPID PANEL
Cholesterol: 131 mg/dL (ref 100–199)
HDL: 46 mg/dL (ref >39)
LDL Calculated: 58 mg/dL (ref 0–99)
Triglycerides: 156 mg/dL — ABNORMAL HIGH (ref 0–149)
VLDL Cholesterol Calculated: 27 mg/dL (ref 5–40)

## 2022-09-11 NOTE — Other (Signed)
Please call this patient and find out why he had blood work done 3 weeks after we just did blood work?  I did order this patient blood work to be done in the future before his next appointment in September but not to be done immediately now?  Please clarify and help me to understand what occurred

## 2022-09-11 NOTE — Other (Signed)
Please let the patient know that the blood work that he got should have not been done until his September office visit I not sure why it was done as my orders did sedate that these should be released in September even if he asked the lab should have question I will be addressing this with the lab

## 2022-09-16 ENCOUNTER — Inpatient Hospital Stay: Payer: MEDICARE | Attending: Gastroenterology

## 2022-09-16 LAB — POCT GLUCOSE: POC Glucose: 118 mg/dL — ABNORMAL HIGH (ref 70–110)

## 2022-09-16 MED ORDER — PROPOFOL 200 MG/20ML IV EMUL
200 | INTRAVENOUS | Status: AC
Start: 2022-09-16 — End: ?

## 2022-09-16 MED ORDER — LACTATED RINGERS IV SOLN
INTRAVENOUS | Status: DC
Start: 2022-09-16 — End: 2022-09-16
  Administered 2022-09-16: 18:00:00 via INTRAVENOUS

## 2022-09-16 MED ORDER — PROPOFOL 200 MG/20ML IV EMUL
200 | INTRAVENOUS | Status: DC | PRN
Start: 2022-09-16 — End: 2022-09-16
  Administered 2022-09-16: 18:00:00 200 via INTRAVENOUS
  Administered 2022-09-16: 19:00:00 100 via INTRAVENOUS
  Administered 2022-09-16: 19:00:00 200 via INTRAVENOUS

## 2022-09-16 MED ORDER — NORMAL SALINE FLUSH 0.9 % IV SOLN
0.9 | Freq: Two times a day (BID) | INTRAVENOUS | Status: DC
Start: 2022-09-16 — End: 2022-09-16

## 2022-09-16 MED FILL — DIPRIVAN 200 MG/20ML IV EMUL: 200 MG/20ML | INTRAVENOUS | Qty: 20

## 2022-09-16 MED FILL — LACTATED RINGERS IV SOLN: INTRAVENOUS | Qty: 1000

## 2022-09-16 MED FILL — SODIUM CHLORIDE FLUSH 0.9 % IV SOLN: 0.9 % | INTRAVENOUS | Qty: 40

## 2022-09-16 NOTE — Discharge Instructions (Addendum)
EGD:  Recommendations:    1.  Continue present therapy.    2.  Follow up as needed.  3.  Further recommendations pending clinical course as needed.  4.  Proceed with colonoscopy.     Colonoscopy:  Recommendations:  Check pathology.  Will notify patient with results when they are available.  Repeat colonoscopy in 5-10 years for surveillance versus screening.  Follow up as needed.

## 2022-09-16 NOTE — Anesthesia Post-Procedure Evaluation (Signed)
Department of Anesthesiology  Postprocedure Note    Patient: Jordan Weiss  MRN: [id number]  Birthdate: [birth year]  Date of evaluation: 09/16/2022    Procedure Summary       Date: 09/16/22 Room / Location: [facility] / [facility]    Anesthesia Start: 1420 Anesthesia Stop: [id number]    Procedures:       COLONOSCOPY (Lower GI Region)      ESOPHAGOGASTRODUODENOSCOPY (Upper GI Region) Diagnosis:       Special screening for malignant neoplasms, colon      (Special screening for malignant neoplasms, colon [Z12.11])    Surgeons: Benard Halsted, MD Responsible Provider: Caro Laroche, APRN - CRNA    Anesthesia Type: MAC ASA Status: 3            Anesthesia Type: MAC    Aldrete Phase I: Aldrete Score: 10    Aldrete Phase II:      Anesthesia Post Evaluation    Patient location during evaluation: PACU  Patient participation: complete - patient participated  Level of consciousness: awake  Pain score: 0  Airway patency: patent  Nausea & Vomiting: no nausea  Cardiovascular status: blood pressure returned to baseline  Respiratory status: acceptable  Hydration status: euvolemic  Pain management: adequate    No notable events documented.

## 2022-09-16 NOTE — Anesthesia Pre-Procedure Evaluation (Signed)
Department of Anesthesiology  Preprocedure Note       Name:  Jordan Weiss   Age:  52 y.o.  DOB:  12-03-1970                                          MRN:  [id number]         Date:  09/16/2022      Surgeon: Juliann Mule):  Benard Halsted, MD    Procedure: Procedure(s):  COLONOSCOPY  ESOPHAGOGASTRODUODENOSCOPY    Medications prior to admission:   Prior to Admission medications    Medication Sig Start Date End Date Taking? Authorizing Provider   acetaminophen-codeine (TYLENOL #3) 300-30 MG per tablet Take 2 tablets by mouth every 8 hours as needed.    [provider]   TRULICITY 3 0000000 SOPN Inject 3 mg as directed Once a week at 5 PM 08/11/22   [provider]   fluticasone-salmeterol (ADVAIR DISKUS) 250-50 MCG/ACT AEPB diskus inhaler Inhale 1 puff into the lungs 2 times daily    [provider]   fenofibrate (TRICOR) 145 MG tablet Take 1 tablet by mouth daily 08/12/22   [provider]   vitamin D (ERGOCALCIFEROL) 1.25 MG (50000 UT) CAPS capsule Take 1 capsule by mouth once a week 08/13/22   [provider]   FEROSUL 325 (65 Fe) MG tablet Take 1 tablet by mouth daily (with breakfast) 08/13/22   [provider]   vitamin C (ASCORBIC ACID) 500 MG tablet Take 1 tablet by mouth daily 08/13/22   [provider]   insulin lispro (HUMALOG) 100 UNIT/ML SOLN injection vial Inject into the skin Patient has insulin pump    [provider]   omeprazole (PRILOSEC) 40 MG delayed release capsule Take 1 capsule by mouth daily 08/13/22   [provider]   albuterol sulfate HFA (PROVENTIL;VENTOLIN;PROAIR) 108 (90 Base) MCG/ACT inhaler TAKE 1-2 PUFFS INHALED BY MOUTH EVERY 6 HOURS. 08/19/22   Princella Pellegrini, FNP   tamsulosin (FLOMAX) 0.4 MG capsule TAKE 1 CAPSULE BY MOUTH AFTER SUPPER 08/19/22   Princella Pellegrini, FNP   empagliflozin (JARDIANCE) 10 MG tablet Take 1 tablet by mouth every morning    [provider]   dicyclomine (BENTYL) 20 MG tablet  TAKE 1 TABLET BY MOUTH 4 TIMES DAILY AS NEEDED 02/06/22   [provider]   aspirin 81 MG chewable tablet Take 1 tablet by mouth daily 03/10/22   [provider]   rivaroxaban (XARELTO) 20 MG TABS tablet Take by mouth 07/22/21   [provider]   atenolol (TENORMIN) 25 MG tablet Take 1 tablet by mouth daily 05/12/16   Automatic Reconciliation, Ar   atorvastatin (LIPITOR) 20 MG tablet Take 1 tablet by mouth 11/08/19   Automatic Reconciliation, Ar   cyclobenzaprine (FLEXERIL) 10 MG tablet Take 1 tablet by mouth every 8 hours as needed    Automatic Reconciliation, Ar   docusate (COLACE, DULCOLAX) 100 MG CAPS Take 100 mg by mouth daily 11/29/19   Automatic Reconciliation, Ar   enalapril (VASOTEC) 20 MG tablet Take 1 tablet by mouth daily 12/31/20   Automatic Reconciliation, Ar   hydroCHLOROthiazide (HYDRODIURIL) 25 MG tablet Take 1 tablet by mouth daily 05/12/16   Automatic Reconciliation, Ar   nitroGLYCERIN (NITROSTAT) 0.4 MG SL tablet Place 1 tablet under the tongue 05/12/16   Automatic Reconciliation, Ar  Current medications:    Current Facility-Administered Medications   Medication Dose Route Frequency Provider Last Rate Last Admin   . sodium chloride flush 0.9 % injection 5-40 mL  5-40 mL IntraVENous 2 times per day Charna Busman, APRN - CRNA       . lactated ringers IV soln infusion   IntraVENous Continuous Benard Halsted, MD 100 mL/hr at 09/16/22 1332 New Bag at 09/16/22 1332       Allergies:    Allergies   Allergen Reactions   . Heparin Angioedema, Shortness Of Breath, Anaphylaxis and Other (See Comments)       Problem List:    Patient Active Problem List   Diagnosis Code   . Lumbosacral spondylosis without myelopathy M47.817   . Controlled type 2 diabetes mellitus without complication, without long-term current use of insulin (HCC) E11.9   . Class 3 severe obesity due to excess calories without serious comorbidity in adult (HCC) E66.01   . WPW (Wolff-Parkinson-White syndrome)  I45.6   . DDD (degenerative disc disease), lumbar M51.36   . Atrial fibrillation ([place]) I48.91   . Renal mass N28.89   . Coronary artery disease involving native heart without angina pectoris I25.10   . Superficial ulcerative lesion ([place]) L98.499   . Renal carcinoma, left (HCC) C64.2   . BPH (benign prostatic hyperplasia) N40.0   . Schizoaffective disorder ([place]) F25.9   . Essential hypertension I10       Past Medical History:        Diagnosis Date   . CAD (coronary artery disease)    . Cancer ([place])     kidney   . Chronic pain    . DDD (degenerative disc disease), lumbar    . Diabetes ([place])    . Hypertension    . Melanoma ([place])     [place]   . OA (osteoarthritis) of knee    . Renal lesion        Past Surgical History:        Procedure Laterality Date   . BACK SURGERY  07/2019   . GASTRIC BYPASS SURGERY     . GASTRIC BYPASS SURGERY  2005   . HERNIA REPAIR  2015   . ORTHOPEDIC SURGERY  2013    fractured back   . OTHER SURGICAL HISTORY Left 1989    degloved injury   . OTHER SURGICAL HISTORY Bilateral 2012, 2014    Rotator cuff   . SHOULDER ARTHROSCOPY      rotator cuff repair x 2       Social History:    Social History     Tobacco Use   . Smoking status: Former   . Smokeless tobacco: Never   Substance Use Topics   . Alcohol use: Not Currently     Alcohol/week: 0.0 standard drinks of alcohol                                Counseling given: Not Answered      Vital Signs (Current):   Vitals:    09/16/22 1317 09/16/22 1320   BP: (!) 142/82    Pulse: 87    Resp: 18    Temp: 36.6 C (97.9 F)    TempSrc: Temporal    SpO2: 97%    Weight:  (!) 140.6 kg (310 lb)  BP Readings from Last 3 Encounters:   09/16/22 (!) 142/82   09/02/22 108/65   08/19/22 110/70       NPO Status: Time of last liquid consumption: 1200 (black coffee 4oz)                        Time of last solid consumption: 2330                        Date of last liquid consumption: 09/16/22                        Date of last  solid food consumption: 09/14/22    BMI:   Wt Readings from Last 3 Encounters:   09/16/22 (!) 140.6 kg (310 lb)   09/02/22 (!) 140.6 kg (310 lb)   08/19/22 (!) 140.6 kg (310 lb)     Body mass index is 38.75 kg/m.    CBC:   Lab Results   Component Value Date/Time    WBC 7.2 09/10/2022 12:00 AM    RBC 5.19 09/10/2022 12:00 AM    HGB 14.5 09/10/2022 12:00 AM    HCT 43.2 09/10/2022 12:00 AM    MCV 83 09/10/2022 12:00 AM    RDW 16.3 09/10/2022 12:00 AM    PLT 190 09/10/2022 12:00 AM       CMP:   Lab Results   Component Value Date/Time    NA 143 09/10/2022 12:00 AM    K 4.4 09/10/2022 12:00 AM    CL 100 09/10/2022 12:00 AM    CO2 25 09/10/2022 12:00 AM    BUN 19 09/10/2022 12:00 AM    CREATININE 0.88 09/10/2022 12:00 AM    GFRAA 126 12/28/2019 05:07 PM    AGRATIO 1.8 09/10/2022 12:00 AM    LABGLOM 102 09/10/2022 12:00 AM    GLUCOSE 107 09/10/2022 12:00 AM    PROT 7.2 09/10/2022 12:00 AM    CALCIUM 9.5 09/10/2022 12:00 AM    BILITOT 0.5 09/10/2022 12:00 AM    ALKPHOS 97 09/10/2022 12:00 AM    AST 27 09/10/2022 12:00 AM    ALT 12 09/10/2022 12:00 AM       POC Tests:   Recent Labs     09/16/22  1327   POCGLU 118*       Coags: No results found for: "PROTIME", "INR", "APTT"    HCG (If Applicable): No results found for: "PREGTESTUR", "PREGSERUM", "HCG", "HCGQUANT"     ABGs: No results found for: "PHART", "PO2ART", "PCO2ART", "HCO3ART", "BEART", "O2SATART"     Type & Screen (If Applicable):  No results found for: "LABABO", "LABRH"    Drug/Infectious Status (If Applicable):  No results found for: "HIV", "HEPCAB"    COVID-19 Screening (If Applicable): No results found for: "COVID19"        Anesthesia Evaluation  Patient summary reviewed and Nursing notes reviewed  Airway: Mallampati: III  TM distance: >3 FB     Mouth opening: > = 3 FB   Dental:    (+) poor dentition      Pulmonary:Negative Pulmonary ROS and normal exam                               Cardiovascular:  Exercise tolerance: no interval change  (+) hypertension:, CAD:  no interval change, dysrhythmias: atrial fibrillation  ECG reviewed  Rhythm: irregular  Rate: normal                 ROS comment: WPW, Afib     Neuro/Psych:   Negative Neuro/Psych ROS              GI/Hepatic/Renal:   (+) bowel prep, morbid obesity          Endo/Other:    (+) Diabeteswell controlled, malignancy/cancer.                 Abdominal:             Vascular: negative vascular ROS.         Other Findings:       Anesthesia Plan      MAC     ASA 3       Induction: intravenous.      Anesthetic plan and risks discussed with patient.    Use of blood products discussed with whom consented to blood products.                  Joen Laura, APRN - CRNA   09/16/2022

## 2022-09-16 NOTE — Interval H&P Note (Deleted)
Update History & Physical    The patient's History and Physical of September 16, 2022 was reviewed with the patient and I examined the patient. There was no change. The surgical site was confirmed by the patient and me.     Plan: The risks, benefits, expected outcome, and alternative to the recommended procedure have been discussed with the patient. Patient understands and wants to proceed with the procedure.     Electronically signed by Benard Halsted, MD on 09/16/2022 at 1:12 PM

## 2022-09-16 NOTE — Interval H&P Note (Signed)
Update History & Physical    The patient's History and Physical of September 16, 2022 was reviewed with the patient and I examined the patient. There was no change. The surgical site was confirmed by the patient and me.     Plan: The risks, benefits, expected outcome, and alternative to the recommended procedure have been discussed with the patient. Patient understands and wants to proceed with the procedure.     Electronically signed by Benard Halsted, MD on 09/16/2022 at 2:18 PM        Addendum: The patient was referred strictly for screening colonoscopy for colon cancer screening.  However reports today that he has had problems with epigastric pain and burning and upset stomach for 3 years.  This is also noted in the PCP note.  He would like to have an EGD to evaluate for any upper GI possibilities causing his discomfort.  The risks, benefits, adverse reactions including perforation bleeding and death were discussed with the patient today.  Risk of anesthesia was also discussed.  He is agreeable to undergo both EGD and colonoscopy today today to evaluate for epigastric pain and colon cancer screening respectively.  I did tell the patient and his wife if this does not reveal any pathology then his symptoms are likely non-GI related.  In particular could be due to his diabetes, diabetes medication such as Trulicity, and adhesions from gastric bypass and abdominal hernia repairs.

## 2022-09-16 NOTE — Op Note (Signed)
EGD and Colonoscopy procedure notes    Date of procedure: @TD @    Indication for procedure: Epigastric pain    Type of procedure: Upper endoscopy     Anesthesia classification: ASA class 3    Patient history and physical has been accomplished and documented.    Patient is assessed and determined to be an appropriate candidate for planned procedure and sedation.  The patient was assessed immediately prior to sedation.    Sedation plan: MAC per anesthesia.    Surgical assistant: Not applicable    Airway assessment: Range of motion: normal, mouth opening: Normal, visual obstruction: No.    PREOP EXAM:  Unchanged.    VS: Reviewed  Gen: WDWN in NAD  CV: RRR, no murmur  Resp: CTAB  Abd: Soft, NTND, +BS  Extrem: No cyanosis or edema  Neuro: Awake and alert      Informed consent obtained: Yes.  The indications, risks, including but not limited to bleeding, perforation, infection, death, potential for failure to visualize or diagnose neoplasia, alternatives, benefits, discussed in detail with the patient prior to the procedure.  Patient understands and agrees to the procedure.  Patient identity and procedure were verified, consent was obtained, and is consistent with the consent form in the patient's records.    INSTRUMENT: Olympus upper endoscope    PROCEDURE FINDINGS:    OROPHARYNX: Normal.  The oropharynx had no evidence of erythema or edema.    ESOPHAGUS:  Esophageal mucosa normal with no masses noted in the proximal, mid, and distal esophagus.   No endoscopic features of eosinophilic esophagitis were noted.  The Z-line was at 45 cm. and was normal.    No hiatal hernia was noted distally.        STOMACH: Stomach was then evaluated including the cardia and fundus on retroflexion view.  Patient is status post gastric bypass.  Evaluation of the gastric body, antrum, and pylorus were normal, including normal gastric mucosa with no masses, ulcers, or mucosal abnormalities.  The Efferent limb was normal, the gastric pouch was  normal and the anastomosis was normal.  Retroflexion view of the cardia and fundus were normal.     DUODENUM:  The duodenal bulb and descending duodenum were then evaluated and found to be normal including no masses, ulcers, or or mucosal abnormalities.      SPECIMENS: None    ESTIMATED BLOOD LOSS:  None.    COMPLICATIONS:  None     COMMENTS: none    Impression:  1.  Normal upper endoscopy with normal gastric bypass.  2.  No upper GI etiology found to explain the patient's epigastric pain.  I feel this is non-GI in nature and due to his obesity, and prior gastric bypass and abdominal hernia surgeries with mesh making adhesions a possibility.  Some of his diabetes medication in particular Trulicity could also be causing his symptoms.      Recommendations:    1.  Continue present therapy.    2.  Follow up as needed.  3.  Further recommendations pending clinical course as needed.  4.  Proceed with colonoscopy.    Gibson Ramp, M.D.        Colonoscopy procedure note    Date of service: 09/16/2022    Type:  Screening    Indication for procedure: Colon cancer screening    Anesthesia classification: ASA class 3    Patient history and physical been accomplished and documented.  Patient is assessed and determined to be appropriate candidate  for planned procedure and sedation; patient reassessed immediately prior to sedation.      Sedation plan: MAC per anesthesia    Surgical assistant: Not applicable    Airway assessment: Range of motion: Normal, mouth opening, Visual obstruction: No.    UPDATED PREOP EXAM:  Unchanged.    VS: Reviewed  Gen: in NAD  CV: RRR, no murmur  Resp: CTA  Abd: Soft, NTND, +BS  Extrem: No cyanosis or edema  Neuro: Awake and alert    Informed consent obtained: Yes.  The indications, risks including but not limited to bleeding, perforation, infection, death, and potential failure to visual areas are diagnosed neoplasia, alternatives and benefits were discussed with the patient prior to the procedure.   Patient identity and procedure was verified, absent was obtained, and is consistent with the consent form found in the patient's records.    PROCEDURE PERFORMED:  COLONOSCOPY  to the cecum with MAC and forcep polypectomy of 2 diminutive polyps as below.    INSTRUMENT: Olympus colonoscope per nursing notes.    FINDINGS:    External anal lesions: Normal   Rectum: normal.  Rectal sphincter tone was normal.   Retroflexion view: Grade 2 internal hemorrhoids.  Sigmoid: normal except for mild diverticulosis.  There is also a diminutive polyp removed by forcep polypectomy without incident.  Descending Colon: normal   Transverse Colon: normal except for diminutive polyp removed by forcep polypectomy without incident.  Ascending Colon: normal   Cecum: normal, including the appendiceal orifice and ileocecal valve.    Terminal ileum: not evaluated     Specimens: none     Bowel preparation- adequate to detect small (43mm) polyps or larger.    Estimated blood loss: none   Complications:  none   Cecal withdrawal time: 10 minutes.    Comments:   While colonoscopy is the best test to prevent and detect cancer, it is not perfect. Polyps and sometimes cancers can be missed during a colonoscopy for a variety of reasons. If the patient develops symptoms in the future that could reflect the possibility of polyps or cancer, this limitation of colonoscopy should be kept in mind.     Impression:  Removal 2 diminutive polyps as above.  Mild sigmoid diverticulosis.  Grade 2 internal hemorrhoids.  No lower GI etiology found to explain the patient's symptoms.      Recommendations:  Check pathology.  Will notify patient with results when they are available.  Repeat colonoscopy in 5-10 years for surveillance versus screening.  Follow up as needed.

## 2022-10-13 ENCOUNTER — Encounter

## 2022-10-13 MED ORDER — OMEPRAZOLE 40 MG PO CPDR
40 | ORAL_CAPSULE | Freq: Every day | ORAL | 0 refills | Status: DC
Start: 2022-10-13 — End: 2022-12-25

## 2022-10-13 NOTE — Telephone Encounter (Signed)
Omeprazole req refill

## 2022-11-26 ENCOUNTER — Inpatient Hospital Stay
Admit: 2022-11-26 | Discharge: 2022-11-26 | Disposition: A | Discharge status: Home / Self Care | Payer: MEDICARE | Attending: Emergency Medicine

## 2022-11-26 DIAGNOSIS — M545 Low back pain, unspecified: Secondary | ICD-10-CM

## 2022-11-26 MED ORDER — LIDOCAINE 5 % EX PTCH
5 | MEDICATED_PATCH | Freq: Every day | CUTANEOUS | 0 refills | Status: AC
Start: 2022-11-26 — End: 2022-12-06

## 2022-11-26 NOTE — Discharge Instructions (Signed)
Return for pain, fever not resolving with motrin or tylenol, shortness of breath, vomiting, decreased fluid intake, any weakness or numbness, any urinary or bowel changes, dizziness, or any change or concerns or imaging as discussed/offerred now.

## 2022-11-26 NOTE — ED Notes (Signed)
I have reviewed discharge instructions with the patient.  The patient verbalized understanding. Patient encouraged to return to ER with any other concerns or emergent care needed. Patient escorted to waiting room with steady gait and no distress noted.

## 2022-11-26 NOTE — ED Provider Notes (Incomplete)
[facility] EMERGENCY DEPT  EMERGENCY DEPARTMENT ENCOUNTER      Pt Name: Jordan Weiss  MRN: [id number]  Birthdate 1970-07-01  Date of evaluation: 11/26/2022  Provider: Erin Fulling, MD    CHIEF COMPLAINT       Chief Complaint   Patient presents with    Fall       HPI:  Jordan Weiss is a 52 y.o. male who presents to the emergency department pt c/o low back pain b/l.  After twisted when almost fell in br pta.  No fall, no direct injury. No weakness or numbness. No radiation.  Min pain at rest, hurts to bend.  No rash. No urinary or bowel changes  no swelling. No other pain. Has h/o chronic same pain, low back b/l, on flexeril and tyl and codeine. Req work note and lidocaine patch.  Denies fever.    HPI    Nursing Notes were reviewed.    REVIEW OF SYSTEMS    (2-9 systems for level 4, 10 or more for level 5)     Review of Systems    Except as noted above the remainder of the review of systems was reviewed and negative.       PAST MEDICAL HISTORY     Past Medical History:   Diagnosis Date    CAD (coronary artery disease)     Cancer (HCC)     kidney    Chronic pain     DDD (degenerative disc disease), lumbar     Diabetes (HCC)     Hypertension     Melanoma (HCC)     [place]    OA (osteoarthritis) of knee     Renal lesion          SURGICAL HISTORY       Past Surgical History:   Procedure Laterality Date    BACK SURGERY  07/2019    COLONOSCOPY N/A 09/16/2022    COLONOSCOPY with polypectomy performed by Marvis Repress, MD at [facility] ENDOSCOPY    GASTRIC BYPASS SURGERY      GASTRIC BYPASS SURGERY  2005    HERNIA REPAIR  2015    ORTHOPEDIC SURGERY  2013    fractured back    OTHER SURGICAL HISTORY Left 1989    degloved injury    OTHER SURGICAL HISTORY Bilateral 2012, 2014    Rotator cuff    SHOULDER ARTHROSCOPY      rotator cuff repair x 2    UPPER GASTROINTESTINAL ENDOSCOPY N/A 09/16/2022    ESOPHAGOGASTRODUODENOSCOPY performed by Marvis Repress, MD at [facility] ENDOSCOPY         CURRENT MEDICATIONS       Previous Medications     ACETAMINOPHEN-CODEINE (TYLENOL #3) 300-30 MG PER TABLET    Take 2 tablets by mouth every 8 hours as needed.    ALBUTEROL SULFATE HFA (PROVENTIL;VENTOLIN;PROAIR) 108 (90 BASE) MCG/ACT INHALER    TAKE 1-2 PUFFS INHALED BY MOUTH EVERY 6 HOURS.    ASPIRIN 81 MG CHEWABLE TABLET    Take 1 tablet by mouth daily    ATENOLOL (TENORMIN) 25 MG TABLET    Take 1 tablet by mouth daily    ATORVASTATIN (LIPITOR) 20 MG TABLET    Take 1 tablet by mouth    CYCLOBENZAPRINE (FLEXERIL) 10 MG TABLET    Take 1 tablet by mouth every 8 hours as needed    DICYCLOMINE (BENTYL) 20 MG TABLET    TAKE 1 TABLET BY MOUTH 4 TIMES DAILY AS NEEDED  DOCUSATE (COLACE, DULCOLAX) 100 MG CAPS    Take 100 mg by mouth daily    EMPAGLIFLOZIN (JARDIANCE) 10 MG TABLET    Take 1 tablet by mouth every morning    ENALAPRIL (VASOTEC) 20 MG TABLET    Take 1 tablet by mouth daily    FENOFIBRATE (TRICOR) 145 MG TABLET    Take 1 tablet by mouth daily    FEROSUL 325 (65 FE) MG TABLET    Take 1 tablet by mouth daily (with breakfast)    FLUTICASONE-SALMETEROL (ADVAIR DISKUS) 250-50 MCG/ACT AEPB DISKUS INHALER    Inhale 1 puff into the lungs 2 times daily    HYDROCHLOROTHIAZIDE (HYDRODIURIL) 25 MG TABLET    Take 1 tablet by mouth daily    INSULIN ASPART, W/NIACINAMIDE, (FIASP) 100 UNIT/ML SOLN    DELIVER UP TO 100 UNITS DAILY VIA INSULIN PUMP E11.65    NITROGLYCERIN (NITROSTAT) 0.4 MG SL TABLET    Place 1 tablet under the tongue    OMEPRAZOLE (PRILOSEC) 40 MG DELAYED RELEASE CAPSULE    Take 1 capsule by mouth daily    RIVAROXABAN (XARELTO) 20 MG TABS TABLET    Take by mouth    TAMSULOSIN (FLOMAX) 0.4 MG CAPSULE    TAKE 1 CAPSULE BY MOUTH AFTER SUPPER    TRULICITY 3 MG/0.5ML SOPN    Inject 3 mg as directed Once a week at 5 PM    VITAMIN C (ASCORBIC ACID) 500 MG TABLET    Take 1 tablet by mouth daily    VITAMIN D (ERGOCALCIFEROL) 1.25 MG (50000 UT) CAPS CAPSULE    Take 1 capsule by mouth once a week       ALLERGIES     Heparin    FAMILY HISTORY       Family History    Problem Relation Age of Onset    Diabetes Mother     Cancer Mother         breast    Hypertension Brother     Heart Attack Brother     Heart Attack Brother     Hypertension Brother     Stroke Brother     Diabetes Brother     Hypertension Sister     Diabetes Brother     Hypertension Brother     Kidney Disease Brother         had transplant    Hypertension Sister     Heart Failure Sister     Cancer Sister         cervical cancer    Osteoporosis Sister     Hypertension Sister     Diabetes Sister     Stroke Father     Diabetes Father     Heart Disease Mother     Cancer Father         lung          SOCIAL HISTORY       Social History     Socioeconomic History    Marital status: Married     Spouse name: None    Number of children: None    Years of education: None    Highest education level: None   Tobacco Use    Smoking status: Former    Smokeless tobacco: Never   Haematologist Use: Former   Substance and Sexual Activity    Alcohol use: Not Currently     Alcohol/week: 0.0 standard drinks of alcohol    Drug use:  No     Social Determinants of Health     Financial Resource Strain: Low Risk  (08/19/2022)    Overall Financial Resource Strain (CARDIA)     Difficulty of Paying Living Expenses: Not hard at all   Food Insecurity: No Food Insecurity (08/19/2022)    Hunger Vital Sign     Worried About Running Out of Food in the Last Year: Never true     Ran Out of Food in the Last Year: Never true   Transportation Needs: Unknown (08/19/2022)    PRAPARE - Transportation     Lack of Transportation (Non-Medical): No   Housing Stability: Unknown (08/19/2022)    Housing Stability Vital Sign     Unstable Housing in the Last Year: No       SCREENINGS         Glasgow Coma Scale  Eye Opening: Spontaneous  Best Verbal Response: Oriented  Best Motor Response: Obeys commands  Glasgow Coma Scale Score: 15                     CIWA Assessment  BP: 116/80  Pulse: 68                 PHYSICAL EXAM    (up to 7 for level 4, 8 or more for level 5)      ED Triage Vitals [11/26/22 1844]   BP Temp Temp Source Pulse Respirations SpO2 Height Weight - Scale   116/80 98.2 F (36.8 C) Oral 68 18 99 % 1.905 m (6\' 3" ) 131.5 kg (290 lb)       Physical Exam  Vitals and nursing note reviewed.   Constitutional:       Appearance: Normal appearance.   HENT:      Head: Normocephalic and atraumatic.   Eyes:      Conjunctiva/sclera: Conjunctivae normal.   Cardiovascular:      Rate and Rhythm: Normal rate.      Pulses: Normal pulses.   Pulmonary:      Effort: Pulmonary effort is normal. No respiratory distress.   Abdominal:      General: Abdomen is flat.      Palpations: Abdomen is soft.      Tenderness: There is no abdominal tenderness. There is no guarding.      Hernia: No hernia is present.   Musculoskeletal:         General: Tenderness (b/l inf lumbar spasm/mild ttp. from. nvi. no rash/swelling) present. Normal range of motion.      Cervical back: Normal range of motion.   Skin:     General: Skin is warm and dry.      Coloration: Skin is not pale.      Findings: No rash.   Neurological:      General: No focal deficit present.      Mental Status: He is alert.      Sensory: No sensory deficit.      Motor: No weakness.   Psychiatric:         Mood and Affect: Mood normal.         DIAGNOSTIC RESULTS     EKG: All EKG's are interpreted by the Emergency Department Physician who either signs or Co-signs this chart in the absence of a cardiologist.      RADIOLOGY:   Non-plain film images such as CT, Ultrasound and MRI are read by the radiologist. Plain radiographic images are visualized and preliminarily interpreted by the emergency  physician with the below findings:      Interpretation per the Radiologist below, if available at the time of this note:    No orders to display         LABS:  Labs Reviewed - No data to display    All other labs were within normal range or not returned as of this dictation.    EMERGENCY DEPARTMENT COURSE and DIFFERENTIAL DIAGNOSIS/MDM:   Vitals:     Vitals:    11/26/22 1844   BP: 116/80   Pulse: 68   Resp: 18   Temp: 98.2 F (36.8 C)   TempSrc: Oral   SpO2: 99%   Weight: 131.5 kg (290 lb)   Height: 1.905 m (6\' 3" )         Discussion:       No emc. Pt on xarelto, declines ct or x-ray as rec/offered/d w pt at detail.  Not c/w cord compression/cauda equina/fracture/epidural abscess/acute abdomen/dvt/aaa. Stable for discharge and close follow-up.  Patient agrees w discharge plan and verbalizes understanding of detailed return inst given. No indication for urgent mri or other imaging.           FINAL IMPRESSION      1. Bilateral low back pain without sciatica, unspecified chronicity          DISPOSITION/PLAN   DISPOSITION Decision To Discharge 11/26/2022 07:16:20 PM      PATIENT REFERRED TO:  [facility] EMERGENCY DEPT  [address]  [place] [address]  [phone]  Go to   As needed, If symptoms worsen    Drake Leach, FNP  [address]  [place] [address]  [phone]            DISCHARGE MEDICATIONS:  New Prescriptions    LIDOCAINE (LIDODERM) 5 %    Place 1 patch onto the skin daily for 10 days 12 hours on, 12 hours off.     Controlled Substances Monitoring:          No data to display                (Please note that portions of this note were completed with a voice recognition program.  Efforts were made to edit the dictations but occasionally words are mis-transcribed.)    Erin Fulling, MD (electronically signed)  Attending Emergency Physician

## 2022-11-26 NOTE — ED Triage Notes (Signed)
Pt states he slipped getting out of the shower this afternoon and is still having backpain   pt states he is in pain management and has taken his regular meds, but pain is steady   pt states he is not in need of medication, but thought he may need a brace or something

## 2022-12-03 ENCOUNTER — Ambulatory Visit: Payer: MEDICARE | Attending: Internal Medicine | Primary: Family

## 2022-12-25 ENCOUNTER — Encounter

## 2022-12-25 MED ORDER — OMEPRAZOLE 40 MG PO CPDR
40 MG | ORAL_CAPSULE | Freq: Every day | ORAL | 0 refills | Status: DC
Start: 2022-12-25 — End: 2023-03-30

## 2022-12-25 NOTE — Telephone Encounter (Signed)
Per NP Kerrick okay to refill.

## 2022-12-29 ENCOUNTER — Encounter

## 2022-12-29 MED ORDER — TAMSULOSIN HCL 0.4 MG PO CAPS
0.4 MG | ORAL_CAPSULE | ORAL | 0 refills | Status: DC
Start: 2022-12-29 — End: 2023-04-10

## 2022-12-29 NOTE — Telephone Encounter (Signed)
Per NP Kerrick okay to refill.

## 2023-01-06 ENCOUNTER — Encounter

## 2023-01-13 NOTE — Progress Notes (Signed)
Chart audit performed on A1C topic. No results found in chart.

## 2023-02-16 ENCOUNTER — Encounter

## 2023-03-05 ENCOUNTER — Encounter: Payer: MEDICARE | Attending: Family | Primary: Family

## 2023-03-10 NOTE — Progress Notes (Signed)
 Patient's HM shows they are overdue for Hemoglobin A1C  Care Everywhere and Media Manager files searched.  HM updated with 07/13/21 A1C.

## 2023-03-18 ENCOUNTER — Ambulatory Visit: Admit: 2023-03-18 | Discharge: 2023-03-18 | Payer: MEDICARE | Attending: Family | Primary: Family

## 2023-03-18 VITALS — BP 110/70 | HR 71 | Temp 97.90000°F | Resp 18 | Ht 75.0 in | Wt 306.0 lb

## 2023-03-18 DIAGNOSIS — I10 Essential (primary) hypertension: Secondary | ICD-10-CM

## 2023-03-18 DIAGNOSIS — Z0000 Encounter for general adult medical examination without abnormal findings: Secondary | ICD-10-CM

## 2023-03-18 LAB — AMB POC URINE, ALBUMIN, SEMIQUANT (3 RESULTS)
Alb/Creat Ratio POC: <30 mg/g
Albumin, Urine POC: 10 mg/L
Creatinine, Urine, POC: 300 mg/dL

## 2023-03-18 LAB — AMB POC HEMOGLOBIN A1C: Hemoglobin A1C, POC: 7 %

## 2023-03-18 LAB — HEMOGLOBIN A1C
Estimated Avg Glucose, External: 156 mg/dL — ABNORMAL HIGH (ref 91–123)
Hemoglobin A1C, External: 7.1 % — ABNORMAL HIGH (ref 4.8–5.6)

## 2023-03-18 MED ORDER — VITAMIN D (ERGOCALCIFEROL) 1.25 MG (50000 UT) PO CAPS
1.25 | ORAL_CAPSULE | ORAL | 1 refills | Status: AC
Start: 2023-03-18 — End: 2023-09-02

## 2023-03-18 NOTE — Assessment & Plan Note (Addendum)
 Orders:    External Referral To Psychiatry

## 2023-03-18 NOTE — Progress Notes (Signed)
 Jordan Weiss (DOB: [birth year]) is a 52 y.o. male patient, here for evaluation of the following chief complaint(s):  Follow-up (Chronic conditions,back pain, pain left rib cage)       ASSESSMENT/PLAN:  Assessment & Plan  Essential hypertension     Stable and at goal continue medications as per       Vitamin D  deficiency  Medication refill    Orders:    vitamin D  (ERGOCALCIFEROL ) 1.25 MG (50000 UT) CAPS capsule; Take 1 capsule by mouth once a week    Schizoaffective disorder, depressive type (HCC)       Orders:    External Referral To Psychiatry    Longstanding persistent atrial fibrillation (HCC)  Patient is managed by cardiology for above diagnosed         Controlled type 2 diabetes mellitus without complication, without long-term current use of insulin (HCC)  Patient is managed diabetes by DNP Lonell         Lumbosacral spondylosis without myelopathy  Patient is seeing orthopedic specialist, upcoming lumbar spine surgery         Pulmonary emphysema, unspecified emphysema type (HCC)  Patient was referred in the past to pulmonology he did not go, recommend he call the pulmonology office to reinstate and make an appointment         High cholesterol            History of gastric bypass  Patient does not see any specialty management for history of gastric bypass at this time         Skin lesion       Orders:    External Referral To Dermatology    Rib pain on left side  Patient refuses x-ray of rib.  Nontoxic in appearance able to breathe well exam stable                      Return in about 6 months (around 09/16/2023).  Recent blood work was ordered for upcoming surgery patient has preop appointment next week and we will reevaluate all this blood work    SUBJECTIVE/OBJECTIVE:    52 year old male presents to the office for chronic condition management review of medications evaluation of health status   patient sees Dana Stallings for his diabetes me in  Patient has a follow-up tomorrow with orthopedic Dr. Brinda he is pending  a lower back surgery and has a preop appointment coming up soon.  Next    Patient did have a past history of having a left partial nephrectomy and was being followed by [facility]rology of [place] .  States he has a follow-up but unsure of when          Patient states he has a history of having to have some skin lesions removed has not seen a dermatologist in a while and has another skin lesion on the tip of his right ear and on his forehead.    Patient states he has pain in the left rib, he states that he leaned over very heavily while doing his work on a car and has had pain since when he still he has no pain but if he takes a deep breath or moves a certain way he gets pain to the specific area anterior left chest.  Denies bruising redness or deformity      Patient has a history of a gastric bypass and mesh hernia repair bowel obstruction with possible bowel repair    Patient states he was seeing  psychiatry in the past has not been in a while used to take lithium which helped with his moods is taking nothing at this time says he was diagnosed with borderline personality disorder and possible bipolar depression    The patient was referred to pulmonology had an appointment due to personal issues could not go recommend the patient follow-up with this appointment and reschedule as soon as possible    Patient had been referred to neurology in the past due to memory issues, patient states he never followed up    Patient states he continues to have bowel issues he states due to his pain medication he has constipation that is chronic.  Patient states he still continues to have a burning pain in his abdomen even after having the EGD and colonoscopy which did not give him any answers to his concern.                                    BP 110/70 (Site: Left Upper Arm, Position: Sitting)   Pulse 71   Temp 97.9 F (36.6 C) (Temporal)   Resp 18   Ht 1.905 m (6' 3)   Wt (!) 138.8 kg (306 lb)   SpO2 98%   BMI 38.25 kg/m         Physical Exam   General:  alert, cooperative, well appearing, in no apparent distress.  Eyes:  Pupils are equally round and reactive to light with accommodation.  The extra-ocular movements are intact.  The lids are without swelling, lesions, or drainage.  The conjunctiva is clear and noninjected.  ENT:    The tongue and mucous membranes are pink and moist without lesions.  The pharynx is non-erythematous without exudates.  Neck:  There is normal range of motion.  The thyroid is normal size without nodules or masses.  There is no lymphadenopathy.  CV:  The heart sounds are regular in rate and rhythm.  There is a normal S1 and S2.  There are no  murmurs, rubs, or gallops.  Distal pulses are intact and equal.  Lungs:   Lung sounds are clear and equal to auscultation throughout all lung fields without wheezing, rales, or rhonchi.  Inspiratory and expiratory efforts are full and unlabored.  GI:  The abdomen is soft and nontender.    Extremities:  There is no clubbing, cyanosis, or edema.  3+ peripheral pulses.cap refill less than 2 seconds   Neurological:    There is no obvious focal sensory or motor deficits.     Tender to palpate anterior rib midclavicular line.  No bruising no deformity no erythema  Skin: Small scabbed flaky area to the tip of the right ear, flat rough skin small patch noted to the forehead    I recommend this patient have regular follow-up appointments for general health evaluations and management, sooner appointments if indicated for acute concerns    This chart was prepared using voice recognition software and may contain unintended word substitution errors    I have discussed the diagnosis with the patient and the intended plan as seen in the above orders.  Questions were answered concerning future plans.  I have discussed medication side effects and warnings with the patient as well. I have reviewed the plan of care with the patient, accepted their input and they are in agreement with the  treatment goals. Previous lab and imaging results were reviewed by me.  On this date 03/18/2023 I have spent 40 minutes minutes reviewing previous notes, test results and face to face with the patient discussing the diagnosis and importance of compliance with the treatment plan as well as documenting on the day of the visit.    An electronic signature was used to authenticate this note.  -- Eleanor CHRISTELLA Double, FNP

## 2023-03-18 NOTE — Progress Notes (Signed)
 Medicare Annual Wellness Visit    Jordan Weiss is here for Medicare AWV       Recommendations for Preventive Services Due: see orders and patient instructions/AVS.  Recommended screening schedule for the next 5-10 years is provided to the patient in written form: see Patient Instructions/AVS.  Assessment & Plan  Medicare annual wellness visit, subsequent            Controlled type 2 diabetes mellitus without complication, without long-term current use of insulin (HCC)     Patient is managed by endocrinology DNP Lonell Loge  Orders:    AMB POC HEMOGLOBIN A1C    COLLECTION CAPILLARY BLOOD SPECIMEN    AMB POC URINE, MICROALBUMIN, SEMIQUANT (3 RESULTS)    Schizoaffective disorder, bipolar type (HCC)     Has not seen psychiatry in a while or where he referred       Longstanding persistent atrial fibrillation [facility])  Sees cardiology         Essential hypertension  Blood pressure stable and at goal today         Lumbosacral spondylosis without myelopathy  Having back surgery in the near future            No follow-ups on file.     Subjective   52 year old male presents for Medicare annual wellness subsequent      Patient's complete Health Risk Assessment and screening values have been reviewed and are found in Flowsheets. The following problems were reviewed today and where indicated follow up appointments were made and/or referrals ordered.    Positive Risk Factor Screenings with Interventions:            Controlled Medication Review:    Today's Pain Level: No data recorded   Opioid Risk: (Low risk score <55) Opioid risk score: 27    Patient is low risk for opioid use disorder or overdose.    Last PDMP [place] as Reviewed:  Review User Review Instant Review Result   ELETHA ELEANOR HERO 03/18/2023 12:59 PM     Reviewed PDMP [1]     Last Controlled Substance Monitoring Documentation      Flowsheet Row Office Visit from 03/18/2023 in [facility]   Periodic Controlled Substance Monitoring Possible  medication side effects, risk of tolerance/dependence & alternative treatments discussed. filed at 03/18/2023 1258            General HRA Questions:  Select all that apply: (!) New or Increased Pain  Interventions - Pain:  The patient is managed by specific pain management provider.  All pain management medications are prescribed by pain management provider      Inactivity:  On average, how many days per week do you engage in moderate to strenuous exercise (like a brisk walk)?: 0 days (!) Abnormal  On average, how many minutes do you engage in exercise at this level?: 0 min  Interventions:  Patient declined any further interventions or treatment     Abnormal BMI (obese):  Body mass index is 38.25 kg/m. (!) Abnormal  Interventions:  Patient declines any further evaluation or treatment        Dentist Screen:  Have you seen the dentist within the past year?: (!) No    Intervention:  Patient comments: Patient's wife states they are crying to get an appointment where their insurance is excepted it is just difficulty getting appointment    Hearing Screen:  Do you or your family notice any trouble with your hearing that  hasn't been managed with hearing aids?: (!) Yes (Needs hearing test)    Interventions:  Patient comments: Patient would like to be referred if needed for hearing aids and evaluation.    Vision Screen:  Do you have difficulty driving, watching TV, or doing any of your daily activities because of your eyesight?: No  Have you had an eye exam within the past year?: (!) No  Interventions:   Patient comments: Patient can go to any vision doctor he would like he just has to schedule appointment    Safety:  Do you have working smoke detectors?: (!) No  Interventions:  Patient declined any further interventions or treatment     Advanced Directives:  Do you have a Living Will?: (!) No  Not want   Intervention:  has NO advanced directive - not interested in additional information                     Objective    Vitals:    03/18/23 1105   BP: 110/70   Site: Left Upper Arm   Position: Sitting   Pulse: 71   Resp: 18   Temp: 97.9 F (36.6 C)   TempSrc: Temporal   SpO2: 98%   Weight: (!) 138.8 kg (306 lb)   Height: 1.905 m (6' 3)      Body mass index is 38.25 kg/m.               Allergies   Allergen Reactions    Heparin Angioedema, Shortness Of Breath, Anaphylaxis and Other (See Comments)    Ambien [Zolpidem] Hallucinations     Prior to Visit Medications    Medication Sig Taking? Authorizing Provider   Glucagon (GVOKE HYPOPEN 2-PACK) 1 MG/0.2ML SOAJ INJECT 1MG  SQ PRN LOW BG  [provider]   vitamin D  (ERGOCALCIFEROL ) 1.25 MG (50000 UT) CAPS capsule Take 1 capsule by mouth once a week  Vedh Ptacek M, FNP   tamsulosin  (FLOMAX ) 0.4 MG capsule TAKE 1 CAPSULE BY MOUTH AFTER SUPPER  Ayianna Darnold M, FNP   omeprazole  (PRILOSEC) 40 MG delayed release capsule Take 1 capsule by mouth once daily  Kiarrah Rausch M, FNP   Insulin Aspart, w/Niacinamide, (FIASP) 100 UNIT/ML SOLN DELIVER UP TO 100 UNITS DAILY VIA INSULIN PUMP E11.65  [provider]   acetaminophen-codeine (TYLENOL #3) 300-30 MG per tablet Take 2 tablets by mouth every 8 hours as needed.  [provider]   TRULICITY 3 MG/0.5ML SOPN Inject 3 mg as directed Once a week at 5 PM  [provider]   fluticasone-salmeterol (ADVAIR DISKUS) 250-50 MCG/ACT AEPB diskus inhaler Inhale 1 puff into the lungs 2 times daily  [provider]   fenofibrate (TRICOR) 145 MG tablet Take 1 tablet by mouth daily  [provider]   FEROSUL 325 (65 Fe) MG tablet Take 1 tablet by mouth daily (with breakfast)  [provider]   vitamin C (ASCORBIC ACID) 500 MG tablet Take 1 tablet by mouth daily  [provider]   albuterol  sulfate HFA (PROVENTIL ;VENTOLIN ;PROAIR ) 108 (90 Base) MCG/ACT inhaler TAKE 1-2 PUFFS INHALED BY MOUTH EVERY 6 HOURS.  Eletha Eleanor HERO, FNP   empagliflozin (JARDIANCE) 10 MG tablet Take 1  tablet by mouth every morning  [provider]   dicyclomine  (BENTYL ) 20 MG tablet TAKE 1 TABLET BY MOUTH 4 TIMES DAILY AS NEEDED  [provider]   aspirin 81 MG chewable tablet Take 1 tablet by mouth daily  [provider]   rivaroxaban (XARELTO) 20 MG TABS tablet Take by mouth  [provider]   atenolol (TENORMIN) 25 MG tablet Take 1 tablet by mouth daily  Automatic Reconciliation, Ar   atorvastatin  (LIPITOR) 20 MG tablet Take 1 tablet by mouth  Automatic Reconciliation, Ar   cyclobenzaprine (FLEXERIL) 10 MG tablet Take 1 tablet by mouth every 8 hours as needed  Automatic Reconciliation, Ar   docusate (COLACE, DULCOLAX) 100 MG CAPS Take 100 mg by mouth daily  Automatic Reconciliation, Ar   enalapril (VASOTEC) 20 MG tablet Take 1 tablet by mouth daily  Automatic Reconciliation, Ar   hydroCHLOROthiazide (HYDRODIURIL) 25 MG tablet Take 1 tablet by mouth daily  Automatic Reconciliation, Ar   nitroGLYCERIN  (NITROSTAT ) 0.4 MG SL tablet Place 1 tablet under the tongue  Automatic Reconciliation, Ar       CareTeam (Including outside providers/suppliers regularly involved in providing care):   Patient Care Team:  Eletha Eleanor HERO, FNP as PCP - General (Nurse Practitioner)  Eletha Eleanor HERO, FNP as PCP - Empaneled Provider     Reviewed and updated this visit:  Tobacco  Allergies  Meds  Problems  Med Hx  Surg Hx  Soc Hx  Fam Hx               Eleanor HERO Eletha, FNP

## 2023-03-18 NOTE — Assessment & Plan Note (Addendum)
 Stable and at goal continue medications as per

## 2023-03-18 NOTE — Assessment & Plan Note (Addendum)
 Patient is managed diabetes by DNP Annabelle Harman

## 2023-03-18 NOTE — Assessment & Plan Note (Addendum)
 Patient is seeing orthopedic specialist, upcoming lumbar spine surgery

## 2023-03-18 NOTE — Assessment & Plan Note (Addendum)
 Patient is managed by cardiology for above diagnosed

## 2023-03-18 NOTE — Progress Notes (Signed)
 Jordan Weiss presents today for   Chief Complaint   Patient presents with    Medicare AWV       Is someone accompanying this pt? yes    Is the patient using any DME equipment during OV? no    Risk Factor Screenings with Interventions     Fall Risk:  2 or more falls in past year?: no  Fall with injury in past year?: no    Alcohol:  Alcohol Use: Not At Risk (03/18/2023)    AUDIT-C     Frequency of Alcohol Consumption: Never     Average Number of Drinks: Patient does not drink     Frequency of Binge Drinking: Never       Depression:  PHQ-2 Score: 0    Cognitive:       Health Risk Assessment:               Body mass index is 38.25 kg/m.                         Health Maintenance reviewed and discussed and ordered per Provider.    Health Maintenance Due   Topic Date Due    Pneumococcal 0-64 years Vaccine (1 of 2 - PCV) Never done    Diabetic foot exam  Never done    HIV screen  Never done    Diabetic Alb to Cr ratio (uACR) test  Never done    Diabetic retinal exam  Never done    Hepatitis C screen  Never done    Hepatitis B vaccine (1 of 3 - 19+ 3-dose series) Never done    Shingles vaccine (1 of 2) Never done    A1C test (Diabetic or Prediabetic)  07/13/2022    Annual Wellness Visit (Medicare)  Never done    Flu vaccine (1) Never done    COVID-19 Vaccine (1 - 2023-24 season) Never done        Coordination of Care:    1. Have you been to the ER, urgent care clinic since your last visit?  Hospitalized since your last visit? [facility] for Trigger finger/back pain a month ago    2. Have you seen or consulted any other health care providers outside of the [facility] since your last visit?   Dana stallings  3. For patients aged 42-75: Has the patient had a colonoscopy / FIT/ Cologuard? Yes - no Care Gap present      If the patient is male:    4. For patients aged 69-74: Has the patient had a mammogram within the past 2 years? NA - based on age or sex      24. For patients aged 21-65: Has the patient had a  pap smear? NA - based on age or sex

## 2023-03-18 NOTE — Assessment & Plan Note (Addendum)
 Sees cardiology.

## 2023-03-18 NOTE — Assessment & Plan Note (Addendum)
 Blood pressure stable and at goal today

## 2023-03-18 NOTE — Assessment & Plan Note (Addendum)
 Patient is managed by endocrinology DNP Lenoria Farrier  Orders:    AMB POC HEMOGLOBIN A1C    COLLECTION CAPILLARY BLOOD SPECIMEN    AMB POC URINE, MICROALBUMIN, SEMIQUANT (3 RESULTS)

## 2023-03-18 NOTE — Assessment & Plan Note (Addendum)
 Having back surgery in the near future

## 2023-03-18 NOTE — Progress Notes (Signed)
 Chief Complaint   Patient presents with    Follow-up     Chronic conditions,back pain, pain left rib cage       Have you been to the ER, urgent care clinic since your last visit?  Hospitalized since your last visit?    To [facility] ER for trigger finger/back pain    "Have you seen or consulted any other health care providers outside of [facility] since your last visit?"    Lonell loge

## 2023-03-18 NOTE — Assessment & Plan Note (Addendum)
 Has not seen psychiatry in a while or where he referred

## 2023-03-19 ENCOUNTER — Encounter

## 2023-03-19 NOTE — Unmapped External Note (Signed)
 [facility]      Pre-operative Cardiac Risk Assessment      Pre-operative Cardiac Risk Assessment is requested by: [facility]; Dr.B.Brinda; PH# [phone]; Fax [phone]     Pre-operative Cardiac Risk Assessment is requested for : Left SI joint fusion at [facility] on 04/07/2023     Cardiac specific concerns include: CAD, persistent atrial fib       The patient was last seen by me: 02/10/2023      Medication changes:     The patient may hold Xarelto for 48 hours     Please instruct patient to resume above medications as soon as possible based on the outcomes of the procedure performed.  As the consulting provider not performing the procedure and with no expertise in field, I can only recommend that the medications be resumed as early as possible; the primary operator must take into account the outcomes and complexity of the procedure that was performed.         At that time the patient was last seen by me, there were no absolute contraindications.     I would consider the patient at LOW risk for the procedure planned, understanding that my specific knowledge of the risks of the planned surgery, estimated blood loss and physiologic stress to the patient and the availability and competency of perioperative monitoring, moderate or deep sedation skills and staff ACLS training is completely unknown to me based on the clearance request documents.       Further, this assessment is limited by any changes in the patient's status, medical regimen, systems review or physical exam since last seen by me. An updated assessment by the operator/surgeon and anesthesia care provider is, of course, recommended.    Any cardiac issues raised at that time should be routed back to me office for review.       Finally, the decision to proceed with the surgery comes down to the risk vs benefit to the patient, which must be discussed between the patient and the operator as part of the informed consent process.     Total  time spent reviewing the chart, assessing patient's risks, and making medical decisions: 10 minutes.       Respectfully,      Rosina Ester, MSN, NP-C  [facility]  [address]  [place], [place]  [address]  Main: [phone] Fax# [phone]    03/19/2023, 2:28 PM

## 2023-03-19 NOTE — Progress Notes (Signed)
 Refer to GI for further evaluation of chronic abdominal pain, evaluation after EGD

## 2023-03-23 ENCOUNTER — Encounter: Admit: 2023-03-23 | Payer: MEDICARE | Attending: Family | Primary: Family

## 2023-03-23 DIAGNOSIS — Z01818 Encounter for other preprocedural examination: Secondary | ICD-10-CM

## 2023-03-23 NOTE — Progress Notes (Signed)
PREOPERATIVE EVALUATION    Jordan Weiss (DOB: 06/30/1970) is a 52 y.o. male here for evaluation of the following chief concerns(s):  Other (Preoperative clearance)       ASSESSMENT/PLAN:  1. Preop examination  2. Longstanding persistent atrial fibrillation (HCC)  3. Essential hypertension  4. Controlled type 2 diabetes mellitus without complication, without long-term current use of insulin (HCC)  Chest x-ray March 17, 2023 no evidence of acute pulmonary disease  Below lab testing March 17, 2023:  MSSA/MRSA screening negative  EKG heart rate 64 atrial fib, patient has clearance and record from cardiology  CBC stable  PT 11.4  INR 1.03  Creatinine 0.7  Hemoglobin A1c 7.1      Preoperative cardiac clearance by cardiology: Instructions from cardiology patient hold Xarelto for 48 hours    I am deferring recommendations for insulin pump use to endocrinology that manages this patient's insulin and diabetic medications, Lenoria Farrier, DNP per pt appt next week           SUBJECTIVE/OBJECTIVE:  Patient presents for preoperative evaluation   Surgery is left SI joint fusion  Date 10/23   Surgeon/location at [facility], surgeon dr fox     Cardiology clearance with chart  Patient's diabetic medications/insulin pump are managed by Lenoria Farrier, DNP patient has an appointment next week with Lenoria Farrier for further discussion about insulin management and pump for surgery    Hx CAD:  MI   Hx CHF: No   Hx valvular disease: No  Hx Pulmonary disease: No definitive diagnosis possible COPD  Hx T2DM: yes  Hx HNT: yes  OSA:  still pending sleep study in workup at this time  Tobacco use: quit 2 years ago   Heavy alcohol use: No    Previous history of anesthesia and tolerated well.       DASI 50.7, which is a METS equivalent of 8.97    Dana next week for pump   Clearance is done [employer]  High risk surgery no  CAD yes  CHF no  Hx CVA no   Insulin treatment for diabetes: Yes, on a pump.  Preoperative serum creatinine >2 no    Total    2 = class 3 risk (6.6%) Risk of major cardiac event pt is cleared by cardiology       Denies fever/chills, chest pain, SOB, abdominal pain, leg swelling    Past Medical History:   Diagnosis Date    CAD (coronary artery disease)     Cancer (HCC)     kidney    Chronic pain     DDD (degenerative disc disease), lumbar     Diabetes (HCC)     Hypertension     Melanoma (HCC)     [place]    OA (osteoarthritis) of knee     Renal lesion      Past Surgical History:   Procedure Laterality Date    BACK SURGERY  07/2019    COLONOSCOPY N/A 09/16/2022    COLONOSCOPY with polypectomy performed by Marvis Repress, MD at [facility] ENDOSCOPY    GASTRIC BYPASS SURGERY      GASTRIC BYPASS SURGERY  2005    HERNIA REPAIR  2015    ORTHOPEDIC SURGERY  2013    fractured back    OTHER SURGICAL HISTORY Left 1989    degloved injury    OTHER SURGICAL HISTORY Bilateral 2012, 2014    Rotator cuff    SHOULDER ARTHROSCOPY  rotator cuff repair x 2    UPPER GASTROINTESTINAL ENDOSCOPY N/A 09/16/2022    ESOPHAGOGASTRODUODENOSCOPY performed by Marvis Repress, MD at [facility] ENDOSCOPY     Family History   Problem Relation Age of Onset    Diabetes Mother     Cancer Mother         breast    Hypertension Brother     Heart Attack Brother     Heart Attack Brother     Hypertension Brother     Stroke Brother     Diabetes Brother     Hypertension Sister     Diabetes Brother     Hypertension Brother     Kidney Disease Brother         had transplant    Hypertension Sister     Heart Failure Sister     Cancer Sister         cervical cancer    Osteoporosis Sister     Hypertension Sister     Diabetes Sister     Stroke Father     Diabetes Father     Heart Disease Mother     Cancer Father         lung       Social History     Socioeconomic History    Marital status: Married     Spouse name: None    Number of children: None    Years of education: None    Highest education level: None   Tobacco Use    Smoking status: Former    Smokeless tobacco: Never   Advertising account planner    Vaping  status: Former   Substance and Sexual Activity    Alcohol use: Not Currently     Alcohol/week: 0.0 standard drinks of alcohol    Drug use: No     Social Determinants of Psychologist, prison and probation services Strain: Low Risk  (08/19/2022)    Overall Financial Resource Strain (CARDIA)     Difficulty of Paying Living Expenses: Not hard at all   Food Insecurity: No Food Insecurity (08/19/2022)    Hunger Vital Sign     Worried About Running Out of Food in the Last Year: Never true     Ran Out of Food in the Last Year: Never true   Transportation Needs: Unknown (08/19/2022)    PRAPARE - Transportation     Lack of Transportation (Non-Medical): No   Physical Activity: Inactive (03/18/2023)    Exercise Vital Sign     Days of Exercise per Week: 0 days     Minutes of Exercise per Session: 0 min   Housing Stability: Unknown (08/19/2022)    Housing Stability Vital Sign     Unstable Housing in the Last Year: No     Social History     Tobacco Use   Smoking Status Former   Smokeless Tobacco Never       Current Outpatient Medications   Medication Sig Dispense Refill    Glucagon (GVOKE HYPOPEN 2-PACK) 1 MG/0.2ML SOAJ INJECT 1MG  SQ PRN LOW BG      vitamin D (ERGOCALCIFEROL) 1.25 MG (50000 UT) CAPS capsule Take 1 capsule by mouth once a week 12 capsule 1    tamsulosin (FLOMAX) 0.4 MG capsule TAKE 1 CAPSULE BY MOUTH AFTER SUPPER 90 capsule 0    omeprazole (PRILOSEC) 40 MG delayed release capsule Take 1 capsule by mouth once daily 90 capsule 0    Insulin Aspart, w/Niacinamide, (FIASP)  100 UNIT/ML SOLN DELIVER UP TO 100 UNITS DAILY VIA INSULIN PUMP E11.65      acetaminophen-codeine (TYLENOL #3) 300-30 MG per tablet Take 2 tablets by mouth every 8 hours as needed.      TRULICITY 3 MG/0.5ML SOPN Inject 3 mg as directed Once a week at 5 PM      fluticasone-salmeterol (ADVAIR DISKUS) 250-50 MCG/ACT AEPB diskus inhaler Inhale 1 puff into the lungs 2 times daily      fenofibrate (TRICOR) 145 MG tablet Take 1 tablet by mouth daily      FEROSUL 325 (65 Fe) MG  tablet Take 1 tablet by mouth daily (with breakfast)      vitamin C (ASCORBIC ACID) 500 MG tablet Take 1 tablet by mouth daily      albuterol sulfate HFA (PROVENTIL;VENTOLIN;PROAIR) 108 (90 Base) MCG/ACT inhaler TAKE 1-2 PUFFS INHALED BY MOUTH EVERY 6 HOURS. 18 g 1    dicyclomine (BENTYL) 20 MG tablet TAKE 1 TABLET BY MOUTH 4 TIMES DAILY AS NEEDED      aspirin 81 MG chewable tablet Take 1 tablet by mouth daily      rivaroxaban (XARELTO) 20 MG TABS tablet Take by mouth      atenolol (TENORMIN) 25 MG tablet Take 1 tablet by mouth daily      atorvastatin (LIPITOR) 20 MG tablet Take 1 tablet by mouth      cyclobenzaprine (FLEXERIL) 10 MG tablet Take 1 tablet by mouth every 8 hours as needed      docusate (COLACE, DULCOLAX) 100 MG CAPS Take 100 mg by mouth daily      enalapril (VASOTEC) 20 MG tablet Take 1 tablet by mouth daily      hydroCHLOROthiazide (HYDRODIURIL) 25 MG tablet Take 1 tablet by mouth daily      nitroGLYCERIN (NITROSTAT) 0.4 MG SL tablet Place 1 tablet under the tongue       No current facility-administered medications for this visit.     Allergies   Allergen Reactions    Heparin Angioedema, Shortness Of Breath, Anaphylaxis and Other (See Comments)    Ambien [Zolpidem] Hallucinations       BP 100/70 (Site: Left Upper Arm, Position: Sitting)   Pulse 74   Temp 97.8 F (36.6 C) (Temporal)   Resp 18   Ht 1.905 m (6\' 3" )   Wt (!) 137.9 kg (304 lb)   SpO2 98%   BMI 38.00 kg/m      Gen: NAD, well appearing   Heart: Irregularly irregular rate and rhythm , no m/g/r  Lungs: CTA bilaterally, no wheezing, breathing comfortably  Abd: Soft, non tender to palpation  Ext: No swelling  Psych: cooperative. Appropriate mood and affect.    Lab Results   Component Value Date/Time    WBC 7.2 09/10/2022 12:00 AM    HGB 14.5 09/10/2022 12:00 AM    HCT 43.2 09/10/2022 12:00 AM    PLT 190 09/10/2022 12:00 AM    MCV 83 09/10/2022 12:00 AM     Lab Results   Component Value Date/Time    NA 143 09/10/2022 12:00 AM    K 4.4  09/10/2022 12:00 AM    CL 100 09/10/2022 12:00 AM    CO2 25 09/10/2022 12:00 AM    BUN 19 09/10/2022 12:00 AM    GFRAA 126 12/28/2019 05:07 PM    GLOB 4.4 02/19/2022 07:50 AM    ALT 12 09/10/2022 12:00 AM    AST 27 09/10/2022 12:00 AM  Lab Results   Component Value Date/Time    CHOL 131 09/10/2022 12:00 AM    HDL 46 09/10/2022 12:00 AM    LDL 58 09/10/2022 12:00 AM    VLDL 27 09/10/2022 12:00 AM       On this date 03/23/23 I have spent 30 minutes reviewing previous notes, test results and face to face with the patient for interview/exam, discussing working diagnosis and treatment plan as well as documenting on the day of the visit.     Medical decision making complexity: moderate-high    I have discussed the diagnosis with the patient and the intended plan as seen in the above orders.  The patient has received an after-visit summary and questions were answered concerning future plans.  I have discussed medication side effects and warnings with the patient as well. I have reviewed the plan of care with the patient, accepted their input and they are in agreement with the treatment goals. Previous lab and imaging results were reviewed by me.     Drake Leach, FNP   Family Medicine

## 2023-03-23 NOTE — Progress Notes (Signed)
Chief Complaint   Patient presents with    Other     Preoperative clearance        "Have you been to the ER, urgent care clinic since your last visit?  Hospitalized since your last visit?"    NO    "Have you seen or consulted any other health care providers outside of [facility] since your last visit?"    NO

## 2023-03-25 NOTE — Telephone Encounter (Signed)
Surgery called requesting if pt was cleared for pre op appointment - I couldn't really tell if he was and just wanted to double check with  Melissa. Please advise     Office number : [phone] ext [phone] or fax pre op notes etc to fax # [phone]

## 2023-03-26 NOTE — Telephone Encounter (Signed)
Preop clearance and office notes faxed to [phone].

## 2023-03-30 ENCOUNTER — Encounter

## 2023-03-30 MED ORDER — OMEPRAZOLE 40 MG PO CPDR
40 MG | ORAL_CAPSULE | Freq: Every day | ORAL | 0 refills | Status: AC
Start: 2023-03-30 — End: 2023-06-24

## 2023-03-30 NOTE — Telephone Encounter (Signed)
Per NP Kerrick okay to refill.

## 2023-04-10 ENCOUNTER — Encounter

## 2023-04-10 MED ORDER — ATORVASTATIN CALCIUM 20 MG PO TABS
20 MG | ORAL_TABLET | Freq: Every day | ORAL | 0 refills | Status: AC
Start: 2023-04-10 — End: 2023-05-11

## 2023-04-10 MED ORDER — DICYCLOMINE HCL 20 MG PO TABS
20 MG | ORAL_TABLET | Freq: Four times a day (QID) | ORAL | 0 refills | Status: AC | PRN
Start: 2023-04-10 — End: 2023-04-27

## 2023-04-10 MED ORDER — TAMSULOSIN HCL 0.4 MG PO CAPS
0.4 MG | ORAL_CAPSULE | ORAL | 0 refills | Status: AC
Start: 2023-04-10 — End: 2023-07-10

## 2023-04-10 NOTE — Telephone Encounter (Signed)
 Per NP Kerrick okay to refill.

## 2023-04-10 NOTE — Telephone Encounter (Signed)
Pt needs refill -confirmed [facility] - may not be able to fill dicyclomine due to another provider prescribing it last. Please advise

## 2023-04-27 MED ORDER — DICYCLOMINE HCL 20 MG PO TABS
20 MG | ORAL_TABLET | ORAL | 0 refills | Status: AC
Start: 2023-04-27 — End: 2023-05-18

## 2023-04-27 NOTE — Telephone Encounter (Signed)
 Per NP Kerrick okay to refill.

## 2023-05-11 MED ORDER — ATORVASTATIN CALCIUM 20 MG PO TABS
20 MG | ORAL_TABLET | Freq: Every day | ORAL | 0 refills | Status: DC
Start: 2023-05-11 — End: 2023-08-07

## 2023-05-11 NOTE — Telephone Encounter (Signed)
 Per NP Kerrick okay to refill.

## 2023-05-18 MED ORDER — DICYCLOMINE HCL 20 MG PO TABS
20 | ORAL_TABLET | ORAL | 0 refills | Status: AC
Start: 2023-05-18 — End: ?

## 2023-05-18 NOTE — Telephone Encounter (Signed)
 Per NP Kerrick okay to refill.

## 2023-06-24 ENCOUNTER — Encounter

## 2023-06-24 MED ORDER — OMEPRAZOLE 40 MG PO CPDR
40 | ORAL_CAPSULE | Freq: Every day | ORAL | 1 refills | Status: DC
Start: 2023-06-24 — End: 2023-12-15

## 2023-06-24 NOTE — Telephone Encounter (Signed)
 Per NP Kerrick okay to refill.

## 2023-07-10 ENCOUNTER — Encounter

## 2023-07-10 MED ORDER — TAMSULOSIN HCL 0.4 MG PO CAPS
0.4 MG | ORAL_CAPSULE | ORAL | 0 refills | Status: DC
Start: 2023-07-10 — End: 2023-10-20

## 2023-07-10 NOTE — Telephone Encounter (Signed)
Per NP Kerrick okay to refill.

## 2023-07-21 NOTE — Telephone Encounter (Signed)
-----   Message from [place] B sent at 07/21/2023 10:01 AM EST -----  Regarding: E[facility] Referral Request  E[facility] Referral Request    Reason for referral request: Specialty Provider    Specialist/Lab/Test patient is requesting (if known):specialist     Specialist Phone Number (if applicable):    Additional Information Patient wants to have an referral for an Endocrinologist.  --------------------------------------------------------------------------------------------------------------------------    Relationship to Patient: Spouse/Partner Wife     Call Back Information: OK to leave message on voicemail  Preferred Call Back Number: Phone [phone]

## 2023-07-21 NOTE — Telephone Encounter (Signed)
 Forwarded to provider.

## 2023-07-22 ENCOUNTER — Encounter

## 2023-07-23 NOTE — Telephone Encounter (Signed)
Pt is aware and give number to [facility] office for endo referral

## 2023-08-07 MED ORDER — ATORVASTATIN CALCIUM 20 MG PO TABS
20 | ORAL_TABLET | Freq: Every day | ORAL | 0 refills | 30.00000 days | Status: DC
Start: 2023-08-07 — End: 2023-11-06

## 2023-08-07 NOTE — Telephone Encounter (Signed)
 Per NP Kerrick okay to refill.

## 2023-10-18 ENCOUNTER — Encounter

## 2023-10-20 MED ORDER — TAMSULOSIN HCL 0.4 MG PO CAPS
0.4 | ORAL_CAPSULE | ORAL | 0 refills | 30.00000 days | Status: AC
Start: 2023-10-20 — End: ?

## 2023-10-20 NOTE — Telephone Encounter (Signed)
 Per NP Kerrick okay to refill.

## 2023-11-06 MED ORDER — ATORVASTATIN CALCIUM 20 MG PO TABS
20 | ORAL_TABLET | Freq: Every day | ORAL | 0 refills | 30.00000 days | Status: AC
Start: 2023-11-06 — End: ?

## 2023-11-06 NOTE — Telephone Encounter (Signed)
 Per NP Kerrick okay to refill.

## 2023-12-10 LAB — HEMOGLOBIN A1C, EXTERNAL: Hemoglobin A1C, External: 7.5 %

## 2023-12-15 ENCOUNTER — Encounter

## 2023-12-15 MED ORDER — OMEPRAZOLE 40 MG PO CPDR
40 | ORAL_CAPSULE | Freq: Every day | ORAL | 0 refills | Status: AC
Start: 2023-12-15 — End: ?

## 2023-12-15 NOTE — Telephone Encounter (Signed)
 Per NP Kerrick okay to refill.

## 2024-03-21 ENCOUNTER — Encounter: Payer: MEDICARE | Attending: Family | Primary: Family

## 2024-04-07 ENCOUNTER — Encounter

## 2024-04-08 ENCOUNTER — Encounter

## 2024-05-03 ENCOUNTER — Encounter
